# Patient Record
Sex: Female | Born: 1960 | Race: Black or African American | Hispanic: No | Smoking: Former smoker
Health system: Southern US, Community
[De-identification: ages and names within clinical notes are randomized; demographics above are authoritative.]

## PROBLEM LIST (undated history)

## (undated) DIAGNOSIS — B009 Herpesviral infection, unspecified: Secondary | ICD-10-CM

## (undated) DIAGNOSIS — Z8711 Personal history of peptic ulcer disease: Secondary | ICD-10-CM

## (undated) DIAGNOSIS — I10 Essential (primary) hypertension: Secondary | ICD-10-CM

## (undated) DIAGNOSIS — K219 Gastro-esophageal reflux disease without esophagitis: Secondary | ICD-10-CM

## (undated) DIAGNOSIS — R202 Paresthesia of skin: Secondary | ICD-10-CM

## (undated) DIAGNOSIS — Z5181 Encounter for therapeutic drug level monitoring: Secondary | ICD-10-CM

## (undated) DIAGNOSIS — L309 Dermatitis, unspecified: Secondary | ICD-10-CM

## (undated) DIAGNOSIS — Z8719 Personal history of other diseases of the digestive system: Secondary | ICD-10-CM

## (undated) DIAGNOSIS — N951 Menopausal and female climacteric states: Secondary | ICD-10-CM

## (undated) DIAGNOSIS — E119 Type 2 diabetes mellitus without complications: Secondary | ICD-10-CM

## (undated) DIAGNOSIS — G43009 Migraine without aura, not intractable, without status migrainosus: Secondary | ICD-10-CM

## (undated) DIAGNOSIS — T7840XA Allergy, unspecified, initial encounter: Secondary | ICD-10-CM

## (undated) DIAGNOSIS — Z91030 Bee allergy status: Secondary | ICD-10-CM

## (undated) DIAGNOSIS — G4761 Periodic limb movement disorder: Secondary | ICD-10-CM

## (undated) DIAGNOSIS — G4733 Obstructive sleep apnea (adult) (pediatric): Secondary | ICD-10-CM

## (undated) DIAGNOSIS — K5904 Chronic idiopathic constipation: Secondary | ICD-10-CM

## (undated) DIAGNOSIS — E785 Hyperlipidemia, unspecified: Secondary | ICD-10-CM

## (undated) DIAGNOSIS — F5104 Psychophysiologic insomnia: Secondary | ICD-10-CM

## (undated) DIAGNOSIS — M25569 Pain in unspecified knee: Secondary | ICD-10-CM

## (undated) DIAGNOSIS — E669 Obesity, unspecified: Secondary | ICD-10-CM

## (undated) DIAGNOSIS — M25473 Effusion, unspecified ankle: Secondary | ICD-10-CM

## (undated) HISTORY — DX: Herpesviral infection, unspecified: B00.9

## (undated) HISTORY — DX: Personal history of peptic ulcer disease: Z87.11

## (undated) HISTORY — PX: RETINAL TEAR REPAIR CRYOTHERAPY: SHX5304

## (undated) HISTORY — DX: Essential (primary) hypertension: I10

## (undated) HISTORY — PX: ABDOMINAL HYSTERECTOMY: SHX81

## (undated) HISTORY — DX: Dermatitis, unspecified: L30.9

## (undated) HISTORY — DX: Psychophysiologic insomnia: F51.04

## (undated) HISTORY — DX: Effusion, unspecified ankle: M25.473

## (undated) HISTORY — DX: Bee allergy status: Z91.030

## (undated) HISTORY — DX: Hyperlipidemia, unspecified: E78.5

## (undated) HISTORY — DX: Obesity, unspecified: E66.9

## (undated) HISTORY — DX: Obstructive sleep apnea (adult) (pediatric): G47.33

## (undated) HISTORY — DX: Type 2 diabetes mellitus without complications: E11.9

## (undated) HISTORY — DX: Allergy, unspecified, initial encounter: T78.40XA

## (undated) HISTORY — DX: Chronic idiopathic constipation: K59.04

## (undated) HISTORY — DX: Pain in unspecified knee: M25.569

## (undated) HISTORY — DX: Personal history of other diseases of the digestive system: Z87.19

## (undated) HISTORY — DX: Migraine without aura, not intractable, without status migrainosus: G43.009

## (undated) HISTORY — DX: Paresthesia of skin: R20.2

## (undated) HISTORY — DX: Menopausal and female climacteric states: N95.1

## (undated) HISTORY — DX: Encounter for therapeutic drug level monitoring: Z51.81

## (undated) HISTORY — DX: Gastro-esophageal reflux disease without esophagitis: K21.9

## (undated) HISTORY — DX: Periodic limb movement disorder: G47.61

---

## 2005-10-28 ENCOUNTER — Emergency Department: Payer: Self-pay | Admitting: Emergency Medicine

## 2005-12-25 ENCOUNTER — Ambulatory Visit: Payer: Self-pay | Admitting: Family Medicine

## 2007-03-05 ENCOUNTER — Ambulatory Visit: Payer: Self-pay | Admitting: Family Medicine

## 2007-03-11 ENCOUNTER — Ambulatory Visit: Payer: Self-pay | Admitting: Family Medicine

## 2008-10-11 ENCOUNTER — Ambulatory Visit: Payer: Self-pay | Admitting: Family Medicine

## 2009-12-27 LAB — HM COLONOSCOPY

## 2010-01-15 ENCOUNTER — Ambulatory Visit: Payer: Self-pay | Admitting: Gastroenterology

## 2011-02-08 ENCOUNTER — Ambulatory Visit: Payer: Self-pay | Admitting: Family Medicine

## 2012-07-10 ENCOUNTER — Ambulatory Visit: Payer: Self-pay | Admitting: Family Medicine

## 2012-10-23 ENCOUNTER — Ambulatory Visit: Payer: Self-pay | Admitting: Family Medicine

## 2012-11-17 ENCOUNTER — Ambulatory Visit: Payer: Self-pay | Admitting: Family Medicine

## 2012-11-18 LAB — HM MAMMOGRAPHY: HM Mammogram: NORMAL

## 2013-01-25 ENCOUNTER — Ambulatory Visit: Payer: Self-pay | Admitting: Family Medicine

## 2013-01-26 ENCOUNTER — Ambulatory Visit: Payer: Self-pay | Admitting: Family Medicine

## 2013-02-26 ENCOUNTER — Ambulatory Visit: Payer: Self-pay | Admitting: Family Medicine

## 2013-11-26 LAB — HM DIABETES EYE EXAM

## 2014-11-18 ENCOUNTER — Ambulatory Visit
Admit: 2014-11-18 | Disposition: A | Discharge status: Home / Self Care | Payer: Self-pay | Attending: Family Medicine | Admitting: Family Medicine

## 2014-11-18 LAB — HEMOGLOBIN A1C: Hgb A1c MFr Bld: 8.8 % — AB (ref 4.0–6.0)

## 2015-02-20 ENCOUNTER — Encounter: Payer: Self-pay | Admitting: Family Medicine

## 2015-02-20 DIAGNOSIS — G4700 Insomnia, unspecified: Secondary | ICD-10-CM | POA: Insufficient documentation

## 2015-02-20 DIAGNOSIS — L309 Dermatitis, unspecified: Secondary | ICD-10-CM | POA: Insufficient documentation

## 2015-02-20 DIAGNOSIS — E785 Hyperlipidemia, unspecified: Secondary | ICD-10-CM | POA: Insufficient documentation

## 2015-02-20 DIAGNOSIS — I10 Essential (primary) hypertension: Secondary | ICD-10-CM | POA: Insufficient documentation

## 2015-02-20 DIAGNOSIS — G43009 Migraine without aura, not intractable, without status migrainosus: Secondary | ICD-10-CM | POA: Insufficient documentation

## 2015-02-20 DIAGNOSIS — R202 Paresthesia of skin: Secondary | ICD-10-CM | POA: Insufficient documentation

## 2015-02-20 DIAGNOSIS — N951 Menopausal and female climacteric states: Secondary | ICD-10-CM | POA: Insufficient documentation

## 2015-02-20 DIAGNOSIS — J302 Other seasonal allergic rhinitis: Secondary | ICD-10-CM | POA: Insufficient documentation

## 2015-02-20 DIAGNOSIS — Z8719 Personal history of other diseases of the digestive system: Secondary | ICD-10-CM | POA: Insufficient documentation

## 2015-02-20 DIAGNOSIS — G4761 Periodic limb movement disorder: Secondary | ICD-10-CM | POA: Insufficient documentation

## 2015-02-20 DIAGNOSIS — Z8711 Personal history of peptic ulcer disease: Secondary | ICD-10-CM | POA: Insufficient documentation

## 2015-02-20 DIAGNOSIS — G4733 Obstructive sleep apnea (adult) (pediatric): Secondary | ICD-10-CM | POA: Insufficient documentation

## 2015-02-20 DIAGNOSIS — Z91030 Bee allergy status: Secondary | ICD-10-CM | POA: Insufficient documentation

## 2015-02-20 DIAGNOSIS — K5904 Chronic idiopathic constipation: Secondary | ICD-10-CM | POA: Insufficient documentation

## 2015-02-20 DIAGNOSIS — B009 Herpesviral infection, unspecified: Secondary | ICD-10-CM | POA: Insufficient documentation

## 2015-03-03 ENCOUNTER — Encounter: Payer: Self-pay | Admitting: Family Medicine

## 2015-03-03 ENCOUNTER — Encounter (INDEPENDENT_AMBULATORY_CARE_PROVIDER_SITE_OTHER): Payer: Self-pay

## 2015-03-03 ENCOUNTER — Ambulatory Visit (INDEPENDENT_AMBULATORY_CARE_PROVIDER_SITE_OTHER): Payer: 59 | Admitting: Family Medicine

## 2015-03-03 VITALS — BP 118/76 | HR 82 | Temp 98.1°F | Resp 14 | Ht 66.0 in | Wt 212.9 lb

## 2015-03-03 DIAGNOSIS — E668 Other obesity: Secondary | ICD-10-CM

## 2015-03-03 DIAGNOSIS — N951 Menopausal and female climacteric states: Secondary | ICD-10-CM | POA: Diagnosis not present

## 2015-03-03 DIAGNOSIS — G4733 Obstructive sleep apnea (adult) (pediatric): Secondary | ICD-10-CM

## 2015-03-03 DIAGNOSIS — G629 Polyneuropathy, unspecified: Secondary | ICD-10-CM

## 2015-03-03 DIAGNOSIS — E1142 Type 2 diabetes mellitus with diabetic polyneuropathy: Secondary | ICD-10-CM

## 2015-03-03 DIAGNOSIS — I10 Essential (primary) hypertension: Secondary | ICD-10-CM

## 2015-03-03 DIAGNOSIS — IMO0002 Reserved for concepts with insufficient information to code with codable children: Secondary | ICD-10-CM

## 2015-03-03 DIAGNOSIS — K5900 Constipation, unspecified: Secondary | ICD-10-CM

## 2015-03-03 DIAGNOSIS — K5904 Chronic idiopathic constipation: Secondary | ICD-10-CM

## 2015-03-03 DIAGNOSIS — G4700 Insomnia, unspecified: Secondary | ICD-10-CM | POA: Diagnosis not present

## 2015-03-03 DIAGNOSIS — E785 Hyperlipidemia, unspecified: Secondary | ICD-10-CM

## 2015-03-03 LAB — POCT GLYCOSYLATED HEMOGLOBIN (HGB A1C): Hemoglobin A1C: 7.7

## 2015-03-03 LAB — POCT UA - MICROALBUMIN: Microalbumin Ur, POC: 20 mg/L

## 2015-03-03 MED ORDER — METFORMIN HCL 1000 MG PO TABS: 1000.0000 mg | ORAL_TABLET | Freq: Two times a day (BID) | ORAL | Status: DC

## 2015-03-03 MED ORDER — IRBESARTAN-HYDROCHLOROTHIAZIDE 300-12.5 MG PO TABS: 1.0000 | ORAL_TABLET | Freq: Every day | ORAL | Status: DC

## 2015-03-03 MED ORDER — LIRAGLUTIDE 18 MG/3ML ~~LOC~~ SOPN: 1.2000 mg | PEN_INJECTOR | Freq: Every day | SUBCUTANEOUS | Status: DC

## 2015-03-03 MED ORDER — SERTRALINE HCL 50 MG PO TABS: 50.0000 mg | ORAL_TABLET | Freq: Every day | ORAL | Status: DC

## 2015-03-03 MED ORDER — POLYETHYLENE GLYCOL 3350 17 GM/SCOOP PO POWD: 17.0000 g | Freq: Two times a day (BID) | ORAL | Status: DC | PRN

## 2015-03-03 MED ORDER — AMLODIPINE BESYLATE 2.5 MG PO TABS: 2.5000 mg | ORAL_TABLET | ORAL | Status: DC | PRN

## 2015-03-03 MED ORDER — TEMAZEPAM 30 MG PO CAPS: 30.0000 mg | ORAL_CAPSULE | Freq: Every day | ORAL | Status: DC

## 2015-03-03 MED ORDER — ATORVASTATIN CALCIUM 40 MG PO TABS: 40.0000 mg | ORAL_TABLET | Freq: Every day | ORAL | Status: DC

## 2015-03-03 NOTE — Progress Notes (Signed)
Name: Joyce Blackburn   MRN: [id number]    DOB: 01-10-1961   Date:03/03/2015       Progress Note  Subjective  Chief Complaint  Chief Complaint  Patient presents with  . Medication Refill  . Diabetes    Checks BS once daily Low-135 High-185 (pt not currently taking jardiance b/c needs mail order rx also states she broke out in a rash)  . Hypertension  . Hyperlipidemia    HPI  DMII with peripheral neuropathy: hgbA1C is down from 8.8 to 7.7, she has been more compliant with her diet, taking medications except for Jardiance because it was causing a side effect ( a bump and sometimes a bruise ), she has intermittent pain on her feet and paresthesias but not daily and does not want to take medications for it.  She denies polyphagia , polydipsia or polyuria.   HTN: taking medications as prescribed, gets dizzy only when having her hair washed at a salon, otherwise no side effects, no chest pain or palpation.  OSA: she states she uses CPAP every night, all night and denies waking up with headache or interrupted sleep.   Hyperlipidemia: taking Atorvastatin and denies side effects  Constipation: she states that over the past month has noticed diarrhea after meals, that is a change in bowel movements, no blood in stools or mucus, occasionally has cramping , she is up to date with her colonoscopy and negative for polyps.   Insomnia: taking Temazepam and symptoms have been controlled  Menopausal symptoms: started on Zoloft 50mg  and initially was feeling very hungry, but is doing well now, mood is better controlled also improved her hot flashes  Obesity: she has decreased soda intake, cutting down on carbohydrates, but still not exercising, she tried going up on dose of Victoza but could not tolerate.   Patient Active Problem List   Diagnosis Date Noted  . Diabetic polyneuropathy associated with type 2 diabetes mellitus 03/03/2015  . Peripheral neuropathy 03/03/2015  . Allergic to bees 02/20/2015   . Benign hypertension 02/20/2015  . Insomnia, persistent 02/20/2015  . Chronic idiopathic constipation 02/20/2015  . Dyslipidemia 02/20/2015  . Dermatitis, eczematoid 02/20/2015  . Gastro-esophageal reflux disease without esophagitis 02/20/2015  . H/O gastric ulcer 02/20/2015  . Herpes 02/20/2015  . Migraine without aura and responsive to treatment 02/20/2015  . Adult BMI 30+ 02/20/2015  . Obstructive apnea 02/20/2015  . Paresthesia of arm 02/20/2015  . Periodic limb movement 02/20/2015  . Allergic rhinitis, seasonal 02/20/2015  . Menopausal symptom 02/20/2015    Past Surgical History  Procedure Laterality Date  . Abdominal hysterectomy      Family History  Problem Relation Age of Onset  . Mental illness Mother   . Diabetes Mother   . CVA Mother   . Schizophrenia Mother   . Diabetes Sister   . Hypertension Sister   . Diabetes Brother   . Mental illness Brother   . Bipolar disorder Brother   . Schizophrenia Brother     History   Social History  . Marital Status: Married    Spouse Name: N/A  . Number of Children: N/A  . Years of Education: N/A   Occupational History  . Not on file.   Social History Main Topics  . Smoking status: Former Smoker -- 15 years    Quit date: 07/29/2004  . Smokeless tobacco: Not on file  . Alcohol Use: No  . Drug Use: No  . Sexual Activity: Yes   Other Topics  Concern  . Not on file   Social History Narrative     Current outpatient prescriptions:  .  EPINEPHRINE, ANAPHYLAXIS THERAPY AGENTS,, EPIPEN 2-PAK, 0.3MG /0.3ML (Injection Device)  1 Device use as directed for 30 days  Quantity: 2;  Refills: 1   Ordered :06-Mar-2012  Alba Cory MD;  Started 06-Mar-2012 Active Comments: DX: 989.5, Disp: , Rfl:  .  fluticasone (FLONASE) 50 MCG/ACT nasal spray, Place 2 sprays into the nose as needed., Disp: , Rfl:  .  frovatriptan (FROVA) 2.5 MG tablet, Take 1 tablet by mouth as needed., Disp: , Rfl:  .  Liraglutide (VICTOZA) 18 MG/3ML  SOPN, Inject 0.2 mLs (1.2 mg total) into the skin daily., Disp: 18 mL, Rfl: 1 .  metFORMIN (GLUCOPHAGE) 1000 MG tablet, Take 1 tablet (1,000 mg total) by mouth 2 (two) times daily with a meal., Disp: 180 tablet, Rfl: 1 .  triamcinolone lotion (KENALOG) 0.1 %, TRIAMCINOLONE ACETONIDE, 0.1% (External Lotion)  1 (one) Lotion Lotion apply to rash twice daily for 30 days  Quantity: 30;  Refills: 0   Ordered :24-January-2014  Alba Cory MD;  Started 24-January-2014 Active, Disp: , Rfl:  .  amLODipine (NORVASC) 2.5 MG tablet, Take 1 tablet (2.5 mg total) by mouth every 4 (four) hours as needed., Disp: 90 tablet, Rfl: 1 .  atorvastatin (LIPITOR) 40 MG tablet, Take 1 tablet (40 mg total) by mouth daily., Disp: 90 tablet, Rfl: 1 .  irbesartan-hydrochlorothiazide (AVALIDE) 300-12.5 MG per tablet, Take 1 tablet by mouth daily., Disp: 90 tablet, Rfl: 1 .  sertraline (ZOLOFT) 50 MG tablet, Take 1 tablet (50 mg total) by mouth daily., Disp: 90 tablet, Rfl: 1 .  temazepam (RESTORIL) 30 MG capsule, Take 1 capsule (30 mg total) by mouth daily., Disp: 90 capsule, Rfl: 1  Allergies  Allergen Reactions  . Ace Inhibitors     cough  . Bee Venom     Bee  . Dapagliflozin     hematuria     ROS  Constitutional: Negative for fever or significant weight change.  Respiratory: Negative for cough and shortness of breath.   Cardiovascular: Negative for chest pain or palpitations.  Gastrointestinal: Negative for abdominal pain, no bowel changes.  Musculoskeletal: Negative for gait problem or joint swelling.  Skin: Negative for rash.  Neurological: Negative for dizziness, intermittent  headache.  No other specific complaints in a complete review of systems (except as listed in HPI above).  Objective  Filed Vitals:   03/03/15 0811  BP: 118/76  Pulse: 82  Temp: 98.1 F (36.7 C)  TempSrc: Oral  Resp: 14  Height: 5\' 6"  (1.676 m)  Weight: 212 lb 14.4 oz (96.571 kg)  SpO2: 96%    Body mass index is 34.38  kg/(m^2).  Physical Exam  Constitutional: Patient appears well-developed and well-nourished and obese. No distress.  HENT: Head: Normocephalic and atraumatic. Ears: B TMs ok, no erythema or effusion; Nose: Nose normal. Mouth/Throat: Oropharynx is clear and moist. No oropharyngeal exudate.  Eyes: Conjunctivae and EOM are normal. Pupils are equal, round, and reactive to light. No scleral icterus.  Neck: Normal range of motion. Neck supple. No JVD present. No thyromegaly present.  Cardiovascular: Normal rate, regular rhythm and normal heart sounds.  No murmur heard. No BLE edema. Pulmonary/Chest: Effort normal and breath sounds normal. No respiratory distress. Abdominal: Soft. , no distension. There is no tenderness. no masses Musculoskeletal: Normal range of motion, no joint effusions. No gross deformities Neurological: he is alert and oriented to  person, place, and time. No cranial nerve deficit. Coordination, balance, strength, speech and gait are normal.  Skin: Skin is warm and dry. No rash noted. No erythema. Corn formation on feet Psychiatric: Patient has a normal mood and affect. behavior is normal. Judgment and thought content normal.  Recent Results (from the past 2160 hour(s))  POCT HgB A1C     Status: Abnormal   Collection Time: 03/03/15  8:12 AM  Result Value Ref Range   Hemoglobin A1C 7.7   POCT UA - Microalbumin     Status: Abnormal   Collection Time: 03/03/15  8:12 AM  Result Value Ref Range   Microalbumin Ur, POC 20 mg/L   Creatinine, POC  mg/dL   Albumin/Creatinine Ratio, Urine, POC       Diabetic Foot Exam - Simple   Simple Foot Form  Visual Inspection  See comments:  Yes  Sensation Testing  Intact to touch and monofilament testing bilaterally:  Yes  Pulse Check  Posterior Tibialis and Dorsalis pulse intact bilaterally:  Yes  Comments  Corn formation      PHQ2/9: Depression screen PHQ 2/9 03/03/2015  Decreased Interest 0  Down, Depressed, Hopeless 0  PHQ -  2 Score 0     Fall Risk: Fall Risk  03/03/2015  Falls in the past year? No      Assessment & Plan  1. Diabetic polyneuropathy associated with type 2 diabetes mellitus She does not want medication for polyneuropathy, increase dose of Metformin to twice daily, she has been having diarrhea but likely from soiling/constipation - POCT HgB A1C - POCT UA - Microalbumin - metFORMIN (GLUCOPHAGE) 1000 MG tablet; Take 1 tablet (1,000 mg total) by mouth 2 (two) times daily with a meal.  Dispense: 180 tablet; Refill: 1 - Liraglutide (VICTOZA) 18 MG/3ML SOPN; Inject 0.2 mLs (1.2 mg total) into the skin daily.  Dispense: 18 mL; Refill: 1  2. Dyslipidemia  - atorvastatin (LIPITOR) 40 MG tablet; Take 1 tablet (40 mg total) by mouth daily.  Dispense: 90 tablet; Refill: 1  3. Adult BMI 30+ Discussed diet and exercise  4. Obstructive apnea Continue CPAP machine daily   5. Peripheral neuropathy stable  6. Benign hypertension At goal  - irbesartan-hydrochlorothiazide (AVALIDE) 300-12.5 MG per tablet; Take 1 tablet by mouth daily.  Dispense: 90 tablet; Refill: 1 - amLODipine (NORVASC) 2.5 MG tablet; Take 1 tablet (2.5 mg total) by mouth every 4 (four) hours as needed.  Dispense: 90 tablet; Refill: 1  7. Menopausal symptom Doing well on medication  - sertraline (ZOLOFT) 50 MG tablet; Take 1 tablet (50 mg total) by mouth daily.  Dispense: 90 tablet; Refill: 1  8. Insomnia, persistent  - temazepam (RESTORIL) 30 MG capsule; Take 1 capsule (30 mg total) by mouth daily.  Dispense: 90 capsule; Refill: 1   9. Chronic idiopathic constipation Try Miralax to regulate constipation and return sooner if no resolution - polyethylene glycol powder (GLYCOLAX/MIRALAX) powder; Take 17 g by mouth 2 (two) times daily as needed.  Dispense: 3350 g; Refill: 1

## 2015-03-07 ENCOUNTER — Other Ambulatory Visit: Payer: Self-pay

## 2015-03-07 DIAGNOSIS — I10 Essential (primary) hypertension: Secondary | ICD-10-CM

## 2015-03-07 DIAGNOSIS — E1142 Type 2 diabetes mellitus with diabetic polyneuropathy: Secondary | ICD-10-CM

## 2015-03-07 MED ORDER — METFORMIN HCL ER (OSM) 1000 MG PO TB24: 1000.0000 mg | ORAL_TABLET | Freq: Two times a day (BID) | ORAL | Status: DC

## 2015-03-07 MED ORDER — AMLODIPINE BESYLATE 2.5 MG PO TABS: 2.5000 mg | ORAL_TABLET | Freq: Every day | ORAL | Status: DC

## 2015-03-23 ENCOUNTER — Ambulatory Visit (INDEPENDENT_AMBULATORY_CARE_PROVIDER_SITE_OTHER): Payer: 59 | Admitting: Family Medicine

## 2015-03-23 ENCOUNTER — Encounter: Payer: Self-pay | Admitting: Family Medicine

## 2015-03-23 ENCOUNTER — Ambulatory Visit
Admission: RE | Admit: 2015-03-23 | Discharge: 2015-03-23 | Disposition: A | Discharge status: Home / Self Care | Payer: 59 | Source: Ambulatory Visit | Attending: Family Medicine | Admitting: Family Medicine

## 2015-03-23 VITALS — BP 120/72 | HR 101 | Temp 99.5°F | Resp 16 | Ht 67.0 in | Wt 212.1 lb

## 2015-03-23 DIAGNOSIS — R0902 Hypoxemia: Secondary | ICD-10-CM | POA: Diagnosis present

## 2015-03-23 DIAGNOSIS — J189 Pneumonia, unspecified organism: Secondary | ICD-10-CM | POA: Diagnosis not present

## 2015-03-23 DIAGNOSIS — R05 Cough: Secondary | ICD-10-CM

## 2015-03-23 DIAGNOSIS — R0602 Shortness of breath: Secondary | ICD-10-CM

## 2015-03-23 DIAGNOSIS — R059 Cough, unspecified: Secondary | ICD-10-CM

## 2015-03-23 MED ORDER — AMOXICILLIN-POT CLAVULANATE 875-125 MG PO TABS: 1.0000 | ORAL_TABLET | Freq: Two times a day (BID) | ORAL | Status: DC

## 2015-03-23 MED ORDER — ALBUTEROL SULFATE HFA 108 (90 BASE) MCG/ACT IN AERS: 2.0000 | INHALATION_SPRAY | Freq: Four times a day (QID) | RESPIRATORY_TRACT | Status: DC | PRN

## 2015-03-23 MED ORDER — MOMETASONE FURO-FORMOTEROL FUM 100-5 MCG/ACT IN AERO: 2.0000 | INHALATION_SPRAY | Freq: Two times a day (BID) | RESPIRATORY_TRACT | Status: DC

## 2015-03-23 MED ORDER — HYDROCOD POLST-CPM POLST ER 10-8 MG/5ML PO SUER: 5.0000 mL | Freq: Two times a day (BID) | ORAL | Status: DC | PRN

## 2015-03-23 NOTE — Progress Notes (Signed)
Name: Joyce Blackburn   MRN: [id number]    DOB: 24-Sep-1960   Date:03/23/2015       Progress Note  Subjective  Chief Complaint  Chief Complaint  Patient presents with  . URI    onset 1 week, cough, sore throat, headache, SOB, wheezing    HPI  Cough/SOB/hypoxemia: she started with cold symptoms one week ago, a couple days later symptoms of cough, wheezing and SOB got worse. She missed work yesterday. She states cough is wet, and today she started to expectorate green sputum.  She has chills, lack of appetite and feels tired   Patient Active Problem List   Diagnosis Date Noted  . Diabetic polyneuropathy associated with type 2 diabetes mellitus 03/03/2015  . Peripheral neuropathy 03/03/2015  . Allergic to bees 02/20/2015  . Benign hypertension 02/20/2015  . Insomnia, persistent 02/20/2015  . Chronic idiopathic constipation 02/20/2015  . Dyslipidemia 02/20/2015  . Dermatitis, eczematoid 02/20/2015  . Gastro-esophageal reflux disease without esophagitis 02/20/2015  . H/O gastric ulcer 02/20/2015  . Herpes 02/20/2015  . Migraine without aura and responsive to treatment 02/20/2015  . Adult BMI 30+ 02/20/2015  . Obstructive apnea 02/20/2015  . Paresthesia of arm 02/20/2015  . Periodic limb movement 02/20/2015  . Allergic rhinitis, seasonal 02/20/2015  . Menopausal symptom 02/20/2015    Past Surgical History  Procedure Laterality Date  . Abdominal hysterectomy      Family History  Problem Relation Age of Onset  . Mental illness Mother   . Diabetes Mother   . CVA Mother   . Schizophrenia Mother   . Diabetes Sister   . Hypertension Sister   . Diabetes Brother   . Mental illness Brother   . Bipolar disorder Brother   . Schizophrenia Brother     Social History   Social History  . Marital Status: Married    Spouse Name: N/A  . Number of Children: N/A  . Years of Education: N/A   Occupational History  . Not on file.   Social History Main Topics  . Smoking status:  Former Smoker -- 15 years    Quit date: 07/29/2004  . Smokeless tobacco: Not on file  . Alcohol Use: No  . Drug Use: No  . Sexual Activity: Yes   Other Topics Concern  . Not on file   Social History Narrative     Current outpatient prescriptions:  .  amLODipine (NORVASC) 2.5 MG tablet, Take 1 tablet (2.5 mg total) by mouth daily., Disp: 90 tablet, Rfl: 1 .  amoxicillin-clavulanate (AUGMENTIN) 875-125 MG per tablet, Take 1 tablet by mouth 2 (two) times daily., Disp: 20 tablet, Rfl: 0 .  atorvastatin (LIPITOR) 40 MG tablet, Take 1 tablet (40 mg total) by mouth daily., Disp: 90 tablet, Rfl: 1 .  chlorpheniramine-HYDROcodone (TUSSIONEX PENNKINETIC ER) 10-8 MG/5ML SUER, Take 5 mLs by mouth every 12 (twelve) hours as needed for cough., Disp: 140 mL, Rfl: 0 .  EPINEPHRINE, ANAPHYLAXIS THERAPY AGENTS,, EPIPEN 2-PAK, 0.3MG /0.3ML (Injection Device)  1 Device use as directed for 30 days  Quantity: 2;  Refills: 1   Ordered :06-Mar-2012  Alba Cory MD;  Started 06-Mar-2012 Active Comments: DX: 989.5, Disp: , Rfl:  .  frovatriptan (FROVA) 2.5 MG tablet, Take 1 tablet by mouth as needed., Disp: , Rfl:  .  irbesartan-hydrochlorothiazide (AVALIDE) 300-12.5 MG per tablet, Take 1 tablet by mouth daily., Disp: 90 tablet, Rfl: 1 .  Liraglutide (VICTOZA) 18 MG/3ML SOPN, Inject 0.2 mLs (1.2 mg total) into the  skin daily., Disp: 18 mL, Rfl: 1 .  metformin (FORTAMET) 1000 MG (OSM) 24 hr tablet, Take 1 tablet (1,000 mg total) by mouth 2 (two) times daily with a meal., Disp: 180 tablet, Rfl: 1 .  mometasone-formoterol (DULERA) 100-5 MCG/ACT AERO, Inhale 2 puffs into the lungs 2 (two) times daily., Disp: 1 Inhaler, Rfl: 0 .  polyethylene glycol powder (GLYCOLAX/MIRALAX) powder, Take 17 g by mouth 2 (two) times daily as needed., Disp: 3350 g, Rfl: 1 .  sertraline (ZOLOFT) 50 MG tablet, Take 1 tablet (50 mg total) by mouth daily., Disp: 90 tablet, Rfl: 1 .  temazepam (RESTORIL) 30 MG capsule, Take 1 capsule (30 mg  total) by mouth daily., Disp: 90 capsule, Rfl: 1 .  triamcinolone lotion (KENALOG) 0.1 %, TRIAMCINOLONE ACETONIDE, 0.1% (External Lotion)  1 (one) Lotion Lotion apply to rash twice daily for 30 days  Quantity: 30;  Refills: 0   Ordered :24-January-2014  Alba Cory MD;  Mora Appl 24-January-2014 Active, Disp: , Rfl:   Allergies  Allergen Reactions  . Ace Inhibitors     cough  . Bee Venom     Bee  . Dapagliflozin     hematuria     ROS  Constitutional: Negative for fever or weight change.  Respiratory: Positive  for cough and shortness of breath.   Cardiovascular: Positive  for chest pain ( from coughing ) or palpitations.  Gastrointestinal: Negative for abdominal pain, no bowel changes.  Musculoskeletal: Negative for gait problem or joint swelling.  Skin: Negative for rash.  Neurological: Negative for dizziness or headache.  No other specific complaints in a complete review of systems (except as listed in HPI above).  Objective  Filed Vitals:   03/23/15 1531  BP: 120/72  Pulse: 101  Temp: 99.5 F (37.5 C)  TempSrc: Oral  Resp: 16  Height: 5\' 7"  (1.702 m)  Weight: 212 lb 1.6 oz (96.208 kg)  SpO2: 89%    Body mass index is 33.21 kg/(m^2).  Physical Exam  Constitutional: Patient appears well-developed and well-nourished. Obese No distress.  HEENT: head atraumatic, normocephalic, pupils equal and reactive to light, ears TM normal , neck supple, throat within normal limits Cardiovascular: Normal rate, regular rhythm and normal heart sounds.  No murmur heard. No BLE edema. Pulmonary/Chest: Effort normal bilateral posterior end expiratory wheezing, no crackles Abdominal: Soft.  There is no tenderness. Psychiatric: Patient has a normal mood and affect. behavior is normal. Judgment and thought content normal.  Recent Results (from the past 2160 hour(s))  POCT HgB A1C     Status: Abnormal   Collection Time: 03/03/15  8:12 AM  Result Value Ref Range   Hemoglobin A1C 7.7   POCT  UA - Microalbumin     Status: Abnormal   Collection Time: 03/03/15  8:12 AM  Result Value Ref Range   Microalbumin Ur, POC 20 mg/L   Creatinine, POC  mg/dL   Albumin/Creatinine Ratio, Urine, POC       PHQ2/9: Depression screen PHQ 2/9 03/03/2015  Decreased Interest 0  Down, Depressed, Hopeless 0  PHQ - 2 Score 0     Fall Risk: Fall Risk  03/03/2015  Falls in the past year? No      Assessment & Plan  1. Cough She will need a spirometry on her next visit, used to smoke, but quit in 2006, second episode of possible bronchitis/pneumonia since 07/2014 We will avoid oral steroids because of her DM - DG Chest 2 View; Future - CBC with  Differential/Platelet - amoxicillin-clavulanate (AUGMENTIN) 875-125 MG per tablet; Take 1 tablet by mouth 2 (two) times daily.  Dispense: 20 tablet; Refill: 0 - chlorpheniramine-HYDROcodone (TUSSIONEX PENNKINETIC ER) 10-8 MG/5ML SUER; Take 5 mLs by mouth every 12 (twelve) hours as needed for cough.  Dispense: 140 mL; Refill: 0 - mometasone-formoterol (DULERA) 100-5 MCG/ACT AERO; Inhale 2 puffs into the lungs 2 (two) times daily.  Dispense: 1 Inhaler; Refill: 0  2. SOB (shortness of breath)  - DG Chest 2 View; Future - CBC with Differential/Platelet - amoxicillin-clavulanate (AUGMENTIN) 875-125 MG per tablet; Take 1 tablet by mouth 2 (two) times daily.  Dispense: 20 tablet; Refill: 0 - chlorpheniramine-HYDROcodone (TUSSIONEX PENNKINETIC ER) 10-8 MG/5ML SUER; Take 5 mLs by mouth every 12 (twelve) hours as needed for cough.  Dispense: 140 mL; Refill: 0  3. Hypoxemia Neb treatment given to her during the visit  - DG Chest 2 View; Future - CBC with Differential/Platelet - amoxicillin-clavulanate (AUGMENTIN) 875-125 MG per tablet; Take 1 tablet by mouth 2 (two) times daily.  Dispense: 20 tablet; Refill: 0 - chlorpheniramine-HYDROcodone (TUSSIONEX PENNKINETIC ER) 10-8 MG/5ML SUER; Take 5 mLs by mouth every 12 (twelve) hours as needed for cough.  Dispense:  140 mL; Refill: 0

## 2015-03-24 ENCOUNTER — Other Ambulatory Visit: Payer: Self-pay | Admitting: Family Medicine

## 2015-03-24 DIAGNOSIS — R05 Cough: Secondary | ICD-10-CM

## 2015-03-24 DIAGNOSIS — R0602 Shortness of breath: Secondary | ICD-10-CM

## 2015-03-24 DIAGNOSIS — R059 Cough, unspecified: Secondary | ICD-10-CM

## 2015-03-24 DIAGNOSIS — R0902 Hypoxemia: Secondary | ICD-10-CM

## 2015-03-24 LAB — CBC WITH DIFFERENTIAL/PLATELET
Basophils Absolute: 0 10*3/uL (ref 0.0–0.2)
Basos: 0 %
EOS (ABSOLUTE): 0.2 10*3/uL (ref 0.0–0.4)
Eos: 1 %
Hematocrit: 35.2 % (ref 34.0–46.6)
Hemoglobin: 12 g/dL (ref 11.1–15.9)
Immature Grans (Abs): 0 10*3/uL (ref 0.0–0.1)
Immature Granulocytes: 0 %
LYMPHS ABS: 3.3 10*3/uL — AB (ref 0.7–3.1)
Lymphs: 32 %
MCH: 27.1 pg (ref 26.6–33.0)
MCHC: 34.1 g/dL (ref 31.5–35.7)
MCV: 80 fL (ref 79–97)
MONOS ABS: 1.1 10*3/uL — AB (ref 0.1–0.9)
Monocytes: 10 %
Neutrophils Absolute: 5.8 10*3/uL (ref 1.4–7.0)
Neutrophils: 57 %
PLATELETS: 456 10*3/uL — AB (ref 150–379)
RBC: 4.43 x10E6/uL (ref 3.77–5.28)
RDW: 14.2 % (ref 12.3–15.4)
WBC: 10.4 10*3/uL (ref 3.4–10.8)

## 2015-03-24 MED ORDER — ALBUTEROL SULFATE HFA 108 (90 BASE) MCG/ACT IN AERS: 2.0000 | INHALATION_SPRAY | Freq: Four times a day (QID) | RESPIRATORY_TRACT | Status: DC | PRN

## 2015-03-24 MED ORDER — AMOXICILLIN-POT CLAVULANATE 875-125 MG PO TABS: 1.0000 | ORAL_TABLET | Freq: Two times a day (BID) | ORAL | Status: DC

## 2015-03-27 NOTE — Progress Notes (Signed)
Patient notified

## 2015-06-02 ENCOUNTER — Encounter: Payer: Self-pay | Admitting: Family Medicine

## 2015-06-02 ENCOUNTER — Ambulatory Visit
Admission: RE | Admit: 2015-06-02 | Discharge: 2015-06-02 | Disposition: A | Discharge status: Home / Self Care | Payer: 59 | Source: Ambulatory Visit | Attending: Family Medicine | Admitting: Family Medicine

## 2015-06-02 ENCOUNTER — Ambulatory Visit (INDEPENDENT_AMBULATORY_CARE_PROVIDER_SITE_OTHER): Payer: 59 | Admitting: Family Medicine

## 2015-06-02 VITALS — BP 116/72 | HR 104 | Temp 98.5°F | Resp 18 | Ht 67.0 in | Wt 209.8 lb

## 2015-06-02 DIAGNOSIS — J189 Pneumonia, unspecified organism: Secondary | ICD-10-CM

## 2015-06-02 DIAGNOSIS — E785 Hyperlipidemia, unspecified: Secondary | ICD-10-CM

## 2015-06-02 DIAGNOSIS — I10 Essential (primary) hypertension: Secondary | ICD-10-CM

## 2015-06-02 DIAGNOSIS — E1142 Type 2 diabetes mellitus with diabetic polyneuropathy: Secondary | ICD-10-CM

## 2015-06-02 DIAGNOSIS — R938 Abnormal findings on diagnostic imaging of other specified body structures: Secondary | ICD-10-CM | POA: Diagnosis present

## 2015-06-02 DIAGNOSIS — Z79899 Other long term (current) drug therapy: Secondary | ICD-10-CM | POA: Diagnosis not present

## 2015-06-02 DIAGNOSIS — R5383 Other fatigue: Secondary | ICD-10-CM | POA: Diagnosis not present

## 2015-06-02 DIAGNOSIS — G4700 Insomnia, unspecified: Secondary | ICD-10-CM | POA: Diagnosis not present

## 2015-06-02 DIAGNOSIS — G4733 Obstructive sleep apnea (adult) (pediatric): Secondary | ICD-10-CM | POA: Diagnosis not present

## 2015-06-02 DIAGNOSIS — R0602 Shortness of breath: Secondary | ICD-10-CM

## 2015-06-02 DIAGNOSIS — R05 Cough: Secondary | ICD-10-CM | POA: Diagnosis not present

## 2015-06-02 DIAGNOSIS — Z23 Encounter for immunization: Secondary | ICD-10-CM | POA: Diagnosis not present

## 2015-06-02 DIAGNOSIS — R9389 Abnormal findings on diagnostic imaging of other specified body structures: Secondary | ICD-10-CM

## 2015-06-02 DIAGNOSIS — R0902 Hypoxemia: Secondary | ICD-10-CM | POA: Diagnosis not present

## 2015-06-02 DIAGNOSIS — R059 Cough, unspecified: Secondary | ICD-10-CM

## 2015-06-02 LAB — POCT GLYCOSYLATED HEMOGLOBIN (HGB A1C): HEMOGLOBIN A1C: 9.8

## 2015-06-02 MED ORDER — ARMODAFINIL 150 MG PO TABS: 150.0000 mg | ORAL_TABLET | Freq: Every day | ORAL | Status: DC

## 2015-06-02 MED ORDER — LEVOFLOXACIN 750 MG PO TABS: 750.0000 mg | ORAL_TABLET | Freq: Every day | ORAL | Status: DC

## 2015-06-02 MED ORDER — HYDROCOD POLST-CPM POLST ER 10-8 MG/5ML PO SUER: 5.0000 mL | Freq: Two times a day (BID) | ORAL | Status: DC | PRN

## 2015-06-02 MED ORDER — INSULIN DEGLUDEC 200 UNIT/ML ~~LOC~~ SOPN: 10.0000 [IU] | PEN_INJECTOR | Freq: Every day | SUBCUTANEOUS | Status: DC

## 2015-06-02 MED ORDER — ALBUTEROL SULFATE (2.5 MG/3ML) 0.083% IN NEBU
2.5000 mg | INHALATION_SOLUTION | Freq: Once | RESPIRATORY_TRACT | Status: AC
  Administered 2015-06-02: 2.5 mg via RESPIRATORY_TRACT

## 2015-06-02 NOTE — Progress Notes (Signed)
Name: Joyce Blackburn   MRN: [id number]    DOB: Aug 21, 1960   Date:06/02/2015       Progress Note  Subjective  Chief Complaint  Chief Complaint  Patient presents with  . Medication Refill    3 month F/U  . Hot Flashes    Has not had nearly the amount as she was before taking medication  . Hypertension    Well controlled  . Diabetes    Does not check sugar at home  . Hyperlipidemia    Having muscle cramps in bilateral legs start the past couple of weeks.  . Insomnia    Total 5 hours nightly-working well with medication  . Cough    2 month still having with the SOB occasionally, and green mucus.    HPI  Hot Flashes: she states Zoloft has improved has symptoms, still has some symptoms but not as intense or as frequent as it used to be. Denies side effects of medications  HTN: taking medications, no side effects, no dizziness, no chest pain or palpitations. BP is at goal  DMII: not checking fsbs at home, machine does not have a battery.  She has been trying to be compliant with her diet, drinking more Pepsi lately a 16 ounce bottle, used to drink three daily . She denies polydipsia or polyuria. Occasionally feels hungry at night.   Hyperlipidemia: patient is taking Atorvastatin and denies side effects of medications  Insomnia: taking Temazepam and it helps her fall and stay asleep, no side effects.   Cough: she was treated for CAP in 02/2015, she felt better, and stopped coughing but over the past 3 weeks she has been sick again, got a cold from her grandaughter. Cough is day and night, during the day it is productive but dry at night. She denies wheezing or fever. Feeling more tired than usual  OSA: using CPAP machine every night, and it controls her snoring, she still feels tired during the day.   Patient Active Problem List   Diagnosis Date Noted  . Diabetic polyneuropathy associated with type 2 diabetes mellitus (HCC) 03/03/2015  . Peripheral neuropathy (HCC) 03/03/2015  .  Allergic to bees 02/20/2015  . Benign hypertension 02/20/2015  . Insomnia, persistent 02/20/2015  . Chronic idiopathic constipation 02/20/2015  . Dyslipidemia 02/20/2015  . Dermatitis, eczematoid 02/20/2015  . Gastro-esophageal reflux disease without esophagitis 02/20/2015  . H/O gastric ulcer 02/20/2015  . Herpes 02/20/2015  . Migraine without aura and responsive to treatment 02/20/2015  . Adult BMI 30+ 02/20/2015  . Obstructive apnea 02/20/2015  . Paresthesia of arm 02/20/2015  . Periodic limb movement 02/20/2015  . Allergic rhinitis, seasonal 02/20/2015  . Menopausal symptom 02/20/2015    Past Surgical History  Procedure Laterality Date  . Abdominal hysterectomy      Family History  Problem Relation Age of Onset  . Mental illness Mother   . Diabetes Mother   . CVA Mother   . Schizophrenia Mother   . Diabetes Sister   . Hypertension Sister   . Diabetes Brother   . Mental illness Brother   . Bipolar disorder Brother   . Schizophrenia Brother     Social History   Social History  . Marital Status: Married    Spouse Name: N/A  . Number of Children: N/A  . Years of Education: N/A   Occupational History  . Not on file.   Social History Main Topics  . Smoking status: Former Smoker -- 0.10 packs/day for 15  years    Quit date: 07/29/2004  . Smokeless tobacco: Not on file  . Alcohol Use: No  . Drug Use: No  . Sexual Activity: Yes   Other Topics Concern  . Not on file   Social History Narrative     Current outpatient prescriptions:  .  albuterol (PROVENTIL HFA;VENTOLIN HFA) 108 (90 BASE) MCG/ACT inhaler, Inhale 2 puffs into the lungs every 6 (six) hours as needed for wheezing or shortness of breath., Disp: 1 Inhaler, Rfl: 0 .  amLODipine (NORVASC) 2.5 MG tablet, Take 1 tablet (2.5 mg total) by mouth daily., Disp: 90 tablet, Rfl: 1 .  atorvastatin (LIPITOR) 40 MG tablet, Take 1 tablet (40 mg total) by mouth daily., Disp: 90 tablet, Rfl: 1 .  EPINEPHRINE,  ANAPHYLAXIS THERAPY AGENTS,, EPIPEN 2-PAK, 0.3MG /0.3ML (Injection Device)  1 Device use as directed for 30 days  Quantity: 2;  Refills: 1   Ordered :06-Mar-2012  Alba Cory MD;  Started 06-Mar-2012 Active Comments: DX: 989.5, Disp: , Rfl:  .  irbesartan-hydrochlorothiazide (AVALIDE) 300-12.5 MG per tablet, Take 1 tablet by mouth daily., Disp: 90 tablet, Rfl: 1 .  Liraglutide (VICTOZA) 18 MG/3ML SOPN, Inject 0.2 mLs (1.2 mg total) into the skin daily., Disp: 18 mL, Rfl: 1 .  metformin (FORTAMET) 1000 MG (OSM) 24 hr tablet, Take 1 tablet (1,000 mg total) by mouth 2 (two) times daily with a meal., Disp: 180 tablet, Rfl: 1 .  sertraline (ZOLOFT) 50 MG tablet, Take 1 tablet (50 mg total) by mouth daily., Disp: 90 tablet, Rfl: 1 .  temazepam (RESTORIL) 30 MG capsule, Take 1 capsule (30 mg total) by mouth daily., Disp: 90 capsule, Rfl: 1 .  triamcinolone lotion (KENALOG) 0.1 %, TRIAMCINOLONE ACETONIDE, 0.1% (External Lotion)  1 (one) Lotion Lotion apply to rash twice daily for 30 days  Quantity: 30;  Refills: 0   Ordered :24-January-2014  Alba Cory MD;  Mora Appl 24-January-2014 Active, Disp: , Rfl:  .  Armodafinil 150 MG tablet, Take 1 tablet (150 mg total) by mouth daily., Disp: 30 tablet, Rfl: 2 .  chlorpheniramine-HYDROcodone (TUSSIONEX PENNKINETIC ER) 10-8 MG/5ML SUER, Take 5 mLs by mouth every 12 (twelve) hours as needed for cough., Disp: 140 mL, Rfl: 0 .  frovatriptan (FROVA) 2.5 MG tablet, Take 1 tablet by mouth as needed., Disp: , Rfl:  .  polyethylene glycol powder (GLYCOLAX/MIRALAX) powder, Take 17 g by mouth 2 (two) times daily as needed. (Patient not taking: Reported on 06/02/2015), Disp: 3350 g, Rfl: 1  Allergies  Allergen Reactions  . Ace Inhibitors     cough  . Bee Venom     Bee  . Dapagliflozin     hematuria     ROS  Constitutional: Negative for fever and mild  weight change.  Respiratory: Positive  for cough and occasional shortness of breath.   Cardiovascular: Negative for  chest pain or palpitations.  Gastrointestinal: Negative for abdominal pain, no bowel changes.  Musculoskeletal: Negative for gait problem or joint swelling.  Skin: Negative for rash.  Neurological: Negative for dizziness or headache.  No other specific complaints in a complete review of systems (except as listed in HPI above).  Objective  Filed Vitals:   06/02/15 0807  BP: 116/72  Pulse: 104  Temp: 98.5 F (36.9 C)  TempSrc: Oral  Resp: 18  Height:  (1.702 m)  Weight: 209 lb 12.8 oz (95.165 kg)  SpO2: 96%    Body mass index is 32.85 kg/(m^2).  Physical Exam  Constitutional:  Patient appears well-developed and well-nourished. Obese No distress.  HEENT: head atraumatic, normocephalic, pupils equal and reactive to light, ears TM neck supple, throat within normal limits Cardiovascular: Normal rate, regular rhythm and normal heart sounds.  No murmur heard. No BLE edema. Pulmonary/Chest: Effort normal and breath sounds normal. No respiratory distress. Abdominal: Soft.  There is no tenderness. Psychiatric: Patient has a normal mood and affect. behavior is normal. Judgment and thought content normal.  Recent Results (from the past 2160 hour(s))  CBC with Differential/Platelet     Status: Abnormal   Collection Time: 03/23/15  4:52 PM  Result Value Ref Range   WBC 10.4 3.4 - 10.8 x10E3/uL   RBC 4.43 3.77 - 5.28 x10E6/uL   Hemoglobin 12.0 11.1 - 15.9 g/dL   Hematocrit 16.1 09.6 - 46.6 %   MCV 80 79 - 97 fL   MCH 27.1 26.6 - 33.0 pg   MCHC 34.1 31.5 - 35.7 g/dL   RDW 04.5 40.9 - 81.1 %   Platelets 456 (H) 150 - 379 x10E3/uL   Neutrophils 57 %   Lymphs 32 %   Monocytes 10 %   Eos 1 %   Basos 0 %   Neutrophils Absolute 5.8 1.4 - 7.0 x10E3/uL   Lymphocytes Absolute 3.3 (H) 0.7 - 3.1 x10E3/uL   Monocytes Absolute 1.1 (H) 0.1 - 0.9 x10E3/uL   EOS (ABSOLUTE) 0.2 0.0 - 0.4 x10E3/uL   Basophils Absolute 0.0 0.0 - 0.2 x10E3/uL   Immature Granulocytes 0 %   Immature Grans (Abs)  0.0 0.0 - 0.1 x10E3/uL    PHQ2/9: Depression screen [facility] 2/9 06/02/2015 03/03/2015  Decreased Interest 0 0  Down, Depressed, Hopeless 0 0  PHQ - 2 Score 0 0     Fall Risk: Fall Risk  06/02/2015 03/03/2015  Falls in the past year? No No    Functional Status Survey: Is the patient deaf or have difficulty hearing?: No Does the patient have difficulty seeing, even when wearing glasses/contacts?: Yes (glasses) Does the patient have difficulty concentrating, remembering, or making decisions?: No Does the patient have difficulty walking or climbing stairs?: No Does the patient have difficulty dressing or bathing?: No Does the patient have difficulty doing errands alone such as visiting a doctor's office or shopping?: No    Assessment & Plan  1. Type 2 diabetes mellitus with polyneuropathy (HCC)  Glucose has gone up, eating a snack at night, back on Pepsi and stopped checking fsbs about 2 months ago. Discussed options, resume diet and check glucose daily again, we will add Tresiba start at 10 units daily and send me a message on MyChart with sugar reading after 5 days and I will adjust dose - POCT HgB A1C  2. Insomnia, persistent  Doing well on Temazepam   3. Cough  Normal lung exam, no longer smokes, spirometry no significant response to albuterol, pending X-ray possible COPD - Spirometry: Pre & Post Eval - albuterol (PROVENTIL) (2.5 MG/3ML) 0.083% nebulizer solution 2.5 mg; Take 3 mLs (2.5 mg total) by nebulization once.  4. Benign hypertension  At goal - Comprehensive metabolic panel  5. Obstructive apnea  Feeling tired, we will add Nuvigil before work, compliant with CPAP - CBC with Differential/Platelet  6. Dyslipidemia  - Lipid panel  7. Needs flu shot  - Flu Vaccine QUAD 36+ mos IM - had it at work, will send paperwork so we can enter in our chart  8. Long-term use of high-risk medication  - Vitamin B12  9.  Other fatigue  - Comprehensive metabolic panel -  CBC with Differential/Platelet - Vitamin B12 - Vit D  25 hydroxy (rtn osteoporosis monitoring) - TSH  10. Abnormal chest x-ray  Now with cough recurrence, recheck X-ray  - DG Chest 2 View; Future  13. CAP (community acquired pneumonia)  - levofloxacin (LEVAQUIN) 750 MG tablet; Take 1 tablet (750 mg total) by mouth daily.  Dispense: 5 tablet; Refill: 0 Got report from CXR has early recurrence of pneumonia , we will try Levaquin return in 3 weeks for repeat CXR

## 2015-06-03 LAB — COMPREHENSIVE METABOLIC PANEL
ALT: 36 IU/L — AB (ref 0–32)
AST: 26 IU/L (ref 0–40)
Albumin/Globulin Ratio: 1.8 (ref 1.1–2.5)
Albumin: 4.6 g/dL (ref 3.5–5.5)
Alkaline Phosphatase: 132 IU/L — ABNORMAL HIGH (ref 39–117)
BUN/Creatinine Ratio: 14 (ref 9–23)
BUN: 10 mg/dL (ref 6–24)
Bilirubin Total: 0.3 mg/dL (ref 0.0–1.2)
CALCIUM: 10.6 mg/dL — AB (ref 8.7–10.2)
CO2: 25 mmol/L (ref 18–29)
CREATININE: 0.74 mg/dL (ref 0.57–1.00)
Chloride: 92 mmol/L — ABNORMAL LOW (ref 97–106)
GFR calc Af Amer: 107 mL/min/{1.73_m2} (ref >59)
GFR, EST NON AFRICAN AMERICAN: 93 mL/min/{1.73_m2} (ref >59)
GLOBULIN, TOTAL: 2.5 g/dL (ref 1.5–4.5)
Glucose: 226 mg/dL — ABNORMAL HIGH (ref 65–99)
Potassium: 4.3 mmol/L (ref 3.5–5.2)
Sodium: 136 mmol/L (ref 136–144)
TOTAL PROTEIN: 7.1 g/dL (ref 6.0–8.5)

## 2015-06-03 LAB — CBC WITH DIFFERENTIAL/PLATELET
BASOS: 0 %
Basophils Absolute: 0 10*3/uL (ref 0.0–0.2)
EOS (ABSOLUTE): 0.3 10*3/uL (ref 0.0–0.4)
EOS: 3 %
HEMATOCRIT: 38.1 % (ref 34.0–46.6)
HEMOGLOBIN: 12.7 g/dL (ref 11.1–15.9)
IMMATURE GRANS (ABS): 0 10*3/uL (ref 0.0–0.1)
IMMATURE GRANULOCYTES: 0 %
LYMPHS: 42 %
Lymphocytes Absolute: 3.6 10*3/uL — ABNORMAL HIGH (ref 0.7–3.1)
MCH: 27.4 pg (ref 26.6–33.0)
MCHC: 33.3 g/dL (ref 31.5–35.7)
MCV: 82 fL (ref 79–97)
Monocytes Absolute: 0.5 10*3/uL (ref 0.1–0.9)
Monocytes: 6 %
Neutrophils Absolute: 4.3 10*3/uL (ref 1.4–7.0)
Neutrophils: 49 %
Platelets: 421 10*3/uL — ABNORMAL HIGH (ref 150–379)
RBC: 4.64 x10E6/uL (ref 3.77–5.28)
RDW: 14.1 % (ref 12.3–15.4)
WBC: 8.7 10*3/uL (ref 3.4–10.8)

## 2015-06-03 LAB — LIPID PANEL
CHOL/HDL RATIO: 3.8 ratio (ref 0.0–4.4)
CHOLESTEROL TOTAL: 140 mg/dL (ref 100–199)
HDL: 37 mg/dL — ABNORMAL LOW (ref >39)
LDL CALC: 81 mg/dL (ref 0–99)
TRIGLYCERIDES: 111 mg/dL (ref 0–149)
VLDL Cholesterol Cal: 22 mg/dL (ref 5–40)

## 2015-06-03 LAB — TSH: TSH: 2.66 u[IU]/mL (ref 0.450–4.500)

## 2015-06-03 LAB — VITAMIN B12: Vitamin B-12: 523 pg/mL (ref 211–946)

## 2015-06-03 LAB — VITAMIN D 25 HYDROXY (VIT D DEFICIENCY, FRACTURES): Vit D, 25-Hydroxy: 18.4 ng/mL — ABNORMAL LOW (ref 30.0–100.0)

## 2015-06-04 ENCOUNTER — Other Ambulatory Visit: Payer: Self-pay | Admitting: Family Medicine

## 2015-06-04 MED ORDER — VITAMIN D (ERGOCALCIFEROL) 1.25 MG (50000 UNIT) PO CAPS: 50000.0000 [IU] | ORAL_CAPSULE | ORAL | Status: DC

## 2015-07-04 ENCOUNTER — Ambulatory Visit
Admission: RE | Admit: 2015-07-04 | Discharge: 2015-07-04 | Disposition: A | Discharge status: Home / Self Care | Payer: 59 | Source: Ambulatory Visit | Attending: Family Medicine | Admitting: Family Medicine

## 2015-07-04 ENCOUNTER — Encounter: Payer: Self-pay | Admitting: Family Medicine

## 2015-07-04 ENCOUNTER — Ambulatory Visit (INDEPENDENT_AMBULATORY_CARE_PROVIDER_SITE_OTHER): Payer: 59 | Admitting: Family Medicine

## 2015-07-04 VITALS — BP 120/78 | HR 99 | Temp 98.2°F | Resp 16 | Ht 67.0 in | Wt 213.1 lb

## 2015-07-04 DIAGNOSIS — R05 Cough: Secondary | ICD-10-CM | POA: Diagnosis not present

## 2015-07-04 DIAGNOSIS — Z8701 Personal history of pneumonia (recurrent): Secondary | ICD-10-CM | POA: Diagnosis present

## 2015-07-04 DIAGNOSIS — R9389 Abnormal findings on diagnostic imaging of other specified body structures: Secondary | ICD-10-CM

## 2015-07-04 DIAGNOSIS — K219 Gastro-esophageal reflux disease without esophagitis: Secondary | ICD-10-CM | POA: Diagnosis not present

## 2015-07-04 DIAGNOSIS — R059 Cough, unspecified: Secondary | ICD-10-CM

## 2015-07-04 DIAGNOSIS — R938 Abnormal findings on diagnostic imaging of other specified body structures: Secondary | ICD-10-CM | POA: Insufficient documentation

## 2015-07-04 DIAGNOSIS — R110 Nausea: Secondary | ICD-10-CM | POA: Diagnosis not present

## 2015-07-04 DIAGNOSIS — E1142 Type 2 diabetes mellitus with diabetic polyneuropathy: Secondary | ICD-10-CM

## 2015-07-04 MED ORDER — GLUCOSE BLOOD VI STRP: ORAL_STRIP | Status: DC

## 2015-07-04 MED ORDER — INSULIN PEN NEEDLE 32G X 6 MM MISC
2.0000 | Freq: Every day | Status: DC
Start: 2015-07-04 — End: 2016-03-04

## 2015-07-04 MED ORDER — OMEPRAZOLE 40 MG PO CPDR: 40.0000 mg | DELAYED_RELEASE_CAPSULE | Freq: Every day | ORAL | Status: DC

## 2015-07-04 NOTE — Progress Notes (Signed)
Name: Joyce Blackburn   MRN: [id number]    DOB: 1960-09-15   Date:07/04/2015       Progress Note  Subjective  Chief Complaint  Chief Complaint  Patient presents with  . Medication Management    1 month F/U  . Diabetes    Checking blood glucose 1x day fasting low-120, high144 still on 10 units of tresiba  . Cough    finnished antibiotic and still having cough    HPI   Cough: she was treated for CAP in 02/2015,  she felt better, and stopped coughing but had another episode of CAP diagnosed 06/02/2015 with CXR. She is feeling better, but still has a cough, mostly dry now, no SOB. She feels like she is feeling better overall. We need to repeat CXR. She denies wheezing or fever. Previous smoker, quit years ago  DMII: She is checking fsbs at home and is on average 120-151's. She has been trying to be compliant with her diet, still drinking 16 ounces of  Pepsi daily  . She denies polyphagia, polydipsia or polyuria. Started on Tresiba one month and it seems to be keeping her glucose at lower levels without hypoglycemic episodes.    GERD: she also has a history of gastric ulcer about 20 years ago. She has noticed recurrence of nausea in the evening and has improved with PPI use, she needs a refill. No heartburn, no blood in stools, no vomiting, no epigastric pain or change in bowel movements. She states she has some nausea when she smells fried food  Patient Active Problem List   Diagnosis Date Noted  . Diabetic polyneuropathy associated with type 2 diabetes mellitus (HCC) 03/03/2015  . Peripheral neuropathy (HCC) 03/03/2015  . Allergic to bees 02/20/2015  . Benign hypertension 02/20/2015  . Insomnia, persistent 02/20/2015  . Chronic idiopathic constipation 02/20/2015  . Dyslipidemia 02/20/2015  . Dermatitis, eczematoid 02/20/2015  . Gastro-esophageal reflux disease without esophagitis 02/20/2015  . H/O gastric ulcer 02/20/2015  . Herpes 02/20/2015  . Migraine without aura and responsive  to treatment 02/20/2015  . Adult BMI 30+ 02/20/2015  . Obstructive apnea 02/20/2015  . Paresthesia of arm 02/20/2015  . Periodic limb movement 02/20/2015  . Allergic rhinitis, seasonal 02/20/2015  . Menopausal symptom 02/20/2015    Past Surgical History  Procedure Laterality Date  . Abdominal hysterectomy      Family History  Problem Relation Age of Onset  . Mental illness Mother   . Diabetes Mother   . CVA Mother   . Schizophrenia Mother   . Diabetes Sister   . Hypertension Sister   . Diabetes Brother   . Mental illness Brother   . Bipolar disorder Brother   . Schizophrenia Brother     Social History   Social History  . Marital Status: Married    Spouse Name: N/A  . Number of Children: N/A  . Years of Education: N/A   Occupational History  . Not on file.   Social History Main Topics  . Smoking status: Former Smoker -- 0.10 packs/day for 15 years    Quit date: 07/29/2004  . Smokeless tobacco: Not on file  . Alcohol Use: No  . Drug Use: No  . Sexual Activity: Yes   Other Topics Concern  . Not on file   Social History Narrative     Current outpatient prescriptions:  .  albuterol (PROVENTIL HFA;VENTOLIN HFA) 108 (90 BASE) MCG/ACT inhaler, Inhale 2 puffs into the lungs every 6 (six) hours as  needed for wheezing or shortness of breath., Disp: 1 Inhaler, Rfl: 0 .  amLODipine (NORVASC) 2.5 MG tablet, Take 1 tablet (2.5 mg total) by mouth daily., Disp: 90 tablet, Rfl: 1 .  Armodafinil 150 MG tablet, Take 1 tablet (150 mg total) by mouth daily., Disp: 30 tablet, Rfl: 2 .  atorvastatin (LIPITOR) 40 MG tablet, Take 1 tablet (40 mg total) by mouth daily., Disp: 90 tablet, Rfl: 1 .  chlorpheniramine-HYDROcodone (TUSSIONEX PENNKINETIC ER) 10-8 MG/5ML SUER, Take 5 mLs by mouth every 12 (twelve) hours as needed for cough., Disp: 140 mL, Rfl: 0 .  EPINEPHRINE, ANAPHYLAXIS THERAPY AGENTS,, EPIPEN 2-PAK, 0.3MG /0.3ML (Injection Device)  1 Device use as directed for 30 days   Quantity: 2;  Refills: 1   Ordered :06-Mar-2012  Alba Cory MD;  Started 06-Mar-2012 Active Comments: DX: 989.5, Disp: , Rfl:  .  frovatriptan (FROVA) 2.5 MG tablet, Take 1 tablet by mouth as needed., Disp: , Rfl:  .  Insulin Degludec (TRESIBA FLEXTOUCH) 200 UNIT/ML SOPN, Inject 10 Units into the skin daily., Disp: 15 mL, Rfl: 0 .  irbesartan-hydrochlorothiazide (AVALIDE) 300-12.5 MG per tablet, Take 1 tablet by mouth daily., Disp: 90 tablet, Rfl: 1 .  Liraglutide (VICTOZA) 18 MG/3ML SOPN, Inject 0.2 mLs (1.2 mg total) into the skin daily., Disp: 18 mL, Rfl: 1 .  metformin (FORTAMET) 1000 MG (OSM) 24 hr tablet, Take 1 tablet (1,000 mg total) by mouth 2 (two) times daily with a meal., Disp: 180 tablet, Rfl: 1 .  polyethylene glycol powder (GLYCOLAX/MIRALAX) powder, Take 17 g by mouth 2 (two) times daily as needed. (Patient not taking: Reported on 06/02/2015), Disp: 3350 g, Rfl: 1 .  sertraline (ZOLOFT) 50 MG tablet, Take 1 tablet (50 mg total) by mouth daily., Disp: 90 tablet, Rfl: 1 .  temazepam (RESTORIL) 30 MG capsule, Take 1 capsule (30 mg total) by mouth daily., Disp: 90 capsule, Rfl: 1 .  triamcinolone lotion (KENALOG) 0.1 %, TRIAMCINOLONE ACETONIDE, 0.1% (External Lotion)  1 (one) Lotion Lotion apply to rash twice daily for 30 days  Quantity: 30;  Refills: 0   Ordered :24-January-2014  Alba Cory MD;  Mora Appl 24-January-2014 Active, Disp: , Rfl:  .  Vitamin D, Ergocalciferol, (DRISDOL) 50000 UNITS CAPS capsule, Take 1 capsule (50,000 Units total) by mouth every 7 (seven) days., Disp: 12 capsule, Rfl: 0  Allergies  Allergen Reactions  . Ace Inhibitors     cough  . Bee Venom     Bee  . Dapagliflozin     hematuria     ROS  Ten systems reviewed and is negative except as mentioned in HPI  Objective  Filed Vitals:   07/04/15 1525  BP: 120/78  Pulse: 99  Temp: 98.2 F (36.8 C)  TempSrc: Oral  Resp: 16  Height:  (1.702 m)  Weight: 213 lb 1.6 oz (96.662 kg)  SpO2: 96%     Body mass index is 33.37 kg/(m^2).  Physical Exam  Constitutional: Patient appears well-developed and well-nourished. Obese No distress.  HEENT: head atraumatic, normocephalic, pupils equal and reactive to light, neck supple, throat within normal limits Cardiovascular: Normal rate, regular rhythm and normal heart sounds.  No murmur heard. No BLE edema. Pulmonary/Chest: Effort normal and breath sounds normal. No respiratory distress. Abdominal: Soft.  There is no tenderness. Psychiatric: Patient has a normal mood and affect. behavior is normal. Judgment and thought content normal.  Recent Results (from the past 2160 hour(s))  POCT HgB A1C     Status:  Abnormal   Collection Time: 06/02/15  8:48 AM  Result Value Ref Range   Hemoglobin A1C 9.8   Lipid panel     Status: Abnormal   Collection Time: 06/02/15  9:07 AM  Result Value Ref Range   Cholesterol, Total 140 100 - 199 mg/dL   Triglycerides 161 0 - 149 mg/dL   HDL 37 (L) >09 mg/dL    Comment: According to ATP-III Guidelines, HDL-C >59 mg/dL is considered a negative risk factor for CHD.    VLDL Cholesterol Cal 22 5 - 40 mg/dL   LDL Calculated 81 0 - 99 mg/dL   Chol/HDL Ratio 3.8 0.0 - 4.4 ratio units    Comment:                                   T. Chol/HDL Ratio                                             Men  Women                               1/2 Avg.Risk  3.4    3.3                                   Avg.Risk  5.0    4.4                                2X Avg.Risk  9.6    7.1                                3X Avg.Risk 23.4   11.0   Comprehensive metabolic panel     Status: Abnormal   Collection Time: 06/02/15  9:07 AM  Result Value Ref Range   Glucose 226 (H) 65 - 99 mg/dL   BUN 10 6 - 24 mg/dL   Creatinine, Ser 6.04 0.57 - 1.00 mg/dL   GFR calc non Af Amer 93 >59 mL/min/1.73   GFR calc Af Amer 107 >59 mL/min/1.73   BUN/Creatinine Ratio 14 9 - 23   Sodium 136 136 - 144 mmol/L   Potassium 4.3 3.5 - 5.2 mmol/L    Chloride 92 (L) 97 - 106 mmol/L   CO2 25 18 - 29 mmol/L   Calcium 10.6 (H) 8.7 - 10.2 mg/dL   Total Protein 7.1 6.0 - 8.5 g/dL   Albumin 4.6 3.5 - 5.5 g/dL   Globulin, Total 2.5 1.5 - 4.5 g/dL   Albumin/Globulin Ratio 1.8 1.1 - 2.5   Bilirubin Total 0.3 0.0 - 1.2 mg/dL   Alkaline Phosphatase 132 (H) 39 - 117 IU/L   AST 26 0 - 40 IU/L   ALT 36 (H) 0 - 32 IU/L  CBC with Differential/Platelet     Status: Abnormal   Collection Time: 06/02/15  9:07 AM  Result Value Ref Range   WBC 8.7 3.4 - 10.8 x10E3/uL   RBC 4.64 3.77 - 5.28 x10E6/uL   Hemoglobin 12.7 11.1 - 15.9 g/dL   Hematocrit 54.0 98.1 -  46.6 %   MCV 82 79 - 97 fL   MCH 27.4 26.6 - 33.0 pg   MCHC 33.3 31.5 - 35.7 g/dL   RDW 69.614.1 29.512.3 - 28.415.4 %   Platelets 421 (H) 150 - 379 x10E3/uL   Neutrophils 49 %   Lymphs 42 %   Monocytes 6 %   Eos 3 %   Basos 0 %   Neutrophils Absolute 4.3 1.4 - 7.0 x10E3/uL   Lymphocytes Absolute 3.6 (H) 0.7 - 3.1 x10E3/uL   Monocytes Absolute 0.5 0.1 - 0.9 x10E3/uL   EOS (ABSOLUTE) 0.3 0.0 - 0.4 x10E3/uL   Basophils Absolute 0.0 0.0 - 0.2 x10E3/uL   Immature Granulocytes 0 %   Immature Grans (Abs) 0.0 0.0 - 0.1 x10E3/uL  Vitamin B12     Status: None   Collection Time: 06/02/15  9:07 AM  Result Value Ref Range   Vitamin B-12 523 211 - 946 pg/mL  Vit D  25 hydroxy (rtn osteoporosis monitoring)     Status: Abnormal   Collection Time: 06/02/15  9:07 AM  Result Value Ref Range   Vit D, 25-Hydroxy 18.4 (L) 30.0 - 100.0 ng/mL    Comment: Vitamin D deficiency has been defined by the Institute of Medicine and an Endocrine Society practice guideline as a level of serum 25-OH vitamin D less than 20 ng/mL (1,2). The Endocrine Society went on to further define vitamin D insufficiency as a level between 21 and 29 ng/mL (2). 1. IOM (Institute of Medicine). 2010. Dietary reference    intakes for calcium and D. [place]: The    [employer]. 2. Holick MF, Binkley Wisner, Bischoff-Ferrari HA, et  al.    Evaluation, treatment, and prevention of vitamin D    deficiency: an Endocrine Society clinical practice    guideline. JCEM. 2011 Jul; 96(7):1911-30.   TSH     Status: None   Collection Time: 06/02/15  9:07 AM  Result Value Ref Range   TSH 2.660 0.450 - 4.500 uIU/mL      PHQ2/9: Depression screen [facility] 2/9 06/02/2015 03/03/2015  Decreased Interest 0 0  Down, Depressed, Hopeless 0 0  PHQ - 2 Score 0 0     Fall Risk: Fall Risk  06/02/2015 03/03/2015  Falls in the past year? No No      Assessment & Plan   1. Type 2 diabetes mellitus with polyneuropathy (HCC)  She is doing well, needs to stop drinking sodas and having desserts, but glucose is better controlled with medication  - Insulin Pen Needle (NOVOFINE) 32G X 6 MM MISC; 2 each by Does not apply route daily.  Dispense: 200 each; Refill: 2 - glucose blood (ONE TOUCH ULTRA TEST) test strip; Use as instructed  Dispense: 100 each; Refill: 2  2. Cough  Gradually improving since last episode of pneumonia this past November. No longer has SOB , wheezing or fatigue  3. Abnormal CXR  Recheck CXR - DG Chest 2 View; Future  4. Gastroesophageal reflux disease without esophagitis  She has noticed worsening of symptoms lately and has been taking medication every night before bed, advised to take it before dinner, explained that it may be biliary cholic.  - omeprazole (PRILOSEC) 40 MG capsule; Take 1 capsule (40 mg total) by mouth daily.  Dispense: 90 capsule; Refill: 1  5. Nausea  After meals or with the smell of fried food, check gallbladder US - US Abdomen Limited RUQ; Future

## 2015-07-07 ENCOUNTER — Ambulatory Visit
Admission: RE | Admit: 2015-07-07 | Discharge: 2015-07-07 | Disposition: A | Discharge status: Home / Self Care | Payer: 59 | Source: Ambulatory Visit | Attending: Family Medicine | Admitting: Family Medicine

## 2015-07-07 DIAGNOSIS — R110 Nausea: Secondary | ICD-10-CM | POA: Insufficient documentation

## 2015-07-08 ENCOUNTER — Other Ambulatory Visit: Payer: Self-pay | Admitting: Family Medicine

## 2015-07-08 DIAGNOSIS — R932 Abnormal findings on diagnostic imaging of liver and biliary tract: Secondary | ICD-10-CM

## 2015-07-10 ENCOUNTER — Encounter: Payer: Self-pay | Admitting: Gastroenterology

## 2015-07-13 ENCOUNTER — Encounter: Payer: Self-pay | Admitting: Family Medicine

## 2015-08-22 ENCOUNTER — Other Ambulatory Visit: Payer: Self-pay | Admitting: Family Medicine

## 2015-08-22 NOTE — Telephone Encounter (Signed)
Patient requesting refill. 

## 2015-08-28 ENCOUNTER — Other Ambulatory Visit: Payer: Self-pay | Admitting: Family Medicine

## 2015-08-28 NOTE — Telephone Encounter (Signed)
Patient requesting refill. 

## 2015-08-29 ENCOUNTER — Encounter: Payer: Self-pay | Admitting: Gastroenterology

## 2015-08-29 ENCOUNTER — Ambulatory Visit (INDEPENDENT_AMBULATORY_CARE_PROVIDER_SITE_OTHER): Payer: 59 | Admitting: Gastroenterology

## 2015-08-29 VITALS — BP 117/76 | HR 87 | Temp 98.8°F | Ht 67.0 in | Wt 207.2 lb

## 2015-08-29 DIAGNOSIS — R1084 Generalized abdominal pain: Secondary | ICD-10-CM | POA: Diagnosis not present

## 2015-08-29 DIAGNOSIS — R748 Abnormal levels of other serum enzymes: Secondary | ICD-10-CM | POA: Diagnosis not present

## 2015-08-29 NOTE — Progress Notes (Signed)
Gastroenterology Consultation  Referring Provider:     Alba Cory, MD Primary Care Physician:  Ruel Favors, MD Primary Gastroenterologist:  Dr. Servando Snare     Reason for Consultation:     Abnormal liver ultrasound        HPI:   Joyce Blackburn is a 55 y.o. y/o female referred for consultation & management of abnormal liver ultrasound by Dr. Ruel Favors, MD.  This patient comes today with a history of abnormal liver enzymes and a ultrasound of the liver that showed her to have a dense liver consistent with hepatocellular disease versus fatty liver. The patient reports that this was all done because she states she has nausea when she smells food. She also reports that she has epigastric discomfort at times. The patient also reports that when she eats she has a lot of burping and if she does not burp she will then pass a lot of flatulence. There is no report of any unexplained weight loss, fevers, chills, nausea or vomiting. The patient also denies any family history of any liver problems. The patient does report she had a colonoscopy 3 years ago that was normal.although I do not have those results I see a interaction she had with Dr. Niel Hummer back in 2011. The patient was concerned that some of the medication she takes can be causing her problems with her liver.  Past Medical History  Diagnosis Date  . Bee sting allergy   . Encounter for therapeutic drug monitoring   . 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 Migraine without aura and without status migrainosus, not intractable   . Chronic idiopathic constipation   . Allergy   . Hyperlipidemia   . Ankle edema   . Diabetes mellitus without complication (HCC)   . Hypertension   . Obesity   . Chronic insomnia   . Symptomatic menopausal or female climacteric states   . Eczema   . Paresthesia of left arm   . GERD (gastroesophageal reflux disease)   . Periodic limb movement disorder   . History of gastric ulcer   . Pain in joint,  lower leg   . OSA (obstructive sleep apnea)     Past Surgical History  Procedure Laterality Date  . Abdominal hysterectomy      Prior to Admission medications   Medication Sig Start Date End Date Taking? Authorizing Provider  albuterol (PROVENTIL HFA;VENTOLIN HFA) 108 (90 BASE) MCG/ACT inhaler Inhale 2 puffs into the lungs every 6 (six) hours as needed for wheezing or shortness of breath. 03/24/15  Yes Alba Cory, MD  amLODipine (NORVASC) 2.5 MG tablet Take 1 tablet (2.5 mg total) by mouth daily. 03/07/15  Yes Alba Cory, MD  Armodafinil 150 MG tablet Take 1 tablet (150 mg total) by mouth daily. 06/02/15  Yes Alba Cory, MD  aspirin 81 MG tablet Take 81 mg by mouth daily.   Yes Historical Provider, MD  atorvastatin (LIPITOR) 40 MG tablet Take 1 tablet (40 mg total) by mouth daily. 03/03/15  Yes Alba Cory, MD  EPINEPHRINE, ANAPHYLAXIS THERAPY AGENTS, EPIPEN 2-PAK, 0.3MG /0.3ML (Injection Device)  1 Device use as directed for 30 days  Quantity: 2;  Refills: 1   Ordered :06-Mar-2012  Alba Cory MD;  Mora Appl 06-Mar-2012 Active Comments: DX: 989.5 03/06/12  Yes Historical Provider, MD  frovatriptan (FROVA) 2.5 MG tablet Take 1 tablet by mouth as needed. 08/05/10  Yes Historical Provider, MD  Insulin Degludec (TRESIBA FLEXTOUCH) 200 UNIT/ML SOPN Inject 10 Units  into the skin daily. 06/02/15  Yes Alba Cory, MD  Insulin Pen Needle (NOVOFINE) 32G X 6 MM MISC 2 each by Does not apply route daily. 07/04/15  Yes Alba Cory, MD  irbesartan-hydrochlorothiazide (AVALIDE) 300-12.5 MG per tablet Take 1 tablet by mouth daily. 03/03/15  Yes Alba Cory, MD  Liraglutide (VICTOZA) 18 MG/3ML SOPN Inject 0.2 mLs (1.2 mg total) into the skin daily. 03/03/15  Yes Alba Cory, MD  metformin (FORTAMET) 1000 MG (OSM) 24 hr tablet Take 1 tablet (1,000 mg total) by mouth 2 (two) times daily with a meal. 03/07/15  Yes Alba Cory, MD  omeprazole (PRILOSEC) 40 MG capsule Take 1 capsule (40 mg total)  by mouth daily. 07/04/15  Yes Alba Cory, MD  ONE TOUCH ULTRA TEST test strip Use as directed two times  daily 08/22/15  Yes Alba Cory, MD  sertraline (ZOLOFT) 50 MG tablet Take 1 tablet (50 mg total) by mouth daily. 03/03/15  Yes Alba Cory, MD  temazepam (RESTORIL) 30 MG capsule Take 1 capsule (30 mg total) by mouth daily. 03/03/15  Yes Alba Cory, MD  triamcinolone lotion (KENALOG) 0.1 % TRIAMCINOLONE ACETONIDE, 0.1% (External Lotion)  1 (one) Lotion Lotion apply to rash twice daily for 30 days  Quantity: 30;  Refills: 0   Ordered :24-January-2014  Alba Cory MD;  Started 24-January-2014 Active 01/24/14  Yes Historical Provider, MD  Vitamin D, Ergocalciferol, (DRISDOL) 50000 UNITS CAPS capsule Take 1 capsule (50,000 Units total) by mouth every 7 (seven) days. 06/04/15  Yes Alba Cory, MD  polyethylene glycol powder (GLYCOLAX/MIRALAX) powder Take 17 g by mouth 2 (two) times daily as needed. Patient not taking: Reported on 06/02/2015 03/03/15   Alba Cory, MD    Family History  Problem Relation Age of Onset  . Mental illness Mother   . Diabetes Mother   . CVA Mother   . Schizophrenia Mother   . Diabetes Sister   . Hypertension Sister   . Diabetes Brother   . Mental illness Brother   . Bipolar disorder Brother   . Schizophrenia Brother      Social History  Substance Use Topics  . Smoking status: Former Smoker -- 0.10 packs/day for 15 years    Quit date: 07/29/2004  . Smokeless tobacco: Never Used  . Alcohol Use: No    Allergies as of 08/29/2015 - Review Complete 08/29/2015  Allergen Reaction Noted  . Ace inhibitors  02/14/2015  . Bee venom  02/14/2015  . Dapagliflozin  02/14/2015    Review of Systems:    All systems reviewed and negative except where noted in HPI.   Physical Exam:  BP 117/76 mmHg  Pulse 87  Temp(Src) 98.8 F (37.1 C) (Oral)  Ht  (1.702 m)  Wt 207 lb 3.2 oz (93.985 kg)  BMI 32.44 kg/m2 No LMP recorded. Patient has had a  hysterectomy. Psych:  Alert and cooperative. Normal mood and affect. General:   Alert,  Well-developed, well-nourished, pleasant and cooperative in NAD Head:  Normocephalic and atraumatic. Eyes:  Sclera clear, no icterus.   Conjunctiva pink. Ears:  Normal auditory acuity. Nose:  No deformity, discharge, or lesions. Mouth:  No deformity or lesions,oropharynx pink & moist. Neck:  Supple; no masses or thyromegaly. Lungs:  Respirations even and unlabored.  Clear throughout to auscultation.   No wheezes, crackles, or rhonchi. No acute distress. Heart:  Regular rate and rhythm; no murmurs, clicks, rubs, or gallops. Abdomen:  Normal bowel sounds.  No bruits.  Soft, mild  epigastric tenderness and non-distended without masses, hepatosplenomegaly or hernias noted.  No guarding or rebound tenderness.  Negative Carnett sign.   Rectal:  Deferred.  Msk:  Symmetrical without gross deformities.  Good, equal movement & strength bilaterally. Pulses:  Normal pulses noted. Extremities:  No clubbing or edema.  No cyanosis. Neurologic:  Alert and oriented x3;  grossly normal neurologically. Skin:  Intact without significant lesions or rashes.  No jaundice. Lymph Nodes:  No significant cervical adenopathy. Psych:  Alert and cooperative. Normal mood and affect.  Imaging Studies: No results found.  Assessment and Plan:   EUDORA GUEVARRA is a 55 y.o. y/o female comes in with an abnormal ultrasound showing fatty liver. The patient is on Lipitor but this is unlikely causing her fatty liver. The patient also was noted to have slightly elevated liver enzymes. The patient will have her labs checked for possible causes of abnormal liver enzymes but she has been told that fatty liver is likely caused by increased weight although it can also be caused by diabetes and hypercholesterolemia. The patient does have nausea with fatty foods and she had an ultrasound that did not show anything wrong with her gallbladder but she  will be set up for a HIDA scan with [facility]. She will also be given samples of Dexilant for her nausea as her omeprazole may not be keeping her reflux under control thereby giving her nausea. The patient will also try to lose weight to decrease the fat in her body thereby decreasing the fat in her liver. The patient has been explained the plan and agrees with it.   Note: This dictation was prepared with Dragon dictation along with smaller phrase technology. Any transcriptional errors that result from this process are unintentional.

## 2015-08-30 LAB — ANTI-SMOOTH MUSCLE ANTIBODY, IGG: SMOOTH MUSCLE AB: 33 U — AB (ref 0–19)

## 2015-08-30 LAB — PROTIME-INR
INR: 1 (ref 0.8–1.2)
Prothrombin Time: 10.1 s (ref 9.1–12.0)

## 2015-08-30 LAB — HEPATITIS A ANTIBODY, TOTAL: HEP A TOTAL AB: NEGATIVE

## 2015-08-30 LAB — IRON AND TIBC
IRON SATURATION: 15 % (ref 15–55)
IRON: 54 ug/dL (ref 27–159)
Total Iron Binding Capacity: 356 ug/dL (ref 250–450)
UIBC: 302 ug/dL (ref 131–425)

## 2015-08-30 LAB — ANA: Anti Nuclear Antibody(ANA): NEGATIVE

## 2015-08-30 LAB — CERULOPLASMIN: CERULOPLASMIN: 36 mg/dL (ref 19.0–39.0)

## 2015-08-30 LAB — FERRITIN: FERRITIN: 46 ng/mL (ref 15–150)

## 2015-08-30 LAB — IGG, IGA, IGM
IGG (IMMUNOGLOBIN G), SERUM: 914 mg/dL (ref 700–1600)
IGM (IMMUNOGLOBULIN M), SRM: 23 mg/dL — AB (ref 26–217)
IgA/Immunoglobulin A, Serum: 152 mg/dL (ref 87–352)

## 2015-08-30 LAB — MITOCHONDRIAL ANTIBODIES: MITOCHONDRIAL AB: 6.6 U (ref 0.0–20.0)

## 2015-08-30 LAB — ALPHA-1-ANTITRYPSIN: A1 ANTITRYPSIN: 161 mg/dL (ref 90–200)

## 2015-08-30 LAB — HEPATITIS B SURFACE ANTIBODY,QUALITATIVE: Hep B Surface Ab, Qual: REACTIVE

## 2015-08-30 LAB — HEPATITIS B SURFACE ANTIGEN: HEP B S AG: NEGATIVE

## 2015-09-05 ENCOUNTER — Encounter: Payer: Self-pay | Admitting: Family Medicine

## 2015-09-05 ENCOUNTER — Ambulatory Visit (INDEPENDENT_AMBULATORY_CARE_PROVIDER_SITE_OTHER): Payer: 59 | Admitting: Family Medicine

## 2015-09-05 VITALS — BP 120/76 | HR 91 | Temp 98.2°F | Resp 16 | Ht 67.0 in | Wt 208.7 lb

## 2015-09-05 DIAGNOSIS — N951 Menopausal and female climacteric states: Secondary | ICD-10-CM

## 2015-09-05 DIAGNOSIS — Z23 Encounter for immunization: Secondary | ICD-10-CM | POA: Diagnosis not present

## 2015-09-05 DIAGNOSIS — K219 Gastro-esophageal reflux disease without esophagitis: Secondary | ICD-10-CM

## 2015-09-05 DIAGNOSIS — E785 Hyperlipidemia, unspecified: Secondary | ICD-10-CM | POA: Diagnosis not present

## 2015-09-05 DIAGNOSIS — I10 Essential (primary) hypertension: Secondary | ICD-10-CM

## 2015-09-05 DIAGNOSIS — E1142 Type 2 diabetes mellitus with diabetic polyneuropathy: Secondary | ICD-10-CM

## 2015-09-05 DIAGNOSIS — G4700 Insomnia, unspecified: Secondary | ICD-10-CM | POA: Diagnosis not present

## 2015-09-05 LAB — POCT GLYCOSYLATED HEMOGLOBIN (HGB A1C): Hemoglobin A1C: 8.5

## 2015-09-05 MED ORDER — METFORMIN HCL ER (OSM) 1000 MG PO TB24: 1000.0000 mg | ORAL_TABLET | Freq: Two times a day (BID) | ORAL | Status: DC

## 2015-09-05 MED ORDER — DEXLANSOPRAZOLE 60 MG PO CPDR: 60.0000 mg | DELAYED_RELEASE_CAPSULE | Freq: Every day | ORAL | Status: DC

## 2015-09-05 MED ORDER — LIRAGLUTIDE 18 MG/3ML ~~LOC~~ SOPN: 1.2000 mg | PEN_INJECTOR | Freq: Every day | SUBCUTANEOUS | Status: DC

## 2015-09-05 MED ORDER — ATORVASTATIN CALCIUM 40 MG PO TABS: 40.0000 mg | ORAL_TABLET | Freq: Every day | ORAL | Status: DC

## 2015-09-05 MED ORDER — INSULIN DEGLUDEC 200 UNIT/ML ~~LOC~~ SOPN: 15.0000 [IU] | PEN_INJECTOR | Freq: Every day | SUBCUTANEOUS | Status: DC

## 2015-09-05 MED ORDER — TEMAZEPAM 30 MG PO CAPS: 30.0000 mg | ORAL_CAPSULE | Freq: Every day | ORAL | Status: DC

## 2015-09-05 MED ORDER — SERTRALINE HCL 50 MG PO TABS: 50.0000 mg | ORAL_TABLET | Freq: Every day | ORAL | Status: DC

## 2015-09-05 MED ORDER — AMLODIPINE BESYLATE 2.5 MG PO TABS: 2.5000 mg | ORAL_TABLET | Freq: Every day | ORAL | Status: DC

## 2015-09-05 MED ORDER — IRBESARTAN-HYDROCHLOROTHIAZIDE 300-12.5 MG PO TABS: 1.0000 | ORAL_TABLET | Freq: Every day | ORAL | Status: DC

## 2015-09-05 NOTE — Progress Notes (Signed)
Name: Joyce Blackburn   MRN: [id number]    DOB: 1961-02-11   Date:09/05/2015       Progress Note  Subjective  Chief Complaint  Chief Complaint  Patient presents with  . Diabetes    patient is here for her 2- month f/u    HPI  Cough: she was treated for CAP in 02/2015, she felt better, and stopped coughing but had another episode of CAP diagnosed 06/02/2015 with CXR. Cough has finally resolved, feeling well.   DMII: She is checking fsbs at home and is on average fasting is 130's, lowest 75 when she did not eat at night, post-prandially in the 160's. She has been trying to be compliant with her diet, she is off sweet beverages, only drinking diet Ginger Ale . She denies polyphagia, polydipsia or polyuria. Started on Tresiba Fall 2016  and it seems to be keeping her glucose at lower levels without hypoglycemic episodes. Compliant with oral medication and also Victoza. Taking ARB. She states that neuropathy pain has improved, only has occasional symptoms.    Indigestion: she also has a history of gastric ulcer about 20 years ago. She has noticed recurrence of nausea in the evening and bloating. She was seen by Dr. Lars Pinks and had multiple labs done for evaluation of fatty liver and will have a HIDA scan. She is on Dexilant at this time with some improvement of symptoms, but needs a prescription. Failed Omeprazole 40 mg   Dyslipidemia: taking Lipitor and is tolerating it well.   HTN: well controlled, taking medication, denies chest pain or palpitation.   Patient Active Problem List   Diagnosis Date Noted  . Gastroesophageal reflux disease without esophagitis 07/04/2015  . Cough 07/04/2015  . Diabetic polyneuropathy associated with type 2 diabetes mellitus (HCC) 03/03/2015  . Peripheral neuropathy (HCC) 03/03/2015  . Allergic to bees 02/20/2015  . Benign hypertension 02/20/2015  . Insomnia, persistent 02/20/2015  . Chronic idiopathic constipation 02/20/2015  . Dyslipidemia 02/20/2015  .  Dermatitis, eczematoid 02/20/2015  . H/O gastric ulcer 02/20/2015  . Herpes 02/20/2015  . Migraine without aura and responsive to treatment 02/20/2015  . Adult BMI 30+ 02/20/2015  . Obstructive apnea 02/20/2015  . Paresthesia of arm 02/20/2015  . Periodic limb movement 02/20/2015  . Allergic rhinitis, seasonal 02/20/2015  . Menopausal symptom 02/20/2015    Past Surgical History  Procedure Laterality Date  . Abdominal hysterectomy      Family History  Problem Relation Age of Onset  . Mental illness Mother   . Diabetes Mother   . CVA Mother   . Schizophrenia Mother   . Diabetes Sister   . Hypertension Sister   . Diabetes Brother   . Mental illness Brother   . Bipolar disorder Brother   . Schizophrenia Brother     Social History   Social History  . Marital Status: Married    Spouse Name: N/A  . Number of Children: N/A  . Years of Education: N/A   Occupational History  . Not on file.   Social History Main Topics  . Smoking status: Former Smoker -- 0.10 packs/day for 15 years    Quit date: 07/29/2004  . Smokeless tobacco: Never Used  . Alcohol Use: No  . Drug Use: No  . Sexual Activity: Yes   Other Topics Concern  . Not on file   Social History Narrative     Current outpatient prescriptions:  .  albuterol (PROVENTIL HFA;VENTOLIN HFA) 108 (90 BASE) MCG/ACT inhaler, Inhale  2 puffs into the lungs every 6 (six) hours as needed for wheezing or shortness of breath., Disp: 1 Inhaler, Rfl: 0 .  amLODipine (NORVASC) 2.5 MG tablet, Take 1 tablet (2.5 mg total) by mouth daily., Disp: 90 tablet, Rfl: 1 .  Armodafinil 150 MG tablet, Take 1 tablet (150 mg total) by mouth daily., Disp: 30 tablet, Rfl: 2 .  aspirin 81 MG tablet, Take 81 mg by mouth daily., Disp: , Rfl:  .  atorvastatin (LIPITOR) 40 MG tablet, Take 1 tablet (40 mg total) by mouth daily., Disp: 90 tablet, Rfl: 1 .  dexlansoprazole (DEXILANT) 60 MG capsule, Take 1 capsule (60 mg total) by mouth daily., Disp: 90  capsule, Rfl: 1 .  EPINEPHRINE, ANAPHYLAXIS THERAPY AGENTS,, EPIPEN 2-PAK, 0.3MG /0.3ML (Injection Device)  1 Device use as directed for 30 days  Quantity: 2;  Refills: 1   Ordered :06-Mar-2012  Alba Cory MD;  Started 06-Mar-2012 Active Comments: DX: 989.5, Disp: , Rfl:  .  frovatriptan (FROVA) 2.5 MG tablet, Take 1 tablet by mouth as needed., Disp: , Rfl:  .  Insulin Degludec (TRESIBA FLEXTOUCH) 200 UNIT/ML SOPN, Inject 15 Units into the skin daily., Disp: 18 mL, Rfl: 1 .  Insulin Pen Needle (NOVOFINE) 32G X 6 MM MISC, 2 each by Does not apply route daily., Disp: 200 each, Rfl: 2 .  irbesartan-hydrochlorothiazide (AVALIDE) 300-12.5 MG tablet, Take 1 tablet by mouth daily., Disp: 90 tablet, Rfl: 1 .  Liraglutide (VICTOZA) 18 MG/3ML SOPN, Inject 0.2 mLs (1.2 mg total) into the skin daily., Disp: 18 mL, Rfl: 1 .  metformin (FORTAMET) 1000 MG (OSM) 24 hr tablet, Take 1 tablet (1,000 mg total) by mouth 2 (two) times daily with a meal., Disp: 180 tablet, Rfl: 1 .  ONE TOUCH ULTRA TEST test strip, Use as directed two times  daily, Disp: 100 each, Rfl: 1 .  polyethylene glycol powder (GLYCOLAX/MIRALAX) powder, Take 17 g by mouth 2 (two) times daily as needed. (Patient not taking: Reported on 06/02/2015), Disp: 3350 g, Rfl: 1 .  sertraline (ZOLOFT) 50 MG tablet, Take 1 tablet (50 mg total) by mouth daily., Disp: 90 tablet, Rfl: 1 .  temazepam (RESTORIL) 30 MG capsule, Take 1 capsule (30 mg total) by mouth daily., Disp: 90 capsule, Rfl: 1 .  triamcinolone lotion (KENALOG) 0.1 %, TRIAMCINOLONE ACETONIDE, 0.1% (External Lotion)  1 (one) Lotion Lotion apply to rash twice daily for 30 days  Quantity: 30;  Refills: 0   Ordered :24-January-2014  Alba Cory MD;  Mora Appl 24-January-2014 Active, Disp: , Rfl:  .  Vitamin D, Ergocalciferol, (DRISDOL) 50000 UNITS CAPS capsule, Take 1 capsule (50,000 Units total) by mouth every 7 (seven) days., Disp: 12 capsule, Rfl: 0  Allergies  Allergen Reactions  . Ace Inhibitors      cough  . Bee Venom     Bee  . Dapagliflozin     hematuria     ROS  Constitutional: Negative for fever or weight change.  Respiratory: Negative for cough and shortness of breath.   Cardiovascular: Negative for chest pain or palpitations.  Gastrointestinal: Positive  for abdominal discomfort, no bowel changes.  Musculoskeletal: Negative for gait problem or joint swelling.  Skin: Negative for rash.  Neurological: Negative for dizziness or headache.  No other specific complaints in a complete review of systems (except as listed in HPI above).  Objective  Filed Vitals:   09/05/15 0809  BP: 120/76  Pulse: 91  Temp: 98.2 F (36.8 C)  TempSrc: Oral  Resp: 16  Height: 5\' 7"  (1.702 m)  Weight: 208 lb 11.2 oz (94.666 kg)  SpO2: 98%    Body mass index is 32.68 kg/(m^2).  Physical Exam  Constitutional: Patient appears well-developed and well-nourished. Obese  No distress.  HEENT: head atraumatic, normocephalic, pupils equal and reactive to light, neck supple, throat within normal limits Cardiovascular: Normal rate, regular rhythm and normal heart sounds.  No murmur heard. No BLE edema. Pulmonary/Chest: Effort normal and breath sounds normal. No respiratory distress. Abdominal: Soft.  There is no tenderness. Psychiatric: Patient has a normal mood and affect. behavior is normal. Judgment and thought content normal.   Recent Results (from the past 2160 hour(s))  Hepatitis A antibody, total     Status: None   Collection Time: 08/29/15  3:04 PM  Result Value Ref Range   Hep A Total Ab Negative Negative  Iron and TIBC     Status: None   Collection Time: 08/29/15  3:04 PM  Result Value Ref Range   Total Iron Binding Capacity 356 250 - 450 ug/dL   UIBC 161 096 - 045 ug/dL   Iron 54 27 - 409 ug/dL   Iron Saturation 15 15 - 55 %  IgG, IgA, IgM     Status: Abnormal   Collection Time: 08/29/15  3:04 PM  Result Value Ref Range   IgG (Immunoglobin G), Serum 914 700 - 1600 mg/dL    IgA/Immunoglobulin A, Serum 152 87 - 352 mg/dL   IgM (Immunoglobin M), Srm 23 (L) 26 - 217 mg/dL    Comment: Result confirmed on concentration.  Protime-INR     Status: None   Collection Time: 08/29/15  3:04 PM  Result Value Ref Range   INR 1.0 0.8 - 1.2    Comment: Reference interval is for non-anticoagulated patients. Suggested INR therapeutic range for Vitamin K antagonist therapy:    Standard Dose (moderate intensity                   therapeutic range):       2.0 - 3.0    Higher intensity therapeutic range       2.5 - 3.5    Prothrombin Time 10.1 9.1 - 12.0 sec  Hepatitis B surface antibody     Status: None   Collection Time: 08/29/15  3:04 PM  Result Value Ref Range   Hep B Surface Ab, Qual Reactive     Comment:               Non Reactive: Inconsistent with immunity,                             less than 10 mIU/mL               Reactive:     Consistent with immunity,                             greater than 9.9 mIU/mL   Anti-smooth muscle antibody, IgG     Status: Abnormal   Collection Time: 08/29/15  3:04 PM  Result Value Ref Range   Smooth Muscle Ab 33 (H) 0 - 19 Units    Comment:                  Negative  0 - 19                  Weak positive               20 - 30                  Moderate to strong positive     >30  Actin Antibodies are found in 52-85% of patients with  autoimmune hepatitis or chronic active hepatitis and  in 22% of patients with primary biliary cirrhosis.   Mitochondrial antibodies     Status: None   Collection Time: 08/29/15  3:04 PM  Result Value Ref Range   Mitochondrial Ab 6.6 0.0 - 20.0 Units    Comment:                                 Negative    0.0 - 20.0                                 Equivocal  20.1 - 24.9                                 Positive         >24.9 Mitochondrial (M2) Antibodies are found in 90-96% of patients with primary biliary cirrhosis.   ANA     Status: None   Collection Time: 08/29/15  3:04  PM  Result Value Ref Range   Anit Nuclear Antibody(ANA) Negative Negative  Ceruloplasmin     Status: None   Collection Time: 08/29/15  3:04 PM  Result Value Ref Range   Ceruloplasmin 36.0 19.0 - 39.0 mg/dL  [id number]     Status: None   Collection Time: 08/29/15  3:04 PM  Result Value Ref Range   A-1 Antitrypsin 161 90 - 200 mg/dL  Ferritin     Status: None   Collection Time: 08/29/15  3:04 PM  Result Value Ref Range   Ferritin 46 15 - 150 ng/mL  Hepatitis B surface antigen     Status: None   Collection Time: 08/29/15  3:04 PM  Result Value Ref Range   Hepatitis B Surface Ag Negative Negative  POCT glycosylated hemoglobin (Hb A1C)     Status: Abnormal   Collection Time: 09/05/15  8:17 AM  Result Value Ref Range   Hemoglobin A1C 8.5      PHQ2/9: Depression screen [facility] 2/9 09/05/2015 06/02/2015 03/03/2015  Decreased Interest 0 0 0  Down, Depressed, Hopeless 0 0 0  PHQ - 2 Score 0 0 0     Fall Risk: Fall Risk  09/05/2015 06/02/2015 03/03/2015  Falls in the past year? No No No      Functional Status Survey: Is the patient deaf or have difficulty hearing?: No Does the patient have difficulty seeing, even when wearing glasses/contacts?: No Does the patient have difficulty concentrating, remembering, or making decisions?: No Does the patient have difficulty walking or climbing stairs?: No Does the patient have difficulty dressing or bathing?: No Does the patient have difficulty doing errands alone such as visiting a doctor's office or shopping?: No    Assessment & Plan  1. Diabetic polyneuropathy associated with type 2 diabetes mellitus (HCC)  We will adjust Evaristo Bury slowly  - POCT glycosylated hemoglobin (Hb A1C) -  Liraglutide (VICTOZA) 18 MG/3ML SOPN; Inject 0.2 mLs (1.2 mg total) into the skin daily.  Dispense: 18 mL; Refill: 1 - metformin (FORTAMET) 1000 MG (OSM) 24 hr tablet; Take 1 tablet (1,000 mg total) by mouth 2 (two) times daily with a meal.  Dispense: 180  tablet; Refill: 1 - Insulin Degludec (TRESIBA FLEXTOUCH) 200 UNIT/ML SOPN; Inject 15 Units into the skin daily.  Dispense: 18 mL; Refill: 1  2. Benign hypertension  - amLODipine (NORVASC) 2.5 MG tablet; Take 1 tablet (2.5 mg total) by mouth daily.  Dispense: 90 tablet; Refill: 1 - irbesartan-hydrochlorothiazide (AVALIDE) 300-12.5 MG tablet; Take 1 tablet by mouth daily.  Dispense: 90 tablet; Refill: 1  3. Insomnia, persistent  - temazepam (RESTORIL) 30 MG capsule; Take 1 capsule (30 mg total) by mouth daily.  Dispense: 90 capsule; Refill: 1  4. Dyslipidemia  - atorvastatin (LIPITOR) 40 MG tablet; Take 1 tablet (40 mg total) by mouth daily.  Dispense: 90 tablet; Refill: 1  5. Gastroesophageal reflux disease without esophagitis  - dexlansoprazole (DEXILANT) 60 MG capsule; Take 1 capsule (60 mg total) by mouth daily.  Dispense: 90 capsule; Refill: 1  6. Need for Streptococcus pneumoniae vaccination  - Pneumococcal conjugate vaccine 13-valent IM  7. Menopausal symptom  - sertraline (ZOLOFT) 50 MG tablet; Take 1 tablet (50 mg total) by mouth daily.  Dispense: 90 tablet; Refill: 1

## 2015-09-07 ENCOUNTER — Other Ambulatory Visit: Payer: Self-pay | Admitting: Family Medicine

## 2015-09-07 ENCOUNTER — Encounter: Payer: Self-pay | Admitting: Family Medicine

## 2015-09-07 MED ORDER — INSULIN GLARGINE 300 UNIT/ML ~~LOC~~ SOPN: 15.0000 [IU] | PEN_INJECTOR | Freq: Every day | SUBCUTANEOUS | Status: DC

## 2015-09-26 ENCOUNTER — Telehealth: Payer: Self-pay

## 2015-09-26 NOTE — Telephone Encounter (Signed)
LVM for pt to return my call to schedule a follow up appt after her HIDA scan has been done. Advised pt we have an opening on March 8th.

## 2015-09-26 NOTE — Telephone Encounter (Signed)
Pt returned call and scheduled a follow up on March 8th at 8:30am.

## 2015-09-26 NOTE — Telephone Encounter (Signed)
-----   Message from Midge Minium, MD sent at 09/01/2015 12:58 PM EST ----- This patient needs an office visit to discuss her blood work results.

## 2015-10-02 ENCOUNTER — Encounter
Admission: RE | Admit: 2015-10-02 | Discharge: 2015-10-02 | Disposition: A | Discharge status: Home / Self Care | Payer: 59 | Source: Ambulatory Visit | Attending: Gastroenterology | Admitting: Gastroenterology

## 2015-10-02 DIAGNOSIS — R1084 Generalized abdominal pain: Secondary | ICD-10-CM | POA: Diagnosis present

## 2015-10-02 MED ORDER — TECHNETIUM TC 99M MEBROFENIN IV KIT
5.1500 | PACK | Freq: Once | INTRAVENOUS | Status: AC | PRN
  Administered 2015-10-02: 5.15 via INTRAVENOUS

## 2015-10-02 MED ORDER — SINCALIDE 5 MCG IJ SOLR
0.0200 ug/kg | Freq: Once | INTRAMUSCULAR | Status: AC
  Administered 2015-10-02: 1.89 ug via INTRAVENOUS

## 2015-10-04 ENCOUNTER — Encounter: Payer: Self-pay | Admitting: Gastroenterology

## 2015-10-04 ENCOUNTER — Telehealth: Payer: Self-pay

## 2015-10-04 ENCOUNTER — Ambulatory Visit (INDEPENDENT_AMBULATORY_CARE_PROVIDER_SITE_OTHER): Payer: 59 | Admitting: Gastroenterology

## 2015-10-04 VITALS — BP 120/78 | HR 93 | Temp 98.6°F | Ht 67.0 in | Wt 207.0 lb

## 2015-10-04 DIAGNOSIS — R748 Abnormal levels of other serum enzymes: Secondary | ICD-10-CM | POA: Diagnosis not present

## 2015-10-04 NOTE — Telephone Encounter (Signed)
Pt was notified of HIDA scan today 10/04/15 during her follow up appt.

## 2015-10-04 NOTE — Telephone Encounter (Signed)
-----   Message from Midge Miniumarren Wohl, MD sent at 10/03/2015  2:23 PM EST ----- The patient know that her gallbladder emptying study was normal.

## 2015-10-04 NOTE — Progress Notes (Signed)
Primary Care Physician: Ruel Favors, MD  Primary Gastroenterologist:  Dr. Midge Minium  Chief Complaint  Patient presents with  . Follow up lab results    HPI: Joyce Blackburn is a 55 y.o. female here for follow-up of abnormal liver enzymes and nausea. The patient states she has been doing better on Dexilant with less nausea. The patient also reports that she had the HIDA scan. HIDA scan showed ejection fraction of the gallbladder to be normal at 79%. The patient also reports that she had some back pain and right lower abdominal pain during the exam but she thinks this may have been due to laying on the procedure table for 2 hours. The patient has been doing well otherwise. The patient's liver enzymes had been high in November the workup showed only a positive smooth muscle antibody at 33.  Current Outpatient Prescriptions  Medication Sig Dispense Refill  . albuterol (PROVENTIL HFA;VENTOLIN HFA) 108 (90 BASE) MCG/ACT inhaler Inhale 2 puffs into the lungs every 6 (six) hours as needed for wheezing or shortness of breath. 1 Inhaler 0  . amLODipine (NORVASC) 2.5 MG tablet Take 1 tablet (2.5 mg total) by mouth daily. 90 tablet 1  . Armodafinil 150 MG tablet Take 1 tablet (150 mg total) by mouth daily. 30 tablet 2  . aspirin 81 MG tablet Take 81 mg by mouth daily.    Marland Kitchen atorvastatin (LIPITOR) 40 MG tablet Take 1 tablet (40 mg total) by mouth daily. 90 tablet 1  . dexlansoprazole (DEXILANT) 60 MG capsule Take 1 capsule (60 mg total) by mouth daily. 90 capsule 1  . EPINEPHRINE, ANAPHYLAXIS THERAPY AGENTS, EPIPEN 2-PAK, 0.3MG /0.3ML (Injection Device)  1 Device use as directed for 30 days  Quantity: 2;  Refills: 1   Ordered :06-Mar-2012  Alba Cory MD;  Started 06-Mar-2012 Active Comments: DX: 989.5    . frovatriptan (FROVA) 2.5 MG tablet Take 1 tablet by mouth as needed.    . Insulin Glargine (TOUJEO SOLOSTAR) 300 UNIT/ML SOPN Inject 15 Units into the skin daily. 9 pen 1  . Insulin Pen  Needle (NOVOFINE) 32G X 6 MM MISC 2 each by Does not apply route daily. 200 each 2  . irbesartan-hydrochlorothiazide (AVALIDE) 300-12.5 MG tablet Take 1 tablet by mouth daily. 90 tablet 1  . Liraglutide (VICTOZA) 18 MG/3ML SOPN Inject 0.2 mLs (1.2 mg total) into the skin daily. 18 mL 1  . metformin (FORTAMET) 1000 MG (OSM) 24 hr tablet Take 1 tablet (1,000 mg total) by mouth 2 (two) times daily with a meal. 180 tablet 1  . ONE TOUCH ULTRA TEST test strip Use as directed two times  daily 100 each 1  . polyethylene glycol powder (GLYCOLAX/MIRALAX) powder Take 17 g by mouth 2 (two) times daily as needed. 3350 g 1  . sertraline (ZOLOFT) 50 MG tablet Take 1 tablet (50 mg total) by mouth daily. 90 tablet 1  . temazepam (RESTORIL) 30 MG capsule Take 1 capsule (30 mg total) by mouth daily. 90 capsule 1  . triamcinolone lotion (KENALOG) 0.1 % TRIAMCINOLONE ACETONIDE, 0.1% (External Lotion)  1 (one) Lotion Lotion apply to rash twice daily for 30 days  Quantity: 30;  Refills: 0   Ordered :24-January-2014  Alba Cory MD;  Mora Appl 24-January-2014 Active    . Vitamin D, Ergocalciferol, (DRISDOL) 50000 UNITS CAPS capsule Take 1 capsule (50,000 Units total) by mouth every 7 (seven) days. 12 capsule 0   No current facility-administered medications for this visit.  Allergies as of 10/04/2015 - Review Complete 10/04/2015  Allergen Reaction Noted  . Ace inhibitors  02/14/2015  . Bee venom  02/14/2015  . Dapagliflozin  02/14/2015    ROS:  General: Negative for anorexia, weight loss, fever, chills, fatigue, weakness. ENT: Negative for hoarseness, difficulty swallowing , nasal congestion. CV: Negative for chest pain, angina, palpitations, dyspnea on exertion, peripheral edema.  Respiratory: Negative for dyspnea at rest, dyspnea on exertion, cough, sputum, wheezing.  GI: See history of present illness. GU:  Negative for dysuria, hematuria, urinary incontinence, urinary frequency, nocturnal urination.    Endo: Negative for unusual weight change.    Physical Examination:   BP 120/78 mmHg  Pulse 93  Temp(Src) 98.6 F (37 C) (Oral)  Ht 5\' 7"  (1.702 m)  Wt 207 lb (93.895 kg)  BMI 32.41 kg/m2  General: Well-nourished, well-developed in no acute distress.  Eyes: No icterus. Conjunctivae pink. Mouth: Oropharyngeal mucosa moist and pink , no lesions erythema or exudate. Lungs: Clear to auscultation bilaterally. Non-labored. Heart: Regular rate and rhythm, no murmurs rubs or gallops.  Abdomen: Bowel sounds are normal, nontender, nondistended, no hepatosplenomegaly or masses, no abdominal bruits or hernia , no rebound or guarding.   Extremities: No lower extremity edema. No clubbing or deformities. Neuro: Alert and oriented x 3.  Grossly intact. Skin: Warm and dry, no jaundice.   Psych: Alert and cooperative, normal mood and affect.  Labs:    Imaging Studies: Nm Hepato W/eject Fract  10/02/2015  CLINICAL DATA:  Back pain, right side pain, abdominal discomfort. EXAM: NUCLEAR MEDICINE HEPATOBILIARY IMAGING WITH GALLBLADDER EF TECHNIQUE: Sequential images of the abdomen were obtained out to 60 minutes following intravenous administration of radiopharmaceutical. After oral ingestion of Ensure, gallbladder ejection fraction was determined. At 60 min, normal ejection fraction is greater than 33%. RADIOPHARMACEUTICALS:  5.15 mCi Tc-7193m  Choletec IV COMPARISON:  Ultrasound 07/07/2015 FINDINGS: Gallbladder ejection fraction: 79%. Normal gallbladder ejection fraction with Ensure is greater than 33%. Normal uptake and excretion of radiotracer by the liver. No evidence of biliary obstruction. IMPRESSION: Normal study. Electronically Signed   By: Charlett NoseKevin  Dover M.D.   On: 10/02/2015 13:12    Assessment and Plan:   Joyce Blackburn is a 55 y.o. y/o female who comes here with abnormal liver enzymes and fatty liver with a normal HIDA scan and gallbladder ejection fraction. The patient's nausea is better with the  Dexilant that she will continue that for. The patient has been told about the SMA and its relation to autoimmune hepatitis. The patient will have her liver enzymes checked again today and if elevated she will be considered for a liver biopsy to rule out autoimmune hepatitis. Otherwise if it is back to normal she has been told that weight loss and keeping her diabetes and cholesterol under control are the best ways to decrease the fat in her liver. The patient states she understands the plan and agrees with it.   Note: This dictation was prepared with Dragon dictation along with smaller phrase technology. Any transcriptional errors that result from this process are unintentional.

## 2015-10-05 LAB — HEPATIC FUNCTION PANEL
ALBUMIN: 4.3 g/dL (ref 3.5–5.5)
ALK PHOS: 120 IU/L — AB (ref 39–117)
ALT: 24 IU/L (ref 0–32)
AST: 17 IU/L (ref 0–40)
Bilirubin, Direct: 0.07 mg/dL (ref 0.00–0.40)
Total Protein: 6.7 g/dL (ref 6.0–8.5)

## 2015-10-06 ENCOUNTER — Telehealth: Payer: Self-pay

## 2015-10-06 NOTE — Telephone Encounter (Signed)
-----   Message from Midge Miniumarren Wohl, MD sent at 10/06/2015 12:05 PM EST ----- Let the patient know the labs are almost back to normal. Repeat in 3 months.

## 2015-10-06 NOTE — Telephone Encounter (Signed)
Pt notified of lab results. Will call pt in 3 months for a repeat LFT's.

## 2015-10-23 ENCOUNTER — Encounter: Payer: Self-pay | Admitting: Family Medicine

## 2015-12-01 ENCOUNTER — Other Ambulatory Visit: Payer: Self-pay | Admitting: Family Medicine

## 2015-12-01 ENCOUNTER — Encounter: Payer: Self-pay | Admitting: Family Medicine

## 2015-12-01 ENCOUNTER — Ambulatory Visit (INDEPENDENT_AMBULATORY_CARE_PROVIDER_SITE_OTHER): Payer: 59 | Admitting: Family Medicine

## 2015-12-01 VITALS — BP 110/68 | HR 85 | Temp 97.8°F | Resp 16 | Ht 67.0 in | Wt 204.7 lb

## 2015-12-01 DIAGNOSIS — E1142 Type 2 diabetes mellitus with diabetic polyneuropathy: Secondary | ICD-10-CM

## 2015-12-01 DIAGNOSIS — J302 Other seasonal allergic rhinitis: Secondary | ICD-10-CM

## 2015-12-01 DIAGNOSIS — I10 Essential (primary) hypertension: Secondary | ICD-10-CM | POA: Diagnosis not present

## 2015-12-01 DIAGNOSIS — B001 Herpesviral vesicular dermatitis: Secondary | ICD-10-CM

## 2015-12-01 DIAGNOSIS — Z1239 Encounter for other screening for malignant neoplasm of breast: Secondary | ICD-10-CM

## 2015-12-01 DIAGNOSIS — E785 Hyperlipidemia, unspecified: Secondary | ICD-10-CM | POA: Diagnosis not present

## 2015-12-01 DIAGNOSIS — G4700 Insomnia, unspecified: Secondary | ICD-10-CM

## 2015-12-01 LAB — POCT GLYCOSYLATED HEMOGLOBIN (HGB A1C): HEMOGLOBIN A1C: 7.4

## 2015-12-01 MED ORDER — MONTELUKAST SODIUM 10 MG PO TABS: 10.0000 mg | ORAL_TABLET | Freq: Every day | ORAL | Status: DC

## 2015-12-01 MED ORDER — LORATADINE 10 MG PO TABS: 10.0000 mg | ORAL_TABLET | Freq: Every day | ORAL | Status: AC

## 2015-12-01 MED ORDER — VALACYCLOVIR HCL 1 G PO TABS: 1000.0000 mg | ORAL_TABLET | Freq: Two times a day (BID) | ORAL | Status: DC

## 2015-12-01 NOTE — Progress Notes (Signed)
Name: Joyce Blackburn   MRN: [id number]    DOB: 07/28/1961   Date:12/01/2015       Progress Note  Subjective  Chief Complaint  Chief Complaint  Patient presents with  . Diabetes    patient is here for her 1240-month f/u. she checks her blood sugar daily. highest: 135 & lowest: 100.  . Medication Refill    HPI  AR: she has noticed some dry cough at most once a day, some sneezing and nasal congestion, not taking any medication at this time. She has some watery eyes  DMII: She is checking fsbs at home and is on average fasting is 130's. She has been trying to be compliant with her diet, she is off sweet beverages, only drinking diet Ginger Ale . She denies polyphagia, polydipsia or polyuria. Started on Tresiba  Fall 2016 but will have to switch to Toujoe per insurance request. Compliant with oral medication and also Victoza. Taking ARB. She states that neuropathy pain has improved, only has occasional symptoms. hgbA1C has gone down from 8.5% to 7.4% and is going to have an eye exam next week.    Indigestion: she also has a history of gastric ulcer about 20 years ago. She has noticed recurrence of nausea in the evening and bloating. She was seen by Dr. Lars PinksWhol and had multiple labs done for evaluation of fatty liver, HIDA was normal. She is on Dexilant at this time with some improvement of symptoms, she has the side effect of diarrhea if she over eatsFailed Omeprazole 40 mg   Dyslipidemia: taking Lipitor and is tolerating it well. No myalgia or chest pain  HTN: well controlled, taking medication, denies chest pain or palpitation.   Patient Active Problem List   Diagnosis Date Noted  . Gastroesophageal reflux disease without esophagitis 07/04/2015  . Diabetic polyneuropathy associated with type 2 diabetes mellitus (HCC) 03/03/2015  . Peripheral neuropathy (HCC) 03/03/2015  . Allergic to bees 02/20/2015  . Benign hypertension 02/20/2015  . Insomnia, persistent 02/20/2015  . Chronic idiopathic  constipation 02/20/2015  . Dyslipidemia 02/20/2015  . Dermatitis, eczematoid 02/20/2015  . H/O gastric ulcer 02/20/2015  . Herpes 02/20/2015  . Migraine without aura and responsive to treatment 02/20/2015  . Adult BMI 30+ 02/20/2015  . Obstructive apnea 02/20/2015  . Paresthesia of arm 02/20/2015  . Periodic limb movement 02/20/2015  . Allergic rhinitis, seasonal 02/20/2015  . Menopausal symptom 02/20/2015    Past Surgical History  Procedure Laterality Date  . Abdominal hysterectomy      Family History  Problem Relation Age of Onset  . Mental illness Mother   . Diabetes Mother   . CVA Mother   . Schizophrenia Mother   . Diabetes Sister   . Hypertension Sister   . Diabetes Brother   . Mental illness Brother   . Bipolar disorder Brother   . Schizophrenia Brother     Social History   Social History  . Marital Status: Married    Spouse Name: N/A  . Number of Children: N/A  . Years of Education: N/A   Occupational History  . Not on file.   Social History Main Topics  . Smoking status: Former Smoker -- 0.10 packs/day for 15 years    Quit date: 07/29/2004  . Smokeless tobacco: Never Used  . Alcohol Use: No  . Drug Use: No  . Sexual Activity: Yes   Other Topics Concern  . Not on file   Social History Narrative  Current outpatient prescriptions:  .  albuterol (PROVENTIL HFA;VENTOLIN HFA) 108 (90 BASE) MCG/ACT inhaler, Inhale 2 puffs into the lungs every 6 (six) hours as needed for wheezing or shortness of breath., Disp: 1 Inhaler, Rfl: 0 .  amLODipine (NORVASC) 2.5 MG tablet, Take 1 tablet (2.5 mg total) by mouth daily., Disp: 90 tablet, Rfl: 1 .  Armodafinil 150 MG tablet, Take 1 tablet (150 mg total) by mouth daily., Disp: 30 tablet, Rfl: 2 .  aspirin 81 MG tablet, Take 81 mg by mouth daily., Disp: , Rfl:  .  atorvastatin (LIPITOR) 40 MG tablet, Take 1 tablet (40 mg total) by mouth daily., Disp: 90 tablet, Rfl: 1 .  dexlansoprazole (DEXILANT) 60 MG  capsule, Take 1 capsule (60 mg total) by mouth daily., Disp: 90 capsule, Rfl: 1 .  EPINEPHRINE, ANAPHYLAXIS THERAPY AGENTS,, EPIPEN 2-PAK, 0.3MG /0.3ML (Injection Device)  1 Device use as directed for 30 days  Quantity: 2;  Refills: 1   Ordered :06-Mar-2012  Alba Cory MD;  Started 06-Mar-2012 Active Comments: DX: 989.5, Disp: , Rfl:  .  frovatriptan (FROVA) 2.5 MG tablet, Take 1 tablet by mouth as needed., Disp: , Rfl:  .  Insulin Glargine (TOUJEO SOLOSTAR) 300 UNIT/ML SOPN, Inject 15 Units into the skin daily., Disp: 9 pen, Rfl: 1 .  Insulin Pen Needle (NOVOFINE) 32G X 6 MM MISC, 2 each by Does not apply route daily., Disp: 200 each, Rfl: 2 .  irbesartan-hydrochlorothiazide (AVALIDE) 300-12.5 MG tablet, Take 1 tablet by mouth daily., Disp: 90 tablet, Rfl: 1 .  Liraglutide (VICTOZA) 18 MG/3ML SOPN, Inject 0.2 mLs (1.2 mg total) into the skin daily., Disp: 18 mL, Rfl: 1 .  loratadine (CLARITIN) 10 MG tablet, Take 1 tablet (10 mg total) by mouth daily., Disp: 30 tablet, Rfl: 11 .  metformin (FORTAMET) 1000 MG (OSM) 24 hr tablet, Take 1 tablet (1,000 mg total) by mouth 2 (two) times daily with a meal., Disp: 180 tablet, Rfl: 1 .  montelukast (SINGULAIR) 10 MG tablet, Take 1 tablet (10 mg total) by mouth at bedtime., Disp: 90 tablet, Rfl: 1 .  ONE TOUCH ULTRA TEST test strip, Use as directed two times  daily, Disp: 100 each, Rfl: 1 .  polyethylene glycol powder (GLYCOLAX/MIRALAX) powder, Take 17 g by mouth 2 (two) times daily as needed., Disp: 3350 g, Rfl: 1 .  sertraline (ZOLOFT) 50 MG tablet, Take 1 tablet (50 mg total) by mouth daily., Disp: 90 tablet, Rfl: 1 .  temazepam (RESTORIL) 30 MG capsule, Take 1 capsule (30 mg total) by mouth daily., Disp: 90 capsule, Rfl: 1 .  triamcinolone lotion (KENALOG) 0.1 %, TRIAMCINOLONE ACETONIDE, 0.1% (External Lotion)  1 (one) Lotion Lotion apply to rash twice daily for 30 days  Quantity: 30;  Refills: 0   Ordered :24-January-2014  Alba Cory MD;  Mora Appl  24-January-2014 Active, Disp: , Rfl:  .  valACYclovir (VALTREX) 1000 MG tablet, Take 1 tablet (1,000 mg total) by mouth 2 (two) times daily. Prn, Disp: 20 tablet, Rfl: 0  Allergies  Allergen Reactions  . Ace Inhibitors     cough  . Bee Venom     Bee  . Dapagliflozin     hematuria     ROS  Constitutional: Negative for fever, positive for weight change.  Respiratory: Positive  for cough no  shortness of breath.   Cardiovascular: Negative for chest pain or palpitations.  Gastrointestinal: Negative for abdominal pain, no bowel changes.  Musculoskeletal: Negative for gait problem or joint swelling.  Skin: Negative for rash.  Neurological: Negative for dizziness or headache.  No other specific complaints in a complete review of systems (except as listed in HPI above).  Objective  Filed Vitals:   12/01/15 1034  BP: 110/68  Pulse: 85  Temp: 97.8 F (36.6 C)  TempSrc: Oral  Resp: 16  Height:  (1.702 m)  Weight: 204 lb 11.2 oz (92.851 kg)  SpO2: 98%    Body mass index is 32.05 kg/(m^2).  Physical Exam  Constitutional: Patient appears well-developed and well-nourished. Obese  No distress.  HEENT: head atraumatic, normocephalic, pupils equal and reactive to light,  neck supple, throat within normal limits Cardiovascular: Normal rate, regular rhythm and normal heart sounds.  No murmur heard. No BLE edema. Pulmonary/Chest: Effort normal and breath sounds normal. No respiratory distress. Abdominal: Soft.  There is no tenderness. Psychiatric: Patient has a normal mood and affect. behavior is normal. Judgment and thought content normal.  Recent Results (from the past 2160 hour(s))  POCT glycosylated hemoglobin (Hb A1C)     Status: Abnormal   Collection Time: 09/05/15  8:17 AM  Result Value Ref Range   Hemoglobin A1C 8.5   Hepatic function panel     Status: Abnormal   Collection Time: 10/04/15  8:44 AM  Result Value Ref Range   Total Protein 6.7 6.0 - 8.5 g/dL   Albumin  4.3 3.5 - 5.5 g/dL   Bilirubin Total <4.0 0.0 - 1.2 mg/dL   Bilirubin, Direct 9.81 0.00 - 0.40 mg/dL   Alkaline Phosphatase 120 (H) 39 - 117 IU/L   AST 17 0 - 40 IU/L   ALT 24 0 - 32 IU/L    PHQ2/9: Depression screen [facility] 2/9 12/01/2015 09/05/2015 06/02/2015 03/03/2015  Decreased Interest 0 0 0 0  Down, Depressed, Hopeless 0 0 0 0  PHQ - 2 Score 0 0 0 0     Fall Risk: Fall Risk  12/01/2015 09/05/2015 06/02/2015 03/03/2015  Falls in the past year? No No No No     Functional Status Survey: Is the patient deaf or have difficulty hearing?: No Does the patient have difficulty seeing, even when wearing glasses/contacts?: No Does the patient have difficulty concentrating, remembering, or making decisions?: No Does the patient have difficulty walking or climbing stairs?: No Does the patient have difficulty dressing or bathing?: No Does the patient have difficulty doing errands alone such as visiting a doctor's office or shopping?: No    Assessment & Plan  1. Diabetic polyneuropathy associated with type 2 diabetes mellitus (HCC)  Doing great, advised to increase Tresiba from 12 units gradually to 15 units to keep fasting between 100-120. Continue diet - POCT glycosylated hemoglobin (Hb A1C) - POCT UA - Microalbumin  2. Insomnia, persistent  Doing well on Temazepam   3. Benign hypertension  Well controlled  4. Dyslipidemia  On Atorvastatin  5. Allergic rhinitis, seasonal  Causing dry cough, if continues we will do a Spirometry  - loratadine (CLARITIN) 10 MG tablet; Take 1 tablet (10 mg total) by mouth daily.  Dispense: 30 tablet; Refill: 11 - montelukast (SINGULAIR) 10 MG tablet; Take 1 tablet (10 mg total) by mouth at bedtime.  Dispense: 90 tablet; Refill: 1  6. Fever blister  - valACYclovir (VALTREX) 1000 MG tablet; Take 1 tablet (1,000 mg total) by mouth 2 (two) times daily. Prn  Dispense: 20 tablet; Refill: 0

## 2016-01-22 ENCOUNTER — Other Ambulatory Visit: Payer: Self-pay | Admitting: Family Medicine

## 2016-01-22 NOTE — Telephone Encounter (Signed)
Patient requesting refill. 

## 2016-01-25 ENCOUNTER — Other Ambulatory Visit: Payer: Self-pay

## 2016-01-25 DIAGNOSIS — R748 Abnormal levels of other serum enzymes: Secondary | ICD-10-CM

## 2016-01-30 LAB — HEPATIC FUNCTION PANEL
ALBUMIN: 4 g/dL (ref 3.5–5.5)
ALK PHOS: 135 IU/L — AB (ref 39–117)
ALT: 23 IU/L (ref 0–32)
AST: 19 IU/L (ref 0–40)
BILIRUBIN, DIRECT: 0.07 mg/dL (ref 0.00–0.40)
TOTAL PROTEIN: 6.6 g/dL (ref 6.0–8.5)

## 2016-01-31 ENCOUNTER — Telehealth: Payer: Self-pay

## 2016-01-31 NOTE — Telephone Encounter (Signed)
-----   Message from Midge Miniumarren Wohl, MD sent at 01/30/2016  8:23 AM EDT ----- All of the liver enzymes are normal except the alkaline phosphatase.  The patient Should have her blood checked for the fractionation of the alkaline phosphatase to see if it's from bone or liver.

## 2016-01-31 NOTE — Telephone Encounter (Signed)
LVM for pt to return my call.

## 2016-01-31 NOTE — Telephone Encounter (Signed)
Pt returned call and notified of results and the add on test.

## 2016-02-02 LAB — ALKALINE PHOSPHATASE, ISOENZYMES
Alkaline Phosphatase: 136 IU/L — ABNORMAL HIGH (ref 39–117)
BONE FRACTION: 23 % (ref 14–68)
INTESTINAL FRAC.: 6 % (ref 0–18)
LIVER FRACTION: 71 % (ref 18–85)

## 2016-02-02 LAB — SPECIMEN STATUS REPORT

## 2016-02-08 ENCOUNTER — Telehealth: Payer: Self-pay

## 2016-02-08 NOTE — Telephone Encounter (Signed)
-----   Message from Midge Miniumarren Wohl, MD sent at 02/08/2016 12:49 PM EDT ----- Let the patient know that the blood test showed that the elevated enzyme was from the liver and we should have it checked again in 3 months.

## 2016-02-08 NOTE — Telephone Encounter (Signed)
Pt notified of lab result. Added to recall list to repeat LFT's.

## 2016-03-04 ENCOUNTER — Encounter: Payer: Self-pay | Admitting: Family Medicine

## 2016-03-04 ENCOUNTER — Other Ambulatory Visit: Payer: Self-pay | Admitting: Family Medicine

## 2016-03-04 ENCOUNTER — Ambulatory Visit (INDEPENDENT_AMBULATORY_CARE_PROVIDER_SITE_OTHER): Payer: 59 | Admitting: Family Medicine

## 2016-03-04 ENCOUNTER — Ambulatory Visit
Admission: RE | Admit: 2016-03-04 | Discharge: 2016-03-04 | Disposition: A | Discharge status: Home / Self Care | Payer: 59 | Source: Ambulatory Visit | Attending: Family Medicine | Admitting: Family Medicine

## 2016-03-04 VITALS — BP 116/74 | HR 96 | Temp 98.6°F | Resp 18 | Ht 67.0 in | Wt 205.6 lb

## 2016-03-04 DIAGNOSIS — N6489 Other specified disorders of breast: Secondary | ICD-10-CM

## 2016-03-04 DIAGNOSIS — B001 Herpesviral vesicular dermatitis: Secondary | ICD-10-CM

## 2016-03-04 DIAGNOSIS — E1142 Type 2 diabetes mellitus with diabetic polyneuropathy: Secondary | ICD-10-CM | POA: Diagnosis not present

## 2016-03-04 DIAGNOSIS — N951 Menopausal and female climacteric states: Secondary | ICD-10-CM | POA: Diagnosis not present

## 2016-03-04 DIAGNOSIS — R928 Other abnormal and inconclusive findings on diagnostic imaging of breast: Secondary | ICD-10-CM | POA: Diagnosis not present

## 2016-03-04 DIAGNOSIS — G4733 Obstructive sleep apnea (adult) (pediatric): Secondary | ICD-10-CM | POA: Diagnosis not present

## 2016-03-04 DIAGNOSIS — K760 Fatty (change of) liver, not elsewhere classified: Secondary | ICD-10-CM | POA: Diagnosis not present

## 2016-03-04 DIAGNOSIS — Z1231 Encounter for screening mammogram for malignant neoplasm of breast: Secondary | ICD-10-CM | POA: Insufficient documentation

## 2016-03-04 DIAGNOSIS — E785 Hyperlipidemia, unspecified: Secondary | ICD-10-CM

## 2016-03-04 DIAGNOSIS — M7581 Other shoulder lesions, right shoulder: Secondary | ICD-10-CM | POA: Diagnosis not present

## 2016-03-04 DIAGNOSIS — I10 Essential (primary) hypertension: Secondary | ICD-10-CM

## 2016-03-04 DIAGNOSIS — Z1239 Encounter for other screening for malignant neoplasm of breast: Secondary | ICD-10-CM

## 2016-03-04 DIAGNOSIS — K219 Gastro-esophageal reflux disease without esophagitis: Secondary | ICD-10-CM | POA: Diagnosis not present

## 2016-03-04 DIAGNOSIS — G4700 Insomnia, unspecified: Secondary | ICD-10-CM | POA: Diagnosis not present

## 2016-03-04 DIAGNOSIS — E559 Vitamin D deficiency, unspecified: Secondary | ICD-10-CM | POA: Diagnosis not present

## 2016-03-04 LAB — POCT GLYCOSYLATED HEMOGLOBIN (HGB A1C): Hemoglobin A1C: 7.5

## 2016-03-04 IMAGING — CR DG CHEST 2V
1 series · 2 of 2 positions shown · non-contrast
Comparison: None in PACs

CLINICAL DATA: One week of productive cough, previous history of
smoking

EXAM:
CHEST  2 VIEW

[Series 1: dg chest 2 view · 0.14mm/px · 2 of 2 slices shown]
[im 1/2]
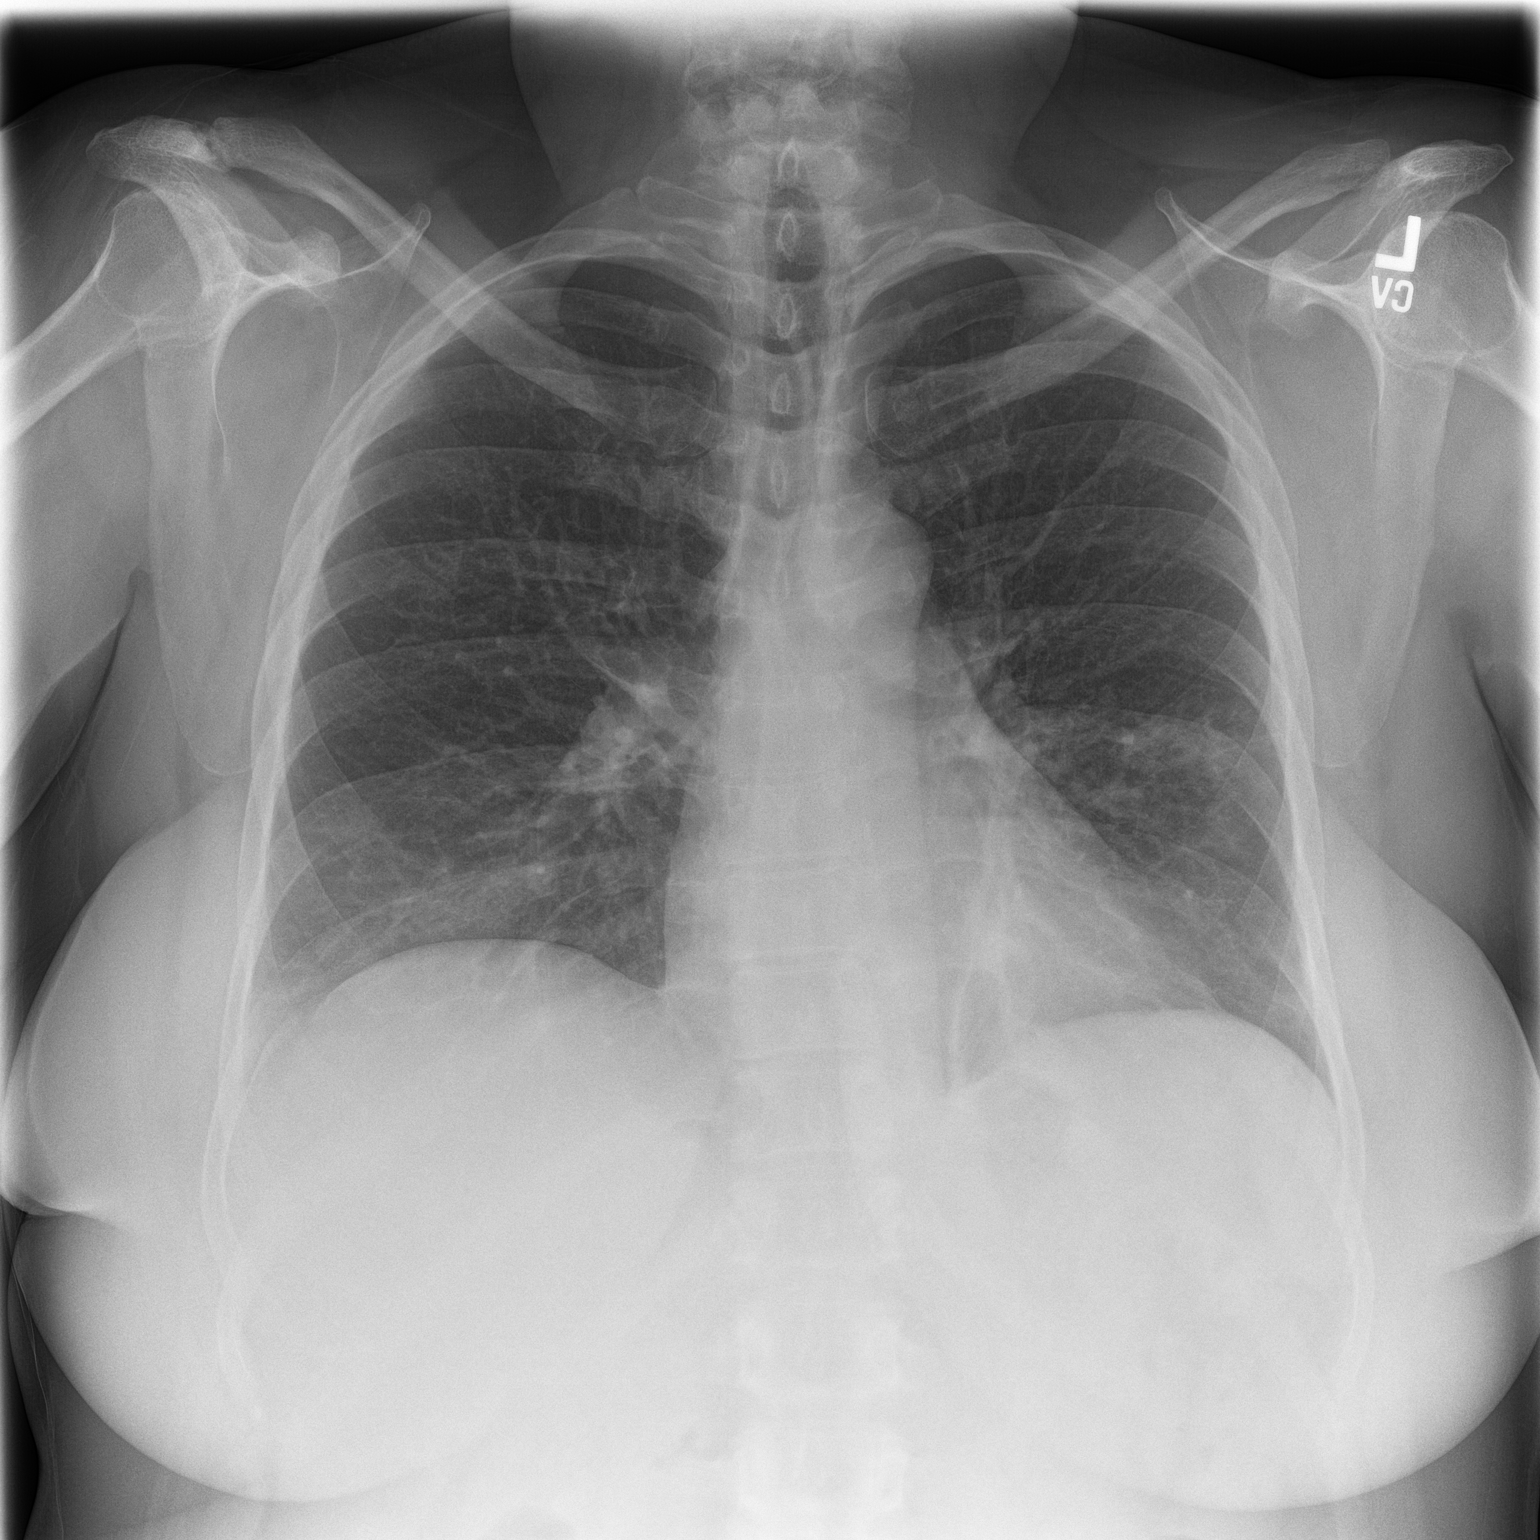
[im 2/2]
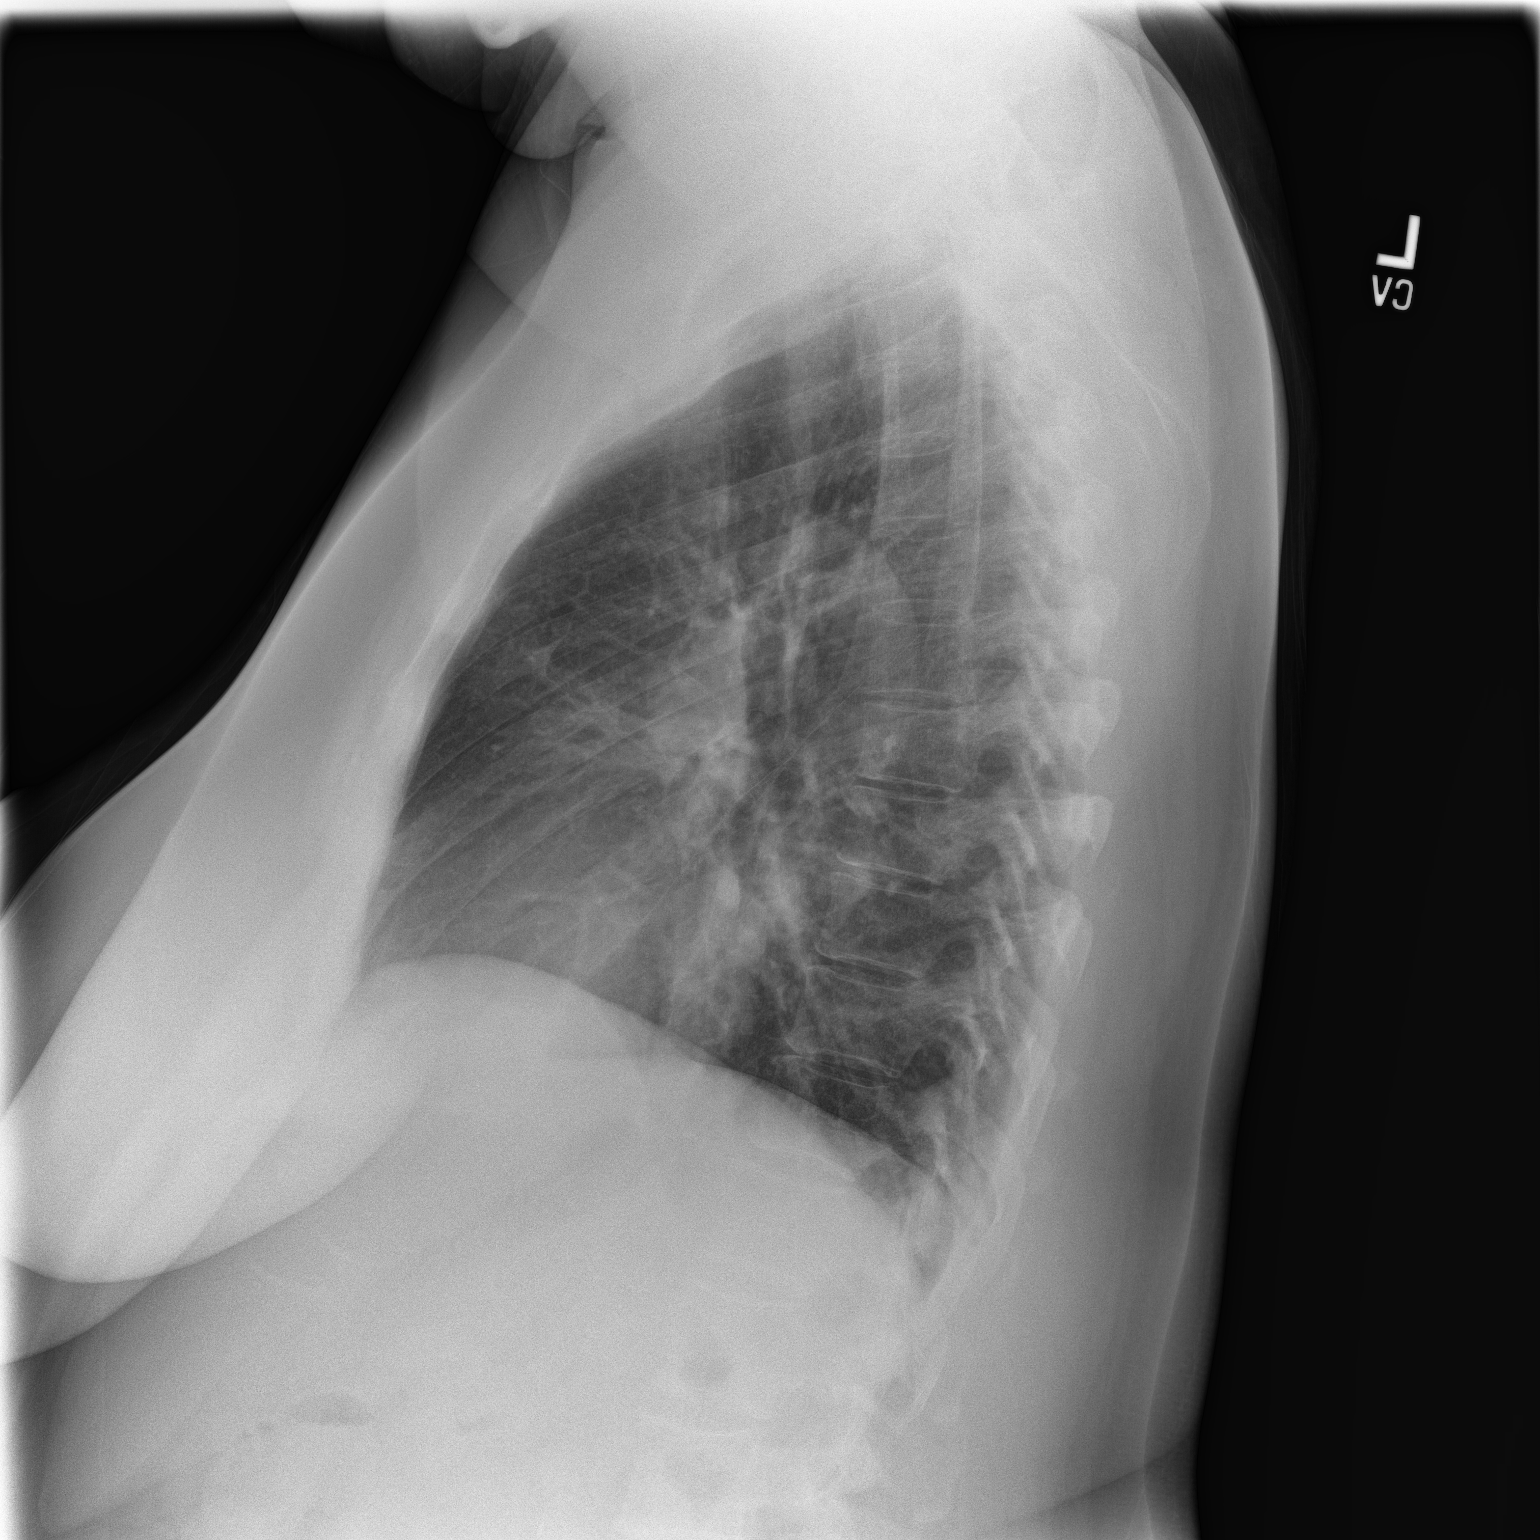

[2 of 2 positions shown; findings below may reference images not displayed]

FINDINGS: The lungs are adequately inflated. There is subtle patchy density in
the left mid lung and left lung base. The heart is normal in size.
The pulmonary vascularity is not engorged. The mediastinum is normal
in width. The bony thorax is unremarkable.
IMPRESSION: Infiltrate in the inferior left upper lobe as well as left lower
lobe pneumonia. Followup PA and lateral chest X-ray is recommended
in 3-4 weeks following trial of antibiotic therapy to ensure
resolution and exclude underlying malignancy.

## 2016-03-04 MED ORDER — LIRAGLUTIDE 18 MG/3ML ~~LOC~~ SOPN: 1.2000 mg | PEN_INJECTOR | Freq: Every day | SUBCUTANEOUS | 1 refills | Status: DC

## 2016-03-04 MED ORDER — VALACYCLOVIR HCL 1 G PO TABS: 1000.0000 mg | ORAL_TABLET | Freq: Two times a day (BID) | ORAL | 0 refills | Status: DC

## 2016-03-04 MED ORDER — ONETOUCH LANCETS MISC: 1.0000 | Freq: Two times a day (BID) | 1 refills | Status: DC

## 2016-03-04 MED ORDER — INSULIN PEN NEEDLE 32G X 6 MM MISC: 1.0000 | Freq: Two times a day (BID) | 1 refills | Status: DC

## 2016-03-04 MED ORDER — TEMAZEPAM 30 MG PO CAPS: 30.0000 mg | ORAL_CAPSULE | Freq: Every day | ORAL | 1 refills | Status: DC

## 2016-03-04 MED ORDER — SERTRALINE HCL 100 MG PO TABS: 100.0000 mg | ORAL_TABLET | Freq: Every day | ORAL | 1 refills | Status: DC

## 2016-03-04 MED ORDER — GLUCOSE BLOOD VI STRP: 1.0000 | ORAL_STRIP | Freq: Two times a day (BID) | 1 refills | Status: DC

## 2016-03-04 NOTE — Progress Notes (Signed)
Name: Joyce Blackburn   MRN: [id number]    DOB: 04/01/1961   Date:03/04/2016       Progress Note  Subjective  Chief Complaint  Chief Complaint  Patient presents with  . Diabetes    pt here for 3 month follow up  . Hyperlipidemia  . Obesity    HPI   DMII: She is checking fsbs at home and is on average fasting is around 125's and in the pm's 135/ She has been trying to be compliant with her diet, she is still drinking water, but is back drinking a Pepsi - one can daily . She denies polyphagia, polydipsia or polyuria. Started on Tresiba  Fall 2016 but will have to switch to Toujoe per insurance request. Compliant with oral medication and also Victoza. Taking ARB. She states that neuropathy pain has improved, only has occasional symptoms. hgbA1C has gone down from 8.5% to 7.4% and up to 7.5%. She is up to date with eye exam   Indigestion: she also has a history of gastric ulcer about 20 years ago. She has noticed recurrence of nausea in the evening and bloating. She was seen by Dr. Lars Pinks and had multiple labs done for evaluation of fatty liver, HIDA was normal. She is on Dexilant at this time and is doing well, but is causing diarrhea.   Dyslipidemia: taking Lipitor and is tolerating it well. No myalgia or chest pain. She denies dizziness, discussed decreasing dose of medication but she would like to hold off for now.   HTN: well controlled, taking medication, denies chest pain or palpitation.   Vitamin D deficiency: she is taking otc and is feeling better, more energy  Menopause: she states Zoloft was working well initially, but she has noticed that she has been more moody lately and having hot flashes, we will adjust dose of medication  Insomnia: she is taking Temazepam and still has difficulty falling asleep, explained it is the highest dose and we will try increase dose of Zoloft  Right arm pain: she noticed sudden onset of right upper pain and pain with abduction or right arm 2  months ago, started after she picked up her grand-daughter. No weakness or numbness.    Patient Active Problem List   Diagnosis Date Noted  . Fatty liver 03/04/2016  . Vitamin D deficiency 03/04/2016  . Gastroesophageal reflux disease without esophagitis 07/04/2015  . Diabetic polyneuropathy associated with type 2 diabetes mellitus (HCC) 03/03/2015  . Peripheral neuropathy (HCC) 03/03/2015  . Allergic to bees 02/20/2015  . Benign hypertension 02/20/2015  . Insomnia, persistent 02/20/2015  . Chronic idiopathic constipation 02/20/2015  . Dyslipidemia 02/20/2015  . Dermatitis, eczematoid 02/20/2015  . H/O gastric ulcer 02/20/2015  . Herpes 02/20/2015  . Migraine without aura and responsive to treatment 02/20/2015  . Adult BMI 30+ 02/20/2015  . Obstructive apnea 02/20/2015  . Paresthesia of arm 02/20/2015  . Periodic limb movement 02/20/2015  . Allergic rhinitis, seasonal 02/20/2015  . Menopausal symptom 02/20/2015    Past Surgical History:  Procedure Laterality Date  . ABDOMINAL HYSTERECTOMY      Family History  Problem Relation Age of Onset  . Mental illness Mother   . Diabetes Mother   . CVA Mother   . Schizophrenia Mother   . Diabetes Sister   . Hypertension Sister   . Diabetes Brother   . Mental illness Brother   . Bipolar disorder Brother   . Schizophrenia Brother     Social History   Social  History  . Marital status: Married    Spouse name: N/A  . Number of children: N/A  . Years of education: N/A   Occupational History  . Not on file.   Social History Main Topics  . Smoking status: Former Smoker    Packs/day: 0.10    Years: 15.00    Quit date: 07/29/2004  . Smokeless tobacco: Never Used  . Alcohol use No  . Drug use: No  . Sexual activity: Yes   Other Topics Concern  . Not on file   Social History Narrative  . No narrative on file     Current Outpatient Prescriptions:  .  albuterol (PROVENTIL HFA;VENTOLIN HFA) 108 (90 BASE) MCG/ACT  inhaler, Inhale 2 puffs into the lungs every 6 (six) hours as needed for wheezing or shortness of breath., Disp: 1 Inhaler, Rfl: 0 .  amLODipine (NORVASC) 2.5 MG tablet, Take 1 tablet by mouth  daily, Disp: 90 tablet, Rfl: 1 .  Armodafinil 150 MG tablet, Take 1 tablet (150 mg total) by mouth daily., Disp: 30 tablet, Rfl: 2 .  aspirin 81 MG tablet, Take 81 mg by mouth daily., Disp: , Rfl:  .  atorvastatin (LIPITOR) 40 MG tablet, Take 1 tablet by mouth  daily, Disp: 90 tablet, Rfl: 1 .  DEXILANT 60 MG capsule, Take 1 capsule by mouth  daily, Disp: 90 capsule, Rfl: 1 .  EPINEPHRINE, ANAPHYLAXIS THERAPY AGENTS,, EPIPEN 2-PAK, 0.3MG /0.3ML (Injection Device)  1 Device use as directed for 30 days  Quantity: 2;  Refills: 1   Ordered :06-Mar-2012  Alba Cory MD;  Started 06-Mar-2012 Active Comments: DX: 989.5, Disp: , Rfl:  .  frovatriptan (FROVA) 2.5 MG tablet, Take 1 tablet by mouth as needed., Disp: , Rfl:  .  glucose blood (ONE TOUCH ULTRA TEST) test strip, 1 each by Other route 2 (two) times daily. Use as instructed, Disp: 100 each, Rfl: 1 .  Insulin Glargine (TOUJEO SOLOSTAR) 300 UNIT/ML SOPN, Inject 15 Units into the skin daily., Disp: 9 pen, Rfl: 1 .  Insulin Pen Needle (NOVOFINE) 32G X 6 MM MISC, 1 each by Does not apply route 2 (two) times daily., Disp: 100 each, Rfl: 1 .  irbesartan-hydrochlorothiazide (AVALIDE) 300-12.5 MG tablet, Take 1 tablet by mouth  daily, Disp: 90 tablet, Rfl: 1 .  Liraglutide (VICTOZA) 18 MG/3ML SOPN, Inject 0.2 mLs (1.2 mg total) into the skin daily., Disp: 18 mL, Rfl: 1 .  loratadine (CLARITIN) 10 MG tablet, Take 1 tablet (10 mg total) by mouth daily., Disp: 30 tablet, Rfl: 11 .  metformin (FORTAMET) 1000 MG (OSM) 24 hr tablet, Take 1 tablet by mouth two  times daily with meals, Disp: 180 tablet, Rfl: 1 .  montelukast (SINGULAIR) 10 MG tablet, Take 1 tablet (10 mg total) by mouth at bedtime., Disp: 90 tablet, Rfl: 1 .  ONE TOUCH LANCETS MISC, 1 each by Does not apply  route 2 (two) times daily., Disp: 100 each, Rfl: 1 .  sertraline (ZOLOFT) 100 MG tablet, Take 1 tablet (100 mg total) by mouth daily., Disp: 90 tablet, Rfl: 1 .  temazepam (RESTORIL) 30 MG capsule, Take 1 capsule (30 mg total) by mouth daily., Disp: 90 capsule, Rfl: 1 .  triamcinolone lotion (KENALOG) 0.1 %, TRIAMCINOLONE ACETONIDE, 0.1% (External Lotion)  1 (one) Lotion Lotion apply to rash twice daily for 30 days  Quantity: 30;  Refills: 0   Ordered :24-January-2014  Alba Cory MD;  Mora Appl 24-January-2014 Active, Disp: , Rfl:  .  valACYclovir (VALTREX) 1000 MG tablet, Take 1 tablet (1,000 mg total) by mouth 2 (two) times daily. Prn, Disp: 30 tablet, Rfl: 0  Allergies  Allergen Reactions  . Ace Inhibitors     cough  . Bee Venom     Bee  . Dapagliflozin     hematuria     ROS  Constitutional: Negative for fever or weight change.  Respiratory: Negative for cough and shortness of breath.   Cardiovascular: Negative for chest pain or palpitations.  Gastrointestinal: Negative for abdominal pain, no bowel changes.  Musculoskeletal: Negative for gait problem or joint swelling.  Skin: Negative for rash.  Neurological: Negative for dizziness or headache.  No other specific complaints in a complete review of systems (except as listed in HPI above).  Objective  Vitals:   03/04/16 0832  BP: 116/74  Pulse: 96  Resp: 18  Temp: 98.6 F (37 C)  SpO2: 97%  Weight: 205 lb 9 oz (93.2 kg)  Height:  (1.702 m)    Body mass index is 32.2 kg/m.  Physical Exam  Constitutional: Patient appears well-developed and well-nourished. Obese  No distress.  HEENT: head atraumatic, normocephalic, pupils equal and reactive to light,  neck supple, throat within normal limits Cardiovascular: Normal rate, regular rhythm and normal heart sounds.  No murmur heard. No BLE edema. Pulmonary/Chest: Effort normal and breath sounds normal. No respiratory distress. Abdominal: Soft.  There is no  tenderness. Psychiatric: Patient has a normal mood and affect. behavior is normal. Judgment and thought content normal. Muscular Skeletal: impingement sign right shoulder  Recent Results (from the past 2160 hour(s))  Hepatic function panel     Status: Abnormal   Collection Time: 01/29/16 10:36 AM  Result Value Ref Range   Total Protein 6.6 6.0 - 8.5 g/dL   Albumin 4.0 3.5 - 5.5 g/dL   Bilirubin Total <1.6 0.0 - 1.2 mg/dL   Bilirubin, Direct 1.09 0.00 - 0.40 mg/dL   Alkaline Phosphatase 135 (H) 39 - 117 IU/L   AST 19 0 - 40 IU/L   ALT 23 0 - 32 IU/L  Alkaline phosphatase, isoenzymes     Status: Abnormal   Collection Time: 01/29/16 10:36 AM  Result Value Ref Range   Alkaline Phosphatase 136 (H) 39 - 117 IU/L   LIVER FRACTION 71 18 - 85 %   BONE FRACTION 23 14 - 68 %   INTESTINAL FRAC. 6 0 - 18 %  Specimen status report     Status: None   Collection Time: 01/29/16 10:36 AM  Result Value Ref Range   specimen status report Comment     Comment: Written Authorization Written Authorization Written Authorization Received. Authorization received from [facility] CMA 02-02-2016 Logged by Frances Furbish   POCT HgB A1C     Status: None   Collection Time: 03/04/16  8:53 AM  Result Value Ref Range   Hemoglobin A1C 7.5     Diabetic Foot Exam: Diabetic Foot Exam - Simple   Simple Foot Form Diabetic Foot exam was performed with the following findings:  Yes 03/04/2016  9:40 AM  Visual Inspection No deformities, no ulcerations, no other skin breakdown bilaterally:  Yes Sensation Testing Intact to touch and monofilament testing bilaterally:  Yes Pulse Check Posterior Tibialis and Dorsalis pulse intact bilaterally:  Yes Comments      PHQ2/9: Depression screen [facility] 2/9 03/04/2016 12/01/2015 09/05/2015 06/02/2015 03/03/2015  Decreased Interest 0 0 0 0 0  Down, Depressed, Hopeless 0 0 0 0 0  PHQ - 2 Score 0 0 0 0 0    Fall Risk: Fall Risk  03/04/2016 12/01/2015 09/05/2015 06/02/2015 03/03/2015   Falls in the past year? No No No No No    Functional Status Survey: Is the patient deaf or have difficulty hearing?: No Does the patient have difficulty seeing, even when wearing glasses/contacts?: No Does the patient have difficulty concentrating, remembering, or making decisions?: No Does the patient have difficulty walking or climbing stairs?: No Does the patient have difficulty dressing or bathing?: No Does the patient have difficulty doing errands alone such as visiting a doctor's office or shopping?: No    Assessment & Plan  1. Diabetic polyneuropathy associated with type 2 diabetes mellitus (HCC)  - POCT HgB A1C - Liraglutide (VICTOZA) 18 MG/3ML SOPN; Inject 0.2 mLs (1.2 mg total) into the skin daily.  Dispense: 18 mL; Refill: 1  2. Insomnia, persistent  - temazepam (RESTORIL) 30 MG capsule; Take 1 capsule (30 mg total) by mouth daily.  Dispense: 90 capsule; Refill: 1  3. Benign hypertension  - COMPLETE METABOLIC PANEL WITH GFR  4. Dyslipidemia  - Lipid panel  5. Gastroesophageal reflux disease without esophagitis  Doing well   6. Obstructive apnea  She is wearing CPAP every night, not taking Nuvigil because she has been feeling rested.   7. Menopausal symptom  - sertraline (ZOLOFT) 100 MG tablet; Take 1 tablet (100 mg total) by mouth daily.  Dispense: 90 tablet; Refill: 1  8. Fever blister  - valACYclovir (VALTREX) 1000 MG tablet; Take 1 tablet (1,000 mg total) by mouth 2 (two) times daily. Prn  Dispense: 30 tablet; Refill: 0  9. Fatty liver  Getting labs done every 3 months done by Dr. Lars PinksWhol    10. Vitamin D deficiency  - VITAMIN D 25 Hydroxy (Vit-D Deficiency, Fractures)  11. Right rotator cuff tendinitis  She will have PT - Roseanne RenoStewart

## 2016-03-05 LAB — COMPREHENSIVE METABOLIC PANEL
ALK PHOS: 132 IU/L — AB (ref 39–117)
ALT: 27 IU/L (ref 0–32)
AST: 20 IU/L (ref 0–40)
Albumin/Globulin Ratio: 1.5 (ref 1.2–2.2)
Albumin: 4.1 g/dL (ref 3.5–5.5)
BUN/Creatinine Ratio: 16 (ref 9–23)
BUN: 11 mg/dL (ref 6–24)
CHLORIDE: 102 mmol/L (ref 96–106)
CO2: 26 mmol/L (ref 18–29)
CREATININE: 0.68 mg/dL (ref 0.57–1.00)
Calcium: 9.9 mg/dL (ref 8.7–10.2)
GFR calc Af Amer: 115 mL/min/{1.73_m2} (ref >59)
GFR calc non Af Amer: 99 mL/min/{1.73_m2} (ref >59)
GLUCOSE: 158 mg/dL — AB (ref 65–99)
Globulin, Total: 2.7 g/dL (ref 1.5–4.5)
Potassium: 4.4 mmol/L (ref 3.5–5.2)
Sodium: 142 mmol/L (ref 134–144)
Total Protein: 6.8 g/dL (ref 6.0–8.5)

## 2016-03-05 LAB — LIPID PANEL W/O CHOL/HDL RATIO
CHOLESTEROL TOTAL: 154 mg/dL (ref 100–199)
HDL: 54 mg/dL (ref >39)
LDL CALC: 77 mg/dL (ref 0–99)
TRIGLYCERIDES: 115 mg/dL (ref 0–149)
VLDL CHOLESTEROL CAL: 23 mg/dL (ref 5–40)

## 2016-03-05 LAB — VITAMIN D 25 HYDROXY (VIT D DEFICIENCY, FRACTURES): Vit D, 25-Hydroxy: 33.6 ng/mL (ref 30.0–100.0)

## 2016-03-07 ENCOUNTER — Ambulatory Visit
Admission: RE | Admit: 2016-03-07 | Discharge: 2016-03-07 | Disposition: A | Discharge status: Home / Self Care | Payer: 59 | Source: Ambulatory Visit | Attending: Family Medicine | Admitting: Family Medicine

## 2016-03-07 DIAGNOSIS — L723 Sebaceous cyst: Secondary | ICD-10-CM | POA: Insufficient documentation

## 2016-03-07 DIAGNOSIS — N6489 Other specified disorders of breast: Secondary | ICD-10-CM

## 2016-03-09 ENCOUNTER — Encounter: Payer: Self-pay | Admitting: Family Medicine

## 2016-04-22 ENCOUNTER — Other Ambulatory Visit: Payer: Self-pay | Admitting: Family Medicine

## 2016-04-22 DIAGNOSIS — J302 Other seasonal allergic rhinitis: Secondary | ICD-10-CM

## 2016-04-22 NOTE — Telephone Encounter (Signed)
[facility]

## 2016-04-24 ENCOUNTER — Other Ambulatory Visit: Payer: Self-pay

## 2016-04-24 DIAGNOSIS — E1142 Type 2 diabetes mellitus with diabetic polyneuropathy: Secondary | ICD-10-CM

## 2016-04-24 NOTE — Telephone Encounter (Signed)
Patient requesting refill of Lancet One Touch and Ultra Blue Test Strips.

## 2016-04-25 MED ORDER — ACCU-CHEK SOFT TOUCH LANCETS MISC: 12 refills | Status: DC

## 2016-04-25 MED ORDER — ONETOUCH LANCETS MISC: 1.0000 | Freq: Two times a day (BID) | 1 refills | Status: DC

## 2016-04-25 MED ORDER — GLUCOSE BLOOD VI STRP: 1.0000 | ORAL_STRIP | Freq: Two times a day (BID) | 1 refills | Status: DC

## 2016-04-29 ENCOUNTER — Encounter: Payer: Self-pay | Admitting: Family Medicine

## 2016-04-29 ENCOUNTER — Ambulatory Visit (INDEPENDENT_AMBULATORY_CARE_PROVIDER_SITE_OTHER): Payer: 59 | Admitting: Family Medicine

## 2016-04-29 DIAGNOSIS — Z23 Encounter for immunization: Secondary | ICD-10-CM | POA: Diagnosis not present

## 2016-04-29 DIAGNOSIS — E1142 Type 2 diabetes mellitus with diabetic polyneuropathy: Secondary | ICD-10-CM

## 2016-04-29 MED ORDER — INSULIN GLARGINE 300 UNIT/ML ~~LOC~~ SOPN: 30.0000 [IU] | PEN_INJECTOR | Freq: Every day | SUBCUTANEOUS | 1 refills | Status: DC

## 2016-04-29 NOTE — Progress Notes (Signed)
Name: Joyce Blackburn   MRN: [id number]    DOB: 03/28/1961   Date:04/29/2016       Progress Note  Subjective  Chief Complaint  Chief Complaint  Patient presents with  . BMI screening    pt here to have her e-screening paperwork    HPI  DMII: She is checking fsbs at home and is on average fasting is around 165. She has changed her diet, no longer drinking Pepsi, drinking flavored water instead, also eating smaller portions and added rice cakes for snacks or fruit. She denies polyphagia, polydipsia or polyuria. Started on Tresiba Fall 2016but is now on Toujeo because of insurance change, likely the cause of increased fasting level we will adjust Toujeo dose.. Compliant with oral medication and also Victoza. Taking ARB. She states that neuropathy pain has improved, only has occasional symptoms. hgbA1C has gone down from 8.5% to 7.4% and up to 7.5%. She is up to date with eye exam  Obesity: she has changed her diet since last visit, and has lost 8 lbs in one month. Congratulated her on the achievement. She states if her mother was alive she would be upset about her weight loss. Her mother used to say that when she lost weight it looked like she was on drugs.    Patient Active Problem List   Diagnosis Date Noted  . Fatty liver 03/04/2016  . Vitamin D deficiency 03/04/2016  . Gastroesophageal reflux disease without esophagitis 07/04/2015  . Diabetic polyneuropathy associated with type 2 diabetes mellitus (HCC) 03/03/2015  . Peripheral neuropathy (HCC) 03/03/2015  . Allergic to bees 02/20/2015  . Benign hypertension 02/20/2015  . Insomnia, persistent 02/20/2015  . Chronic idiopathic constipation 02/20/2015  . Dyslipidemia 02/20/2015  . Dermatitis, eczematoid 02/20/2015  . H/O gastric ulcer 02/20/2015  . Herpes 02/20/2015  . Migraine without aura and responsive to treatment 02/20/2015  . Morbid obesity (HCC) 02/20/2015  . Obstructive apnea 02/20/2015  . Paresthesia of arm 02/20/2015  .  Periodic limb movement 02/20/2015  . Allergic rhinitis, seasonal 02/20/2015  . Menopausal symptom 02/20/2015    Past Surgical History:  Procedure Laterality Date  . ABDOMINAL HYSTERECTOMY      Family History  Problem Relation Age of Onset  . Mental illness Mother   . Diabetes Mother   . CVA Mother   . Schizophrenia Mother   . Diabetes Sister   . Hypertension Sister   . Diabetes Brother   . Mental illness Brother   . Bipolar disorder Brother   . Schizophrenia Brother   . Breast cancer Maternal Grandmother     great    Social History   Social History  . Marital status: Married    Spouse name: N/A  . Number of children: N/A  . Years of education: N/A   Occupational History  . Not on file.   Social History Main Topics  . Smoking status: Former Smoker    Packs/day: 0.10    Years: 15.00    Quit date: 07/29/2004  . Smokeless tobacco: Never Used  . Alcohol use No  . Drug use: No  . Sexual activity: Yes   Other Topics Concern  . Not on file   Social History Narrative  . No narrative on file     Current Outpatient Prescriptions:  .  albuterol (PROVENTIL HFA;VENTOLIN HFA) 108 (90 BASE) MCG/ACT inhaler, Inhale 2 puffs into the lungs every 6 (six) hours as needed for wheezing or shortness of breath., Disp: 1 Inhaler, Rfl: 0 .  amLODipine (NORVASC) 2.5 MG tablet, Take 1 tablet by mouth  daily, Disp: 90 tablet, Rfl: 1 .  Armodafinil 150 MG tablet, Take 1 tablet (150 mg total) by mouth daily., Disp: 30 tablet, Rfl: 2 .  aspirin 81 MG tablet, Take 81 mg by mouth daily., Disp: , Rfl:  .  atorvastatin (LIPITOR) 40 MG tablet, Take 1 tablet by mouth  daily, Disp: 90 tablet, Rfl: 1 .  DEXILANT 60 MG capsule, Take 1 capsule by mouth  daily, Disp: 90 capsule, Rfl: 1 .  EPINEPHRINE, ANAPHYLAXIS THERAPY AGENTS,, EPIPEN 2-PAK, 0.3MG /0.3ML (Injection Device)  1 Device use as directed for 30 days  Quantity: 2;  Refills: 1   Ordered :06-Mar-2012  Alba Cory MD;  Started 06-Mar-2012  Active Comments: DX: 989.5, Disp: , Rfl:  .  frovatriptan (FROVA) 2.5 MG tablet, Take 1 tablet by mouth as needed., Disp: , Rfl:  .  glucose blood (ONE TOUCH ULTRA TEST) test strip, 1 each by Other route 2 (two) times daily. Use as instructed, Disp: 100 each, Rfl: 1 .  Insulin Glargine (TOUJEO SOLOSTAR) 300 UNIT/ML SOPN, Inject 15 Units into the skin daily., Disp: 9 pen, Rfl: 1 .  Insulin Pen Needle (NOVOFINE) 32G X 6 MM MISC, 1 each by Does not apply route 2 (two) times daily., Disp: 100 each, Rfl: 1 .  irbesartan-hydrochlorothiazide (AVALIDE) 300-12.5 MG tablet, Take 1 tablet by mouth  daily, Disp: 90 tablet, Rfl: 1 .  Lancets (ACCU-CHEK SOFT TOUCH) lancets, Use as instructed, Disp: 100 each, Rfl: 12 .  Liraglutide (VICTOZA) 18 MG/3ML SOPN, Inject 0.2 mLs (1.2 mg total) into the skin daily., Disp: 18 mL, Rfl: 1 .  loratadine (CLARITIN) 10 MG tablet, Take 1 tablet (10 mg total) by mouth daily., Disp: 30 tablet, Rfl: 11 .  metformin (FORTAMET) 1000 MG (OSM) 24 hr tablet, Take 1 tablet by mouth two  times daily with meals, Disp: 180 tablet, Rfl: 1 .  montelukast (SINGULAIR) 10 MG tablet, TAKE 1 TABLET BY MOUTH AT  BEDTIME, Disp: 90 tablet, Rfl: 1 .  ONE TOUCH LANCETS MISC, 1 each by Does not apply route 2 (two) times daily., Disp: 100 each, Rfl: 1 .  sertraline (ZOLOFT) 100 MG tablet, Take 1 tablet (100 mg total) by mouth daily., Disp: 90 tablet, Rfl: 1 .  temazepam (RESTORIL) 30 MG capsule, Take 1 capsule (30 mg total) by mouth daily., Disp: 90 capsule, Rfl: 1 .  triamcinolone lotion (KENALOG) 0.1 %, TRIAMCINOLONE ACETONIDE, 0.1% (External Lotion)  1 (one) Lotion Lotion apply to rash twice daily for 30 days  Quantity: 30;  Refills: 0   Ordered :24-January-2014  Alba Cory MD;  Mora Appl 24-January-2014 Active, Disp: , Rfl:  .  valACYclovir (VALTREX) 1000 MG tablet, Take 1 tablet (1,000 mg total) by mouth 2 (two) times daily. Prn, Disp: 30 tablet, Rfl: 0  Allergies  Allergen Reactions  . Ace  Inhibitors     cough  . Bee Venom     Bee  . Dapagliflozin     hematuria     ROS  Constitutional: Negative for fever, positive weight change.  Respiratory: Negative for cough and shortness of breath.   Cardiovascular: Negative for chest pain or palpitations.  Gastrointestinal: Negative for abdominal pain, no bowel changes.  Musculoskeletal: Negative for gait problem or joint swelling.  Skin: Negative for rash.  Neurological: Negative for dizziness or headache.  No other specific complaints in a complete review of systems (except as listed in HPI above).  Objective  Vitals:   04/29/16 1016  BP: 118/68  Pulse: 96  Resp: 16  Temp: 98.6 F (37 C)  SpO2: 96%  Weight: 198 lb (89.8 kg)  Height: 5\' 7"  (1.702 m)    Body mass index is 31.01 kg/m.  Physical Exam  Constitutional: Patient appears well-developed and well-nourished. Obese No distress.  HEENT: head atraumatic, normocephalic, pupils equal and reactive to light,  neck supple, throat within normal limits Cardiovascular: Normal rate, regular rhythm and normal heart sounds.  No murmur heard. No BLE edema. Pulmonary/Chest: Effort normal and breath sounds normal. No respiratory distress. Abdominal: Soft.  There is no tenderness. Psychiatric: Patient has a normal mood and affect. behavior is normal. Judgment and thought content normal.  Recent Results (from the past 2160 hour(s))  POCT HgB A1C     Status: None   Collection Time: 03/04/16  8:53 AM  Result Value Ref Range   Hemoglobin A1C 7.5   Comprehensive metabolic panel     Status: Abnormal   Collection Time: 03/04/16  9:59 AM  Result Value Ref Range   Glucose 158 (H) 65 - 99 mg/dL   BUN 11 6 - 24 mg/dL   Creatinine, Ser 1.61 0.57 - 1.00 mg/dL   GFR calc non Af Amer 99 >59 mL/min/1.73   GFR calc Af Amer 115 >59 mL/min/1.73   BUN/Creatinine Ratio 16 9 - 23   Sodium 142 134 - 144 mmol/L   Potassium 4.4 3.5 - 5.2 mmol/L   Chloride 102 96 - 106 mmol/L   CO2 26 18  - 29 mmol/L   Calcium 9.9 8.7 - 10.2 mg/dL   Total Protein 6.8 6.0 - 8.5 g/dL   Albumin 4.1 3.5 - 5.5 g/dL   Globulin, Total 2.7 1.5 - 4.5 g/dL   Albumin/Globulin Ratio 1.5 1.2 - 2.2   Bilirubin Total <0.2 0.0 - 1.2 mg/dL   Alkaline Phosphatase 132 (H) 39 - 117 IU/L   AST 20 0 - 40 IU/L   ALT 27 0 - 32 IU/L  Lipid Panel w/o Chol/HDL Ratio     Status: None   Collection Time: 03/04/16  9:59 AM  Result Value Ref Range   Cholesterol, Total 154 100 - 199 mg/dL   Triglycerides 096 0 - 149 mg/dL   HDL 54 >04 mg/dL   VLDL Cholesterol Cal 23 5 - 40 mg/dL   LDL Calculated 77 0 - 99 mg/dL  VITAMIN D 25 Hydroxy (Vit-D Deficiency, Fractures)     Status: None   Collection Time: 03/04/16  9:59 AM  Result Value Ref Range   Vit D, 25-Hydroxy 33.6 30.0 - 100.0 ng/mL    Comment: Vitamin D deficiency has been defined by the Institute of Medicine and an Endocrine Society practice guideline as a level of serum 25-OH vitamin D less than 20 ng/mL (1,2). The Endocrine Society went on to further define vitamin D insufficiency as a level between 21 and 29 ng/mL (2). 1. IOM (Institute of Medicine). 2010. Dietary reference    intakes for calcium and D. [place]: The    [employer]. 2. Holick MF, Binkley [place], Bischoff-Ferrari HA, et al.    Evaluation, treatment, and prevention of vitamin D    deficiency: an Endocrine Society clinical practice    guideline. JCEM. 2011 Jul; 96(7):1911-30.       PHQ2/9: Depression screen [facility] 2/9 04/29/2016 03/04/2016 12/01/2015 09/05/2015 06/02/2015  Decreased Interest 0 0 0 0 0  Down, Depressed, Hopeless 0 0 0 0 0  PHQ - 2 Score 0 0 0 0 0     Fall Risk: Fall Risk  04/29/2016 03/04/2016 12/01/2015 09/05/2015 06/02/2015  Falls in the past year? No No No No No     Functional Status Survey: Is the patient deaf or have difficulty hearing?: No Does the patient have difficulty seeing, even when wearing glasses/contacts?: No Does the patient have difficulty  concentrating, remembering, or making decisions?: No Does the patient have difficulty walking or climbing stairs?: No Does the patient have difficulty dressing or bathing?: No Does the patient have difficulty doing errands alone such as visiting a doctor's office or shopping?: No    Assessment & Plan  1. Diabetic polyneuropathy associated with type 2 diabetes mellitus (HCC)  - Insulin Glargine (TOUJEO SOLOSTAR) 300 UNIT/ML SOPN; Inject 30-50 Units into the skin daily.  Dispense: 18 pen; Refill: 1  2. Need for influenza vaccination  - Flu Vaccine QUAD 36+ mos PF IM (Fluarix & Fluzone Quad PF)  3. Morbid obesity (HCC)  Discussed with the patient the risk posed by an increased BMI. Discussed importance of portion control, calorie counting and at least 150 minutes of physical activity weekly. Avoid sweet beverages and drink more water. Eat at least 6 servings of fruit and vegetables daily

## 2016-05-08 ENCOUNTER — Other Ambulatory Visit: Payer: Self-pay

## 2016-05-08 ENCOUNTER — Telehealth: Payer: Self-pay

## 2016-05-08 DIAGNOSIS — R748 Abnormal levels of other serum enzymes: Secondary | ICD-10-CM

## 2016-05-08 NOTE — Telephone Encounter (Signed)
-----   Message from Rayann HemanGinger Kaceton Vieau, CMA sent at 02/08/2016  4:30 PM EDT ----- Pt needs repeat LFT's.

## 2016-05-08 NOTE — Telephone Encounter (Signed)
Left vm letting pt know she is due for her 3 months LFT's. Order has been placed.

## 2016-06-10 ENCOUNTER — Encounter: Payer: Self-pay | Admitting: Family Medicine

## 2016-06-10 ENCOUNTER — Ambulatory Visit (INDEPENDENT_AMBULATORY_CARE_PROVIDER_SITE_OTHER): Payer: 59 | Admitting: Family Medicine

## 2016-06-10 VITALS — BP 122/84 | HR 88 | Temp 98.3°F | Resp 16 | Ht 67.0 in | Wt 196.0 lb

## 2016-06-10 DIAGNOSIS — E785 Hyperlipidemia, unspecified: Secondary | ICD-10-CM | POA: Diagnosis not present

## 2016-06-10 DIAGNOSIS — G4700 Insomnia, unspecified: Secondary | ICD-10-CM | POA: Diagnosis not present

## 2016-06-10 DIAGNOSIS — E1140 Type 2 diabetes mellitus with diabetic neuropathy, unspecified: Secondary | ICD-10-CM

## 2016-06-10 DIAGNOSIS — I10 Essential (primary) hypertension: Secondary | ICD-10-CM | POA: Diagnosis not present

## 2016-06-10 DIAGNOSIS — K219 Gastro-esophageal reflux disease without esophagitis: Secondary | ICD-10-CM

## 2016-06-10 DIAGNOSIS — G4733 Obstructive sleep apnea (adult) (pediatric): Secondary | ICD-10-CM | POA: Diagnosis not present

## 2016-06-10 LAB — POCT GLYCOSYLATED HEMOGLOBIN (HGB A1C): Hemoglobin A1C: 6.7

## 2016-06-10 MED ORDER — METFORMIN HCL ER (OSM) 1000 MG PO TB24: ORAL_TABLET | ORAL | 1 refills | Status: DC

## 2016-06-10 MED ORDER — IRBESARTAN-HYDROCHLOROTHIAZIDE 300-12.5 MG PO TABS: 1.0000 | ORAL_TABLET | Freq: Every day | ORAL | 1 refills | Status: DC

## 2016-06-10 MED ORDER — AMLODIPINE BESYLATE 2.5 MG PO TABS: 2.5000 mg | ORAL_TABLET | Freq: Every day | ORAL | 1 refills | Status: DC

## 2016-06-10 MED ORDER — ATORVASTATIN CALCIUM 40 MG PO TABS: 40.0000 mg | ORAL_TABLET | Freq: Every day | ORAL | 1 refills | Status: DC

## 2016-06-10 MED ORDER — DEXLANSOPRAZOLE 60 MG PO CPDR: 1.0000 | DELAYED_RELEASE_CAPSULE | Freq: Every day | ORAL | 1 refills | Status: DC

## 2016-06-10 MED ORDER — LIRAGLUTIDE 18 MG/3ML ~~LOC~~ SOPN: 1.2000 mg | PEN_INJECTOR | Freq: Every day | SUBCUTANEOUS | 1 refills | Status: DC

## 2016-06-10 NOTE — Progress Notes (Signed)
Name: Joyce Blackburn   MRN: [id number]    DOB: 05/14/1961   Date:06/10/2016       Progress Note  Subjective  Chief Complaint  Chief Complaint  Patient presents with  . Diabetes  . Obesity  . Hyperlipidemia    HPI  DMII: She is checking fsbs at home and is on average fasting is around 86.  She has changed her diet, she drinks a little  Pepsi - about 4 ounces a day from 20 ounces a day,  drinking flavored water instead, also eating smaller portions and added rice cakes for snacks or fruit. She denies polyphagia, polydipsia or polyuria. Started on Tresiba Fall 2016but is now on Toujeo because of insurance change, she is alternating 20 units and 23 units of Toujeo , advised to take 22 units daily . Compliant with oral medication and also Victoza. Taking ARB. She states that neuropathy pain has improved, only has occasional symptoms, on both big toes and improves when she removes her shoes.. hgbA1C has gone down from 8.5% to 7.4% than up to 7.5% and now is at goal at 6.7%. She is up to date with eye exam  Obesity: she has changed her diet since last visit, and has lost another 2 lbs since last month. Congratulated her on the achievement. She states if her mother was alive she would be upset about her weight loss. Her mother used to say that when she lost weight it looked like she was on drugs.   Dyslipidemia: taking Lipitor and is tolerating it well. No myalgia or chest pain. She denies dizziness, discussed decreasing dose of medication but she would like to hold off for now.   HTN: well controlled, taking medication, denies chest pain or palpitation.    Vitamin D deficiency: she is taking otc and is feeling better, more energy  Menopause: she states Zoloft was working well initially, but she has noticed that she has been more moody lately and having hot flashes, we adjusted her dose on her last visit and she is doing bette.r   Insomnia: she is taking Temazepam and Zoloft and is doing  well, able to sleep well at night now, she has occasional night sweats.   GERD: she denies heartburn or reflux, taking Dexilant  Fatty liver: seeing GI and needs to return for follow up  OSA: she is complaint with CPAP , every night all night.   Patient Active Problem List   Diagnosis Date Noted  . Fatty liver 03/04/2016  . Vitamin D deficiency 03/04/2016  . Gastroesophageal reflux disease without esophagitis 07/04/2015  . Diabetic polyneuropathy associated with type 2 diabetes mellitus (HCC) 03/03/2015  . Peripheral neuropathy (HCC) 03/03/2015  . Allergic to bees 02/20/2015  . Benign hypertension 02/20/2015  . Insomnia, persistent 02/20/2015  . Chronic idiopathic constipation 02/20/2015  . Dyslipidemia 02/20/2015  . Dermatitis, eczematoid 02/20/2015  . H/O gastric ulcer 02/20/2015  . Herpes 02/20/2015  . Migraine without aura and responsive to treatment 02/20/2015  . Morbid obesity (HCC) 02/20/2015  . Obstructive apnea 02/20/2015  . Paresthesia of arm 02/20/2015  . Periodic limb movement 02/20/2015  . Allergic rhinitis, seasonal 02/20/2015  . Menopausal symptom 02/20/2015    Past Surgical History:  Procedure Laterality Date  . ABDOMINAL HYSTERECTOMY      Family History  Problem Relation Age of Onset  . Mental illness Mother   . Diabetes Mother   . CVA Mother   . Schizophrenia Mother   . Diabetes Sister   .  Hypertension Sister   . Diabetes Brother   . Mental illness Brother   . Bipolar disorder Brother   . Schizophrenia Brother   . Breast cancer Maternal Grandmother     great    Social History   Social History  . Marital status: Married    Spouse name: N/A  . Number of children: N/A  . Years of education: N/A   Occupational History  . Not on file.   Social History Main Topics  . Smoking status: Former Smoker    Packs/day: 0.10    Years: 15.00    Quit date: 07/29/2004  . Smokeless tobacco: Never Used  . Alcohol use No  . Drug use: No  . Sexual  activity: Yes   Other Topics Concern  . Not on file   Social History Narrative  . No narrative on file     Current Outpatient Prescriptions:  .  amLODipine (NORVASC) 2.5 MG tablet, Take 1 tablet (2.5 mg total) by mouth daily., Disp: 90 tablet, Rfl: 1 .  aspirin 81 MG tablet, Take 81 mg by mouth daily., Disp: , Rfl:  .  atorvastatin (LIPITOR) 40 MG tablet, Take 1 tablet (40 mg total) by mouth daily., Disp: 90 tablet, Rfl: 1 .  dexlansoprazole (DEXILANT) 60 MG capsule, Take 1 capsule (60 mg total) by mouth daily., Disp: 90 capsule, Rfl: 1 .  EPINEPHRINE, ANAPHYLAXIS THERAPY AGENTS,, EPIPEN 2-PAK, 0.3MG /0.3ML (Injection Device)  1 Device use as directed for 30 days  Quantity: 2;  Refills: 1   Ordered :06-Mar-2012  Alba Cory MD;  Started 06-Mar-2012 Active Comments: DX: 989.5, Disp: , Rfl:  .  frovatriptan (FROVA) 2.5 MG tablet, Take 1 tablet by mouth as needed., Disp: , Rfl:  .  glucose blood (ONE TOUCH ULTRA TEST) test strip, 1 each by Other route 2 (two) times daily. Use as instructed, Disp: 100 each, Rfl: 1 .  Insulin Glargine (TOUJEO SOLOSTAR) 300 UNIT/ML SOPN, Inject 30-50 Units into the skin daily., Disp: 18 pen, Rfl: 1 .  Insulin Pen Needle (NOVOFINE) 32G X 6 MM MISC, 1 each by Does not apply route 2 (two) times daily., Disp: 100 each, Rfl: 1 .  irbesartan-hydrochlorothiazide (AVALIDE) 300-12.5 MG tablet, Take 1 tablet by mouth daily., Disp: 90 tablet, Rfl: 1 .  Lancets (ACCU-CHEK SOFT TOUCH) lancets, Use as instructed, Disp: 100 each, Rfl: 12 .  liraglutide (VICTOZA) 18 MG/3ML SOPN, Inject 0.2 mLs (1.2 mg total) into the skin daily., Disp: 18 mL, Rfl: 1 .  loratadine (CLARITIN) 10 MG tablet, Take 1 tablet (10 mg total) by mouth daily., Disp: 30 tablet, Rfl: 11 .  metformin (FORTAMET) 1000 MG (OSM) 24 hr tablet, Take 1 tablet by mouth two  times daily with meals, Disp: 180 tablet, Rfl: 1 .  montelukast (SINGULAIR) 10 MG tablet, TAKE 1 TABLET BY MOUTH AT  BEDTIME, Disp: 90 tablet,  Rfl: 1 .  ONE TOUCH LANCETS MISC, 1 each by Does not apply route 2 (two) times daily., Disp: 100 each, Rfl: 1 .  sertraline (ZOLOFT) 100 MG tablet, Take 1 tablet (100 mg total) by mouth daily., Disp: 90 tablet, Rfl: 1 .  temazepam (RESTORIL) 30 MG capsule, Take 1 capsule (30 mg total) by mouth daily., Disp: 90 capsule, Rfl: 1 .  triamcinolone lotion (KENALOG) 0.1 %, TRIAMCINOLONE ACETONIDE, 0.1% (External Lotion)  1 (one) Lotion Lotion apply to rash twice daily for 30 days  Quantity: 30;  Refills: 0   Ordered :24-January-2014  Alba Cory MD;  Started 24-January-2014 Active, Disp: , Rfl:  .  valACYclovir (VALTREX) 1000 MG tablet, Take 1 tablet (1,000 mg total) by mouth 2 (two) times daily. Prn, Disp: 30 tablet, Rfl: 0  Allergies  Allergen Reactions  . Ace Inhibitors     cough  . Bee Venom     Bee  . Dapagliflozin     hematuria     ROS  Constitutional: Negative for fever, positive for weight change.  Respiratory: Negative for cough and shortness of breath.   Cardiovascular: Negative for chest pain or palpitations.  Gastrointestinal: Negative for abdominal pain, no bowel changes.  Musculoskeletal: Negative for gait problem or joint swelling.  Skin: Negative for rash.  Neurological: Negative for dizziness or headache.  No other specific complaints in a complete review of systems (except as listed in HPI above).  Objective  Vitals:   06/10/16 0825  BP: 122/84  Pulse: 88  Resp: 16  Temp: 98.3 F (36.8 C)  SpO2: 96%  Weight: 196 lb (88.9 kg)  Height: 5\' 7"  (1.702 m)    Body mass index is 30.7 kg/m.  Physical Exam  Constitutional: Patient appears well-developed and well-nourished. Obese  No distress.  HEENT: head atraumatic, normocephalic, pupils equal and reactive to light,  neck supple, throat within normal limits Cardiovascular: Normal rate, regular rhythm and normal heart sounds.  No murmur heard. No BLE edema. Pulmonary/Chest: Effort normal and breath sounds normal. No  respiratory distress. Abdominal: Soft.  There is no tenderness. Psychiatric: Patient has a normal mood and affect. behavior is normal. Judgment and thought content normal.  Recent Results (from the past 2160 hour(s))  POCT HgB A1C     Status: None   Collection Time: 06/10/16  8:42 AM  Result Value Ref Range   Hemoglobin A1C 6.7       PHQ2/9: Depression screen [facility] 2/9 04/29/2016 03/04/2016 12/01/2015 09/05/2015 06/02/2015  Decreased Interest 0 0 0 0 0  Down, Depressed, Hopeless 0 0 0 0 0  PHQ - 2 Score 0 0 0 0 0     Fall Risk: Fall Risk  04/29/2016 03/04/2016 12/01/2015 09/05/2015 06/02/2015  Falls in the past year? No No No No No    Assessment & Plan  1. Controlled type 2 diabetes with neuropathy (HCC)  - POCT HgB A1C - metformin (FORTAMET) 1000 MG (OSM) 24 hr tablet; Take 1 tablet by mouth two  times daily with meals  Dispense: 180 tablet; Refill: 1 - liraglutide (VICTOZA) 18 MG/3ML SOPN; Inject 0.2 mLs (1.2 mg total) into the skin daily.  Dispense: 18 mL; Refill: 1  2. Morbid obesity (HCC)  Discussed with the patient the risk posed by an increased BMI. Discussed importance of portion control, calorie counting and at least 150 minutes of physical activity weekly. Avoid sweet beverages and drink more water. Eat at least 6 servings of fruit and vegetables daily  She is doing great  3. Insomnia, persistent  Well controlled at this time  4. Benign hypertension  - amLODipine (NORVASC) 2.5 MG tablet; Take 1 tablet (2.5 mg total) by mouth daily.  Dispense: 90 tablet; Refill: 1 - irbesartan-hydrochlorothiazide (AVALIDE) 300-12.5 MG tablet; Take 1 tablet by mouth daily.  Dispense: 90 tablet; Refill: 1  5. Dyslipidemia  - atorvastatin (LIPITOR) 40 MG tablet; Take 1 tablet (40 mg total) by mouth daily.  Dispense: 90 tablet; Refill: 1  6. Obstructive apnea  Continue CPAP   7. Gastroesophageal reflux disease without esophagitis  - dexlansoprazole (DEXILANT) 60 MG capsule; Take  1 capsule  (60 mg total) by mouth daily.  Dispense: 90 capsule; Refill: 1

## 2016-06-13 ENCOUNTER — Other Ambulatory Visit: Payer: Self-pay | Admitting: Family Medicine

## 2016-06-13 DIAGNOSIS — N951 Menopausal and female climacteric states: Secondary | ICD-10-CM

## 2016-06-14 ENCOUNTER — Other Ambulatory Visit: Payer: Self-pay

## 2016-06-14 NOTE — Telephone Encounter (Signed)
Patient requesting refill of Sertraline to [facility].

## 2016-06-18 ENCOUNTER — Other Ambulatory Visit: Payer: Self-pay

## 2016-06-18 DIAGNOSIS — G4700 Insomnia, unspecified: Secondary | ICD-10-CM

## 2016-06-18 NOTE — Telephone Encounter (Signed)
Got a fax from [facility] requesting a refill of this patient's Restoril Capsules.  Refill request was sent to Dr. Alba CoryKrichna Sowles for approval and submission.

## 2016-06-19 MED ORDER — TEMAZEPAM 30 MG PO CAPS: 30.0000 mg | ORAL_CAPSULE | Freq: Every day | ORAL | 1 refills | Status: DC

## 2016-08-28 ENCOUNTER — Other Ambulatory Visit: Payer: Self-pay | Admitting: Family Medicine

## 2016-08-28 DIAGNOSIS — E1142 Type 2 diabetes mellitus with diabetic polyneuropathy: Secondary | ICD-10-CM

## 2016-09-13 ENCOUNTER — Encounter: Payer: Self-pay | Admitting: Family Medicine

## 2016-09-13 ENCOUNTER — Ambulatory Visit (INDEPENDENT_AMBULATORY_CARE_PROVIDER_SITE_OTHER): Payer: 59 | Admitting: Family Medicine

## 2016-09-13 VITALS — BP 104/62 | HR 95 | Temp 98.3°F | Resp 16 | Ht 67.0 in | Wt 207.1 lb

## 2016-09-13 DIAGNOSIS — I10 Essential (primary) hypertension: Secondary | ICD-10-CM | POA: Diagnosis not present

## 2016-09-13 DIAGNOSIS — J302 Other seasonal allergic rhinitis: Secondary | ICD-10-CM | POA: Diagnosis not present

## 2016-09-13 DIAGNOSIS — E785 Hyperlipidemia, unspecified: Secondary | ICD-10-CM

## 2016-09-13 DIAGNOSIS — E1142 Type 2 diabetes mellitus with diabetic polyneuropathy: Secondary | ICD-10-CM | POA: Diagnosis not present

## 2016-09-13 DIAGNOSIS — G4733 Obstructive sleep apnea (adult) (pediatric): Secondary | ICD-10-CM | POA: Diagnosis not present

## 2016-09-13 DIAGNOSIS — K760 Fatty (change of) liver, not elsewhere classified: Secondary | ICD-10-CM | POA: Diagnosis not present

## 2016-09-13 DIAGNOSIS — G4700 Insomnia, unspecified: Secondary | ICD-10-CM | POA: Diagnosis not present

## 2016-09-13 DIAGNOSIS — N951 Menopausal and female climacteric states: Secondary | ICD-10-CM | POA: Diagnosis not present

## 2016-09-13 LAB — POCT GLYCOSYLATED HEMOGLOBIN (HGB A1C): Hemoglobin A1C: 6.9

## 2016-09-13 IMAGING — NM NM HEPATO W/GB/PHARM/[PERSON_NAME]
2 series · 12 of 12 positions shown · non-contrast
Comparison: Ultrasound 07/07/2015

CLINICAL DATA: Back pain, right side pain, abdominal discomfort.

EXAM:
NUCLEAR MEDICINE HEPATOBILIARY IMAGING WITH GALLBLADDER EF
TECHNIQUE: Sequential images of the abdomen were obtained [DATE] minutes
following intravenous administration of radiopharmaceutical. After
oral ingestion of Ensure, gallbladder ejection fraction was
determined. At 60 min, normal ejection fraction is greater than 33%.
RADIOPHARMACEUTICALS:  5.15 mCi Oc-22m  Choletec IV

[Series 1000: gallbladder ef · 4.80mm/px · 6 of 120 frames shown]
[frame 11/120]
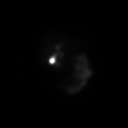
[frame 31/120]
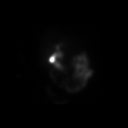
[frame 51/120]
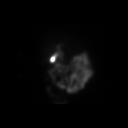
[frame 71/120]
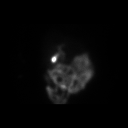
[frame 91/120]
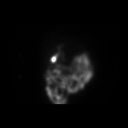
[frame 111/120]
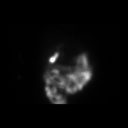

[Series 1000: hepatobiliary scan · 9.59mm/px · 6 of 60 frames shown]
[frame 6/60]
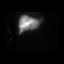
[frame 16/60]
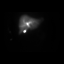
[frame 26/60]
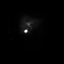
[frame 36/60]
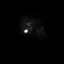
[frame 46/60]
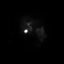
[frame 56/60]
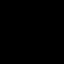

[12 of 12 positions shown; findings below may reference images not displayed]

FINDINGS: Gallbladder ejection fraction: 79%. Normal gallbladder ejection
fraction with Ensure is greater than 33%. Normal uptake and
excretion of radiotracer by the liver. No evidence of biliary
obstruction.
IMPRESSION: Normal study.

## 2016-09-13 MED ORDER — MONTELUKAST SODIUM 10 MG PO TABS: 10.0000 mg | ORAL_TABLET | Freq: Every day | ORAL | 1 refills | Status: DC

## 2016-09-13 NOTE — Progress Notes (Signed)
Name: Joyce Blackburn   MRN: [id number]    DOB: Aug 16, 1960   Date:09/13/2016       Progress Note  Subjective  Chief Complaint  Chief Complaint  Patient presents with  . Diabetes    3 month follow up  . Insomnia  . Hypertension  . Hyperlipidemia  . Obesity    HPI  DMII: She is checking fsbs at home 60-143, usually around 100's.  She has changed her diet, she drinks a little  Pepsi - about 4 ounces a day from 20 ounces a day,but she is eating biscuit every day now. She denies polyphagia, polydipsia or polyuria. Started on Tresiba Fall 2016but is now on Toujeo because of insurance change,  23 units of Toujeo Compliant with oral medication and also Victoza but only on 1.2, we will increase dose to 1.8 to help with her weight loss. Taking ARB. She states that neuropathy pain has improved, only has occasional symptoms, on both big toes and improves when she removes her shoes. hgbA1C has gone down from 8.5% to 7.4% than up to 7.5% , 6.7% and now up to 6.9%. She is up to date with eye exam  Obesity: she had changed her diet and was losing weight, but her daughter and grand-daughter moved to [place] in December 2017 and she has been feeling sad, and has not been compliant with diet.  Dyslipidemia: taking Lipitor and is tolerating it well. No myalgia or chest pain.   HTN: well controlled, taking medication, denies chest pain or palpitation.    Vitamin D deficiency: she is taking otc and is feeling better, more energy  Menopause: she states Zoloft is helping with hot flashes, also helps with her mood. She was feeling sad when her grand-daughter moved to [place] but is doing better again   Insomnia: she is taking Temazepam and Zoloft and is doing well, able to sleep well at night now, she has occasional night sweats.   GERD: she denies heartburn or reflux, taking Dexilant  Fatty liver: seeing GI and needs to return for follow up, she did not re-schedule her visit   OSA: she is  complaint with CPAP , every night all night, denies morning headaches and does not need to nap during the day. .    Patient Active Problem List   Diagnosis Date Noted  . Fatty liver 03/04/2016  . Vitamin D deficiency 03/04/2016  . Gastroesophageal reflux disease without esophagitis 07/04/2015  . Diabetic polyneuropathy associated with type 2 diabetes mellitus (HCC) 03/03/2015  . Peripheral neuropathy (HCC) 03/03/2015  . Allergic to bees 02/20/2015  . Benign hypertension 02/20/2015  . Insomnia, persistent 02/20/2015  . Chronic idiopathic constipation 02/20/2015  . Dyslipidemia 02/20/2015  . Dermatitis, eczematoid 02/20/2015  . H/O gastric ulcer 02/20/2015  . Herpes 02/20/2015  . Migraine without aura and responsive to treatment 02/20/2015  . Morbid obesity (HCC) 02/20/2015  . Obstructive apnea 02/20/2015  . Paresthesia of arm 02/20/2015  . Periodic limb movement 02/20/2015  . Allergic rhinitis, seasonal 02/20/2015  . Menopausal symptom 02/20/2015    Past Surgical History:  Procedure Laterality Date  . ABDOMINAL HYSTERECTOMY      Family History  Problem Relation Age of Onset  . Mental illness Mother   . Diabetes Mother   . CVA Mother   . Schizophrenia Mother   . Diabetes Sister   . Hypertension Sister   . Diabetes Brother   . Mental illness Brother   . Bipolar disorder Brother   .  Schizophrenia Brother   . Breast cancer Maternal Grandmother     great    Social History   Social History  . Marital status: Married    Spouse name: N/A  . Number of children: N/A  . Years of education: N/A   Occupational History  . Not on file.   Social History Main Topics  . Smoking status: Former Smoker    Packs/day: 0.10    Years: 15.00    Quit date: 07/29/2004  . Smokeless tobacco: Never Used  . Alcohol use No  . Drug use: No  . Sexual activity: Yes   Other Topics Concern  . Not on file   Social History Narrative  . No narrative on file     Current Outpatient  Prescriptions:  .  amLODipine (NORVASC) 2.5 MG tablet, Take 1 tablet (2.5 mg total) by mouth daily., Disp: 90 tablet, Rfl: 1 .  aspirin 81 MG tablet, Take 81 mg by mouth daily., Disp: , Rfl:  .  atorvastatin (LIPITOR) 40 MG tablet, Take 1 tablet (40 mg total) by mouth daily., Disp: 90 tablet, Rfl: 1 .  dexlansoprazole (DEXILANT) 60 MG capsule, Take 1 capsule (60 mg total) by mouth daily., Disp: 90 capsule, Rfl: 1 .  EPINEPHRINE, ANAPHYLAXIS THERAPY AGENTS,, EPIPEN 2-PAK, 0.3MG /0.3ML (Injection Device)  1 Device use as directed for 30 days  Quantity: 2;  Refills: 1   Ordered :06-Mar-2012  Alba CorySOWLES, [name] MD;  Started 06-Mar-2012 Active Comments: DX: 989.5, Disp: , Rfl:  .  frovatriptan (FROVA) 2.5 MG tablet, Take 1 tablet by mouth as needed., Disp: , Rfl:  .  Insulin Glargine (TOUJEO SOLOSTAR) 300 UNIT/ML SOPN, Inject 30-50 Units into the skin daily., Disp: 18 pen, Rfl: 1 .  Insulin Pen Needle (NOVOFINE) 32G X 6 MM MISC, 1 each by Does not apply route 2 (two) times daily., Disp: 100 each, Rfl: 1 .  irbesartan-hydrochlorothiazide (AVALIDE) 300-12.5 MG tablet, Take 1 tablet by mouth daily., Disp: 90 tablet, Rfl: 1 .  Lancets (ACCU-CHEK SOFT TOUCH) lancets, Use as instructed, Disp: 100 each, Rfl: 12 .  liraglutide (VICTOZA) 18 MG/3ML SOPN, Inject 0.2 mLs (1.2 mg total) into the skin daily., Disp: 18 mL, Rfl: 1 .  loratadine (CLARITIN) 10 MG tablet, Take 1 tablet (10 mg total) by mouth daily., Disp: 30 tablet, Rfl: 11 .  metformin (FORTAMET) 1000 MG (OSM) 24 hr tablet, Take 1 tablet by mouth two  times daily with meals, Disp: 180 tablet, Rfl: 1 .  montelukast (SINGULAIR) 10 MG tablet, TAKE 1 TABLET BY MOUTH AT  BEDTIME, Disp: 90 tablet, Rfl: 1 .  ONE TOUCH LANCETS MISC, 1 each by Does not apply route 2 (two) times daily., Disp: 100 each, Rfl: 1 .  ONE TOUCH ULTRA TEST test strip, USE AS INSTRUCTED TWO TIMES DAILY, Disp: 200 each, Rfl: 2 .  sertraline (ZOLOFT) 100 MG tablet, TAKE 1 TABLET BY MOUTH  DAILY,  Disp: 90 tablet, Rfl: 1 .  temazepam (RESTORIL) 30 MG capsule, Take 1 capsule (30 mg total) by mouth daily., Disp: 90 capsule, Rfl: 1 .  triamcinolone lotion (KENALOG) 0.1 %, TRIAMCINOLONE ACETONIDE, 0.1% (External Lotion)  1 (one) Lotion Lotion apply to rash twice daily for 30 days  Quantity: 30;  Refills: 0   Ordered :24-January-2014  Alba CorySOWLES, Ireta Pullman MD;  Mora ApplStarted 24-January-2014 Active, Disp: , Rfl:  .  valACYclovir (VALTREX) 1000 MG tablet, Take 1 tablet (1,000 mg total) by mouth 2 (two) times daily. Prn, Disp: 30 tablet, Rfl: 0  Allergies  Allergen Reactions  . Ace Inhibitors     cough  . Bee Venom     Bee  . Dapagliflozin     hematuria     ROS  Constitutional: Negative for fever, positive weight change.  Respiratory: Negative for cough and shortness of breath.   Cardiovascular: Negative for chest pain or palpitations.  Gastrointestinal: Negative for abdominal pain, no bowel changes.  Musculoskeletal: Negative for gait problem or joint swelling.  Skin: Negative for rash.  Neurological: Negative for dizziness or headache.  No other specific complaints in a complete review of systems (except as listed in HPI above).  Objective  Vitals:   09/13/16 0807  BP: 104/62  Pulse: 95  Resp: 16  Temp: 98.3 F (36.8 C)  SpO2: 95%  Weight: 207 lb 2 oz (94 kg)  Height: 5\' 7"  (1.702 m)    Body mass index is 32.44 kg/m.  Physical Exam  Constitutional: Patient appears well-developed and well-nourished. Obese No distress.  HEENT: head atraumatic, normocephalic, pupils equal and reactive to light,  neck supple, throat within normal limits Cardiovascular: Normal rate, regular rhythm and normal heart sounds.  No murmur heard. No BLE edema. Pulmonary/Chest: Effort normal and breath sounds normal. No respiratory distress. Abdominal: Soft.  There is no tenderness. Psychiatric: Patient has a normal mood and affect. behavior is normal. Judgment and thought content normal.  Recent Results (from  the past 2160 hour(s))  POCT HgB A1C     Status: Abnormal   Collection Time: 09/13/16  8:11 AM  Result Value Ref Range   Hemoglobin A1C 6.9       PHQ2/9: Depression screen [facility] 2/9 09/13/2016 04/29/2016 03/04/2016 12/01/2015 09/05/2015  Decreased Interest 0 0 0 0 0  Down, Depressed, Hopeless 1 0 0 0 0  PHQ - 2 Score 1 0 0 0 0     Fall Risk: Fall Risk  09/13/2016 04/29/2016 03/04/2016 12/01/2015 09/05/2015  Falls in the past year? No No No No No     Functional Status Survey: Is the patient deaf or have difficulty hearing?: No Does the patient have difficulty seeing, even when wearing glasses/contacts?: No Does the patient have difficulty concentrating, remembering, or making decisions?: No Does the patient have difficulty walking or climbing stairs?: No Does the patient have difficulty dressing or bathing?: No Does the patient have difficulty doing errands alone such as visiting a doctor's office or shopping?: No    Assessment & Plan  1. Diabetic polyneuropathy associated with type 2 diabetes mellitus (HCC)  - POCT HgB A1C  2. Morbid obesity (HCC)  Discussed with the patient the risk posed by an increased BMI. Discussed importance of portion control, calorie counting and at least 150 minutes of physical activity weekly. Avoid sweet beverages and drink more water. Eat at least 6 servings of fruit and vegetables daily   3. Benign hypertension  Well controlled  4. Dyslipidemia  Continue statin therapy   5. Obstructive apnea  Continue CPAP every day   6. Fatty liver  Needs to follow up with GI  7. Insomnia, persistent  Doing well at this time  8. Menopausal symptom  Doing better  9. Seasonal allergic rhinitis, unspecified chronicity, unspecified trigger  - montelukast (SINGULAIR) 10 MG tablet; Take 1 tablet (10 mg total) by mouth at bedtime.  Dispense: 90 tablet; Refill: 1

## 2016-10-05 ENCOUNTER — Other Ambulatory Visit: Payer: Self-pay | Admitting: Family Medicine

## 2016-10-05 DIAGNOSIS — E1140 Type 2 diabetes mellitus with diabetic neuropathy, unspecified: Secondary | ICD-10-CM

## 2016-10-05 DIAGNOSIS — I10 Essential (primary) hypertension: Secondary | ICD-10-CM

## 2016-10-05 DIAGNOSIS — K219 Gastro-esophageal reflux disease without esophagitis: Secondary | ICD-10-CM

## 2016-10-05 DIAGNOSIS — E785 Hyperlipidemia, unspecified: Secondary | ICD-10-CM

## 2016-12-13 ENCOUNTER — Ambulatory Visit (INDEPENDENT_AMBULATORY_CARE_PROVIDER_SITE_OTHER): Payer: 59 | Admitting: Family Medicine

## 2016-12-13 ENCOUNTER — Encounter: Payer: Self-pay | Admitting: Family Medicine

## 2016-12-13 ENCOUNTER — Other Ambulatory Visit: Payer: Self-pay | Admitting: Family Medicine

## 2016-12-13 VITALS — BP 108/68 | HR 101 | Temp 98.7°F | Resp 16 | Ht 67.0 in | Wt 204.5 lb

## 2016-12-13 DIAGNOSIS — G4700 Insomnia, unspecified: Secondary | ICD-10-CM | POA: Diagnosis not present

## 2016-12-13 DIAGNOSIS — G4733 Obstructive sleep apnea (adult) (pediatric): Secondary | ICD-10-CM

## 2016-12-13 DIAGNOSIS — K219 Gastro-esophageal reflux disease without esophagitis: Secondary | ICD-10-CM

## 2016-12-13 DIAGNOSIS — K760 Fatty (change of) liver, not elsewhere classified: Secondary | ICD-10-CM

## 2016-12-13 DIAGNOSIS — E1142 Type 2 diabetes mellitus with diabetic polyneuropathy: Secondary | ICD-10-CM

## 2016-12-13 DIAGNOSIS — E785 Hyperlipidemia, unspecified: Secondary | ICD-10-CM | POA: Diagnosis not present

## 2016-12-13 DIAGNOSIS — I10 Essential (primary) hypertension: Secondary | ICD-10-CM | POA: Diagnosis not present

## 2016-12-13 DIAGNOSIS — N951 Menopausal and female climacteric states: Secondary | ICD-10-CM

## 2016-12-13 LAB — HM DIABETES EYE EXAM

## 2016-12-13 LAB — POCT GLYCOSYLATED HEMOGLOBIN (HGB A1C): Hemoglobin A1C: 7

## 2016-12-13 MED ORDER — INSULIN PEN NEEDLE 32G X 6 MM MISC: 1.0000 | Freq: Two times a day (BID) | 1 refills | Status: DC

## 2016-12-13 MED ORDER — SERTRALINE HCL 100 MG PO TABS: 100.0000 mg | ORAL_TABLET | Freq: Every day | ORAL | 1 refills | Status: DC

## 2016-12-13 MED ORDER — OLMESARTAN MEDOXOMIL-HCTZ 40-25 MG PO TABS: 1.0000 | ORAL_TABLET | Freq: Every day | ORAL | 0 refills | Status: DC

## 2016-12-13 MED ORDER — TEMAZEPAM 30 MG PO CAPS: 30.0000 mg | ORAL_CAPSULE | Freq: Every day | ORAL | 1 refills | Status: DC

## 2016-12-13 NOTE — Progress Notes (Signed)
Name: Joyce Blackburn   MRN: [id number]    DOB: 1960/08/02   Date:12/13/2016       Progress Note  Subjective  Chief Complaint  Chief Complaint  Patient presents with  . Diabetes    3  month follow up avg 97-140 checks daily  . Hyperlipidemia  . Hypertension  . Obesity  . Insomnia    no issues    HPI  DMII: She is checking fsbs at home has been 140's post-prandially and fasting in the 90's in am's. She has changed her diet, she drinks a little Pepsi - but up from 4 ounces from 20 ounces. She stopped eating biscuits and is now eating grits.  She denies polyphagia, polydipsia or polyuria. Started on Tresiba Fall 2016but is now on Toujeo because of insurance change,  26 units of Toujeo, Metfromin and  Victoza but only on 1.2, advised to try higher dose of Victoza but she could not tolerate it.Taking ARB. She states that neuropathy pain has improved, only has occasional symptoms, on both big toes and improves when she She is due for eye exam, she will go today.   GERD: she also has a history of gastric ulcer about 20 years ago. She states occasionally still has nausea with certain smells, like grease.  She was seen by Dr. Lars Pinks and had multiple labs done for evaluation of fatty liver, HIDA was normal. She is on Dexilant at this time and is doing well, diarrhea is stable as a side effects of medication, but she does not want to change  OSA: she continues to wear CPAP every night, otherwise she wakes up with a headache  Dyslipidemia: taking Lipitor and is tolerating it well. No myalgia or chest pain. She denies dizziness, discussed decreasing dose of medication but she would like to hold off for now.   HTN: well controlled, taking medication, denies chest pain or palpitation. BP is towards low end of normal and she has noticed fluid retention since started to get hotter, we will stop Norvasc and change ARB/HCTZ combo  Vitamin D deficiency: she is taking otc and is feeling better, more  energy  Menopause: she states Zoloft, and states hot flashes is still present but night sweats has improved  Insomnia: she is taking Temazepam and states that she sleeps better when she has snack with medication.   Patient Active Problem List   Diagnosis Date Noted  . Fatty liver 03/04/2016  . Vitamin D deficiency 03/04/2016  . Gastroesophageal reflux disease without esophagitis 07/04/2015  . Diabetic polyneuropathy associated with type 2 diabetes mellitus (HCC) 03/03/2015  . Peripheral neuropathy 03/03/2015  . Allergic to bees 02/20/2015  . Benign hypertension 02/20/2015  . Insomnia, persistent 02/20/2015  . Chronic idiopathic constipation 02/20/2015  . Dyslipidemia 02/20/2015  . Dermatitis, eczematoid 02/20/2015  . H/O gastric ulcer 02/20/2015  . Herpes 02/20/2015  . Migraine without aura and responsive to treatment 02/20/2015  . Morbid obesity (HCC) 02/20/2015  . Obstructive apnea 02/20/2015  . Paresthesia of arm 02/20/2015  . Periodic limb movement 02/20/2015  . Allergic rhinitis, seasonal 02/20/2015  . Menopausal symptom 02/20/2015    Past Surgical History:  Procedure Laterality Date  . ABDOMINAL HYSTERECTOMY      Family History  Problem Relation Age of Onset  . Mental illness Mother   . Diabetes Mother   . CVA Mother   . Schizophrenia Mother   . Diabetes Sister   . Hypertension Sister   . Diabetes Brother   .  Mental illness Brother   . Bipolar disorder Brother   . Schizophrenia Brother   . Breast cancer Maternal Grandmother        great    Social History   Social History  . Marital status: Married    Spouse name: N/A  . Number of children: 3  . Years of education: N/A   Occupational History  . 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 Smoking status: Former Smoker    Packs/day: 0.10    Years: 15.00    Quit date: 07/29/2004  . Smokeless tobacco: Never Used  . Alcohol use No  . Drug use: No  . Sexual  activity: Yes   Other Topics Concern  . Not on file   Social History Narrative   She has 3 daughters, but just has contact with the one in [place] and one in [place].    She has one daughter in [place] but they lost touch     Current Outpatient Prescriptions:  .  aspirin 81 MG tablet, Take 81 mg by mouth daily., Disp: , Rfl:  .  atorvastatin (LIPITOR) 40 MG tablet, TAKE 1 TABLET BY MOUTH  DAILY, Disp: 90 tablet, Rfl: 1 .  DEXILANT 60 MG capsule, TAKE 1 CAPSULE BY MOUTH  DAILY, Disp: 90 capsule, Rfl: 1 .  EPINEPHRINE, ANAPHYLAXIS THERAPY AGENTS,, EPIPEN 2-PAK, 0.3MG /0.3ML (Injection Device)  1 Device use as directed for 30 days  Quantity: 2;  Refills: 1   Ordered :06-Mar-2012  Alba CorySOWLES, Sou Nohr MD;  Started 06-Mar-2012 Active Comments: DX: 989.5, Disp: , Rfl:  .  frovatriptan (FROVA) 2.5 MG tablet, Take 1 tablet by mouth as needed., Disp: , Rfl:  .  Insulin Glargine (TOUJEO SOLOSTAR) 300 UNIT/ML SOPN, Inject 30-50 Units into the skin daily., Disp: 18 pen, Rfl: 1 .  Insulin Pen Needle (NOVOFINE) 32G X 6 MM MISC, 1 each by Does not apply route 2 (two) times daily., Disp: 100 each, Rfl: 1 .  Lancets (ACCU-CHEK SOFT TOUCH) lancets, Use as instructed, Disp: 100 each, Rfl: 12 .  liraglutide (VICTOZA) 18 MG/3ML SOPN, Inject 0.3 mLs (1.8 mg total) into the skin daily., Disp: 18 mL, Rfl: 1 .  loratadine (CLARITIN) 10 MG tablet, Take 1 tablet (10 mg total) by mouth daily., Disp: 30 tablet, Rfl: 11 .  metformin (FORTAMET) 1000 MG (OSM) 24 hr tablet, TAKE 1 TABLET BY MOUTH TWO  TIMES DAILY WITH MEALS, Disp: 180 tablet, Rfl: 1 .  montelukast (SINGULAIR) 10 MG tablet, Take 1 tablet (10 mg total) by mouth at bedtime., Disp: 90 tablet, Rfl: 1 .  olmesartan-hydrochlorothiazide (BENICAR HCT) 40-25 MG tablet, Take 1 tablet by mouth daily., Disp: 90 tablet, Rfl: 0 .  ONE TOUCH LANCETS MISC, 1 each by Does not apply route 2 (two) times daily., Disp: 100 each, Rfl: 1 .  ONE TOUCH ULTRA TEST test strip, USE AS INSTRUCTED  TWO TIMES DAILY, Disp: 200 each, Rfl: 2 .  sertraline (ZOLOFT) 100 MG tablet, Take 1 tablet (100 mg total) by mouth daily., Disp: 90 tablet, Rfl: 1 .  temazepam (RESTORIL) 30 MG capsule, Take 1 capsule (30 mg total) by mouth daily., Disp: 90 capsule, Rfl: 1 .  triamcinolone lotion (KENALOG) 0.1 %, TRIAMCINOLONE ACETONIDE, 0.1% (External Lotion)  1 (one) Lotion Lotion apply to rash twice daily for 30 days  Quantity: 30;  Refills: 0   Ordered :24-January-2014  Alba CorySOWLES, Keylor Rands MD;  Mora ApplStarted 24-January-2014 Active, Disp: ,  Rfl:  .  valACYclovir (VALTREX) 1000 MG tablet, Take 1 tablet (1,000 mg total) by mouth 2 (two) times daily. Prn, Disp: 30 tablet, Rfl: 0  Allergies  Allergen Reactions  . Ace Inhibitors     cough  . Bee Venom     Bee  . Dapagliflozin     hematuria     ROS  Constitutional: Negative for fever or weight change.  Respiratory: Negative for cough and shortness of breath.   Cardiovascular: Negative for chest pain or palpitations.  Gastrointestinal: Negative for abdominal pain, no bowel changes.  Musculoskeletal: Negative for gait problem or joint swelling.  Skin: Negative for rash.  Neurological: Negative for dizziness or headache.  No other specific complaints in a complete review of systems (except as listed in HPI above).  Objective  Vitals:   12/13/16 0909  BP: 108/68  Pulse: (!) 101  Resp: 16  Temp: 98.7 F (37.1 C)  SpO2: 95%  Weight: 204 lb 8 oz (92.8 kg)  Height: 5\' 7"  (1.702 m)    Body mass index is 32.03 kg/m.  Physical Exam  Constitutional: Patient appears well-developed and well-nourished. Obese No distress.  HEENT: head atraumatic, normocephalic, pupils equal and reactive to light,  neck supple, throat within normal limits Cardiovascular: Normal rate, regular rhythm and normal heart sounds.  No murmur heard. Trace BLE edema. Pulmonary/Chest: Effort normal and breath sounds normal. No respiratory distress. Abdominal: Soft.  There is no  tenderness. Psychiatric: Patient has a normal mood and affect. behavior is normal. Judgment and thought content normal.  Recent Results (from the past 2160 hour(s))  POCT HgB A1C     Status: Abnormal   Collection Time: 12/13/16  9:09 AM  Result Value Ref Range   Hemoglobin A1C 7.0      PHQ2/9: Depression screen [facility] 2/9 09/13/2016 04/29/2016 03/04/2016 12/01/2015 09/05/2015  Decreased Interest 0 0 0 0 0  Down, Depressed, Hopeless 1 0 0 0 0  PHQ - 2 Score 1 0 0 0 0     Fall Risk: Fall Risk  09/13/2016 04/29/2016 03/04/2016 12/01/2015 09/05/2015  Falls in the past year? No No No No No     Assessment & Plan  1. Diabetic polyneuropathy associated with type 2 diabetes mellitus (HCC)  - POCT HgB A1C - Insulin Pen Needle (NOVOFINE) 32G X 6 MM MISC; 1 each by Does not apply route 2 (two) times daily.  Dispense: 100 each; Refill: 1  2. Morbid obesity (HCC)  She needs to lose about  8 lbs by next visit so I can fill out the forms for work   3. Benign hypertension  I will stop Norvasc and Avalide and try Benicar HCTZ - since she has noticed mild leg swelling - olmesartan-hydrochlorothiazide (BENICAR HCT) 40-25 MG tablet; Take 1 tablet by mouth daily.  Dispense: 90 tablet; Refill: 0  4. Dyslipidemia  Continue medication   5. Obstructive apnea  She wears CPAP every night, otherwise she wakes up with a headache  6. Fatty liver  Discussed importance of weight loss  7. Insomnia, persistent  - temazepam (RESTORIL) 30 MG capsule; Take 1 capsule (30 mg total) by mouth daily.  Dispense: 90 capsule; Refill: 1  8. Gastroesophageal reflux disease without esophagitis  Doing well   9. Menopausal symptom  - sertraline (ZOLOFT) 100 MG tablet; Take 1 tablet (100 mg total) by mouth daily.  Dispense: 90 tablet; Refill: 1

## 2016-12-16 ENCOUNTER — Encounter: Payer: Self-pay | Admitting: Family Medicine

## 2017-02-07 ENCOUNTER — Other Ambulatory Visit: Payer: Self-pay | Admitting: Family Medicine

## 2017-02-07 DIAGNOSIS — Z1231 Encounter for screening mammogram for malignant neoplasm of breast: Secondary | ICD-10-CM

## 2017-02-20 LAB — HEMOGLOBIN A1C
Hemoglobin A1C: 6.7
Hemoglobin A1C: 6.7

## 2017-02-20 LAB — LIPID PANEL
CHOLESTEROL: 150 (ref 0–200)
HDL: 46 (ref 35–70)
LDL Cholesterol: 79
TRIGLYCERIDES: 124 (ref 40–160)

## 2017-02-20 LAB — BASIC METABOLIC PANEL: GLUCOSE: 120

## 2017-03-21 ENCOUNTER — Encounter: Payer: Self-pay | Admitting: Family Medicine

## 2017-03-21 ENCOUNTER — Ambulatory Visit
Admission: RE | Admit: 2017-03-21 | Discharge: 2017-03-21 | Disposition: A | Discharge status: Home / Self Care | Payer: 59 | Source: Ambulatory Visit | Attending: Family Medicine | Admitting: Family Medicine

## 2017-03-21 ENCOUNTER — Ambulatory Visit (INDEPENDENT_AMBULATORY_CARE_PROVIDER_SITE_OTHER): Payer: 59 | Admitting: Family Medicine

## 2017-03-21 VITALS — BP 102/68 | HR 106 | Temp 98.1°F | Resp 16 | Ht 67.0 in | Wt 203.0 lb

## 2017-03-21 DIAGNOSIS — G4700 Insomnia, unspecified: Secondary | ICD-10-CM | POA: Diagnosis not present

## 2017-03-21 DIAGNOSIS — M25511 Pain in right shoulder: Secondary | ICD-10-CM | POA: Diagnosis not present

## 2017-03-21 DIAGNOSIS — I10 Essential (primary) hypertension: Secondary | ICD-10-CM | POA: Diagnosis not present

## 2017-03-21 DIAGNOSIS — F4321 Adjustment disorder with depressed mood: Secondary | ICD-10-CM

## 2017-03-21 DIAGNOSIS — J302 Other seasonal allergic rhinitis: Secondary | ICD-10-CM

## 2017-03-21 DIAGNOSIS — E1142 Type 2 diabetes mellitus with diabetic polyneuropathy: Secondary | ICD-10-CM | POA: Diagnosis not present

## 2017-03-21 DIAGNOSIS — K219 Gastro-esophageal reflux disease without esophagitis: Secondary | ICD-10-CM

## 2017-03-21 DIAGNOSIS — E785 Hyperlipidemia, unspecified: Secondary | ICD-10-CM | POA: Diagnosis not present

## 2017-03-21 DIAGNOSIS — G4733 Obstructive sleep apnea (adult) (pediatric): Secondary | ICD-10-CM | POA: Diagnosis not present

## 2017-03-21 DIAGNOSIS — Z1231 Encounter for screening mammogram for malignant neoplasm of breast: Secondary | ICD-10-CM | POA: Diagnosis present

## 2017-03-21 DIAGNOSIS — K760 Fatty (change of) liver, not elsewhere classified: Secondary | ICD-10-CM

## 2017-03-21 DIAGNOSIS — F4320 Adjustment disorder, unspecified: Secondary | ICD-10-CM | POA: Diagnosis not present

## 2017-03-21 LAB — POCT UA - MICROALBUMIN: Microalbumin Ur, POC: NEGATIVE mg/L

## 2017-03-21 MED ORDER — OLMESARTAN MEDOXOMIL-HCTZ 20-12.5 MG PO TABS: 1.0000 | ORAL_TABLET | Freq: Every day | ORAL | 0 refills | Status: DC

## 2017-03-21 MED ORDER — MONTELUKAST SODIUM 10 MG PO TABS: 10.0000 mg | ORAL_TABLET | Freq: Every day | ORAL | 1 refills | Status: DC

## 2017-03-21 MED ORDER — MELOXICAM 15 MG PO TABS: 15.0000 mg | ORAL_TABLET | Freq: Every day | ORAL | 0 refills | Status: DC

## 2017-03-21 NOTE — Progress Notes (Signed)
Name: Joyce Blackburn   MRN: [id number]    DOB: Jun 06, 1961   Date:03/21/2017       Progress Note  Subjective  Chief Complaint  Chief Complaint  Patient presents with  . Medication Refill    3 month F/U  . Diabetes    Checks BS twcie daily, Lowest-60 Average-100 Fasting, 120 Non-Fasting, Highest 175  . Hypertension    Denies any symptoms  . Hyperlipidemia  . Sleep Apnea    Doing well with CPAP machine, sleeping on average 8 hour nightly  . Obesity  . Gastroesophageal Reflux    Well controlled with medication    HPI  DMII: She is checking fsbs at home fasting around . She has changed her diet, she is off Pepsi. She stopped eating biscuits and grits, avoid sweets   She denies polyphagia, polydipsia or polyuria. Started on Tresiba Fall 2016but is now on Toujeo because of insurance change, 30 units of Toujeo, Metfromin and  Victoza but only on 1.8. Taking ARB. She states that neuropathy pain has improved, only has occasional symptoms, on both big toes and improves , eye exam is up to date  GERD: she also has a history of gastric ulcer about 20 years ago. She states occasionally still has nausea with certain smells, like grease.  She was seen by Dr. Lars Pinks and had multiple labs done for evaluation of fatty liver, HIDA was normal. She is on Dexilant at this time and is doing well, diarrhea is stable as a side effects of medication, but she does not want to change  OSA: she continues to wear CPAP every night, otherwise she wakes up with a headache  Dyslipidemia: taking Lipitor and is tolerating it well. No myalgia or chest pain.   HTN: well controlled, taking medication, denies chest pain or palpitation. BP is towards low end of normal we stopped Norvasc and we will decrease dose of Benicar today  Vitamin D deficiency: she is taking otc and is feeling better, more energy  Menopause: she states Zoloft, and states hot flashes is still present but night sweats has improved  Insomnia:  she is taking Temazepam and states that she sleeps better when she has snack with medication.  Grieving: husband died 03/24/17. Good family support  Acute shoulder pain: developed aching pain on right shoulder, worse at night and when raising arm. Started about one month ago, not taking any medication for it  Patient Active Problem List   Diagnosis Date Noted  . Fatty liver 03/04/2016  . Vitamin D deficiency 03/04/2016  . Gastroesophageal reflux disease without esophagitis 07/04/2015  . Diabetic polyneuropathy associated with type 2 diabetes mellitus (HCC) 03/03/2015  . Peripheral neuropathy 03/03/2015  . Allergic to bees 02/20/2015  . Benign hypertension 02/20/2015  . Insomnia, persistent 02/20/2015  . Chronic idiopathic constipation 02/20/2015  . Dyslipidemia 02/20/2015  . Dermatitis, eczematoid 02/20/2015  . H/O gastric ulcer 02/20/2015  . Herpes 02/20/2015  . Migraine without aura and responsive to treatment 02/20/2015  . Morbid obesity (HCC) 02/20/2015  . Obstructive apnea 02/20/2015  . Paresthesia of arm 02/20/2015  . Periodic limb movement 02/20/2015  . Allergic rhinitis, seasonal 02/20/2015  . Menopausal symptom 02/20/2015    Past Surgical History:  Procedure Laterality Date  . ABDOMINAL HYSTERECTOMY      Family History  Problem Relation Age of Onset  . Mental illness Mother   . Diabetes Mother   . CVA Mother   . Schizophrenia Mother   . Diabetes Sister   .  Hypertension Sister   . Diabetes Brother   . Mental illness Brother   . Bipolar disorder Brother   . Schizophrenia Brother   . Breast cancer Maternal Grandmother        great    Social History   Social History  . Marital status: Widowed    Spouse name: N/A  . Number of children: 3  . Years of education: N/A   Occupational History  . 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 Smoking status: Former Smoker    Packs/day: 0.10    Years: 15.00    Quit date:  07/29/2004  . Smokeless tobacco: Never Used  . Alcohol use No  . Drug use: No  . Sexual activity: Yes   Other Topics Concern  . Not on file   Social History Narrative   She has 3 daughters, but just has contact with the one in [place] and one in [place].    She has one daughter in [place] but they lost touch     Current Outpatient Prescriptions:  .  amLODipine (NORVASC) 2.5 MG tablet, , Disp: , Rfl:  .  aspirin 81 MG tablet, Take 81 mg by mouth daily., Disp: , Rfl:  .  atorvastatin (LIPITOR) 40 MG tablet, TAKE 1 TABLET BY MOUTH  DAILY, Disp: 90 tablet, Rfl: 1 .  DEXILANT 60 MG capsule, TAKE 1 CAPSULE BY MOUTH  DAILY, Disp: 90 capsule, Rfl: 1 .  EPINEPHRINE, ANAPHYLAXIS THERAPY AGENTS,, EPIPEN 2-PAK, 0.3MG /0.3ML (Injection Device)  1 Device use as directed for 30 days  Quantity: 2;  Refills: 1   Ordered :06-Mar-2012  Alba Cory MD;  Started 06-Mar-2012 Active Comments: DX: 989.5, Disp: , Rfl:  .  frovatriptan (FROVA) 2.5 MG tablet, Take 1 tablet by mouth as needed., Disp: , Rfl:  .  Insulin Pen Needle (NOVOFINE) 32G X 6 MM MISC, 1 each by Does not apply route 2 (two) times daily., Disp: 100 each, Rfl: 1 .  Lancets (ACCU-CHEK SOFT TOUCH) lancets, Use as instructed, Disp: 100 each, Rfl: 12 .  liraglutide (VICTOZA) 18 MG/3ML SOPN, Inject 0.3 mLs (1.8 mg total) into the skin daily., Disp: 18 mL, Rfl: 1 .  loratadine (CLARITIN) 10 MG tablet, Take 1 tablet (10 mg total) by mouth daily., Disp: 30 tablet, Rfl: 11 .  metformin (FORTAMET) 1000 MG (OSM) 24 hr tablet, TAKE 1 TABLET BY MOUTH TWO  TIMES DAILY WITH MEALS, Disp: 180 tablet, Rfl: 1 .  montelukast (SINGULAIR) 10 MG tablet, Take 1 tablet (10 mg total) by mouth at bedtime., Disp: 90 tablet, Rfl: 1 .  ONE TOUCH LANCETS MISC, 1 each by Does not apply route 2 (two) times daily., Disp: 100 each, Rfl: 1 .  ONE TOUCH ULTRA TEST test strip, USE AS INSTRUCTED TWO TIMES DAILY, Disp: 200 each, Rfl: 2 .  sertraline (ZOLOFT) 100 MG tablet, Take 1 tablet  (100 mg total) by mouth daily., Disp: 90 tablet, Rfl: 1 .  temazepam (RESTORIL) 30 MG capsule, Take 1 capsule (30 mg total) by mouth daily., Disp: 90 capsule, Rfl: 1 .  TOUJEO SOLOSTAR 300 UNIT/ML SOPN, INJECT 30-50 UNITS INTO THE SKIN DAILY., Disp: 18 mL, Rfl: 1 .  triamcinolone lotion (KENALOG) 0.1 %, TRIAMCINOLONE ACETONIDE, 0.1% (External Lotion)  1 (one) Lotion Lotion apply to rash twice daily for 30 days  Quantity: 30;  Refills: 0   Ordered :24-January-2014  Alba Cory MD;  Mora Appl 24-January-2014 Active,  Disp: , Rfl:  .  valACYclovir (VALTREX) 1000 MG tablet, Take 1 tablet (1,000 mg total) by mouth 2 (two) times daily. Prn, Disp: 30 tablet, Rfl: 0 .  olmesartan-hydrochlorothiazide (BENICAR HCT) 20-12.5 MG tablet, Take 1 tablet by mouth daily., Disp: 90 tablet, Rfl: 0  Allergies  Allergen Reactions  . Ace Inhibitors     cough  . Bee Venom     Bee  . Dapagliflozin     hematuria     ROS  Constitutional: Negative for fever or weight change.  Respiratory: Negative for cough and shortness of breath.   Cardiovascular: Negative for chest pain or palpitations.  Gastrointestinal: Negative for abdominal pain, no bowel changes.  Musculoskeletal: Negative for gait problem or joint swelling.  Skin: Negative for rash.  Neurological: Negative for dizziness or headache.  No other specific complaints in a complete review of systems (except as listed in HPI above).  Objective  Vitals:   03/21/17 0845  BP: 102/68  Pulse: (!) 106  Resp: 16  Temp: 98.1 F (36.7 C)  TempSrc: Oral  SpO2: 94%  Weight: 203 lb (92.1 kg)  Height: 5\' 7"  (1.702 m)    Body mass index is 31.79 kg/m.  Physical Exam  Constitutional: Patient appears well-developed and well-nourished. Obese  No distress.  HEENT: head atraumatic, normocephalic, pupils equal and reactive to light,  neck supple, throat within normal limits Cardiovascular: Normal rate, regular rhythm and normal heart sounds.  No murmur heard. No BLE  edema. Pulmonary/Chest: Effort normal and breath sounds normal. No respiratory distress. Abdominal: Soft.  There is no tenderness. Psychiatric: Patient has a depressed mood, behavior is normal. Judgment and thought content normal. Muscular Skeletal: pain with abduction and positive empty can sign, pain during palpation of right anterior shoulder, skin has a rash on the right anterior shoulder - advised topical steroids  Recent Results (from the past 2160 hour(s))  Basic metabolic panel     Status: None   Collection Time: 02/20/17 12:00 AM  Result Value Ref Range   Glucose 120   Lipid panel     Status: None   Collection Time: 02/20/17 12:00 AM  Result Value Ref Range   Triglycerides 124 40 - 160   Cholesterol 150 0 - 200   HDL 46 35 - 70   LDL Cholesterol 79   Hemoglobin A1c     Status: None   Collection Time: 02/20/17 12:00 AM  Result Value Ref Range   Hemoglobin A1C 6.7   POCT UA - Microalbumin     Status: Normal   Collection Time: 03/21/17  9:00 AM  Result Value Ref Range   Microalbumin Ur, POC negative mg/L   Creatinine, POC  mg/dL   Albumin/Creatinine Ratio, Urine, POC      Diabetic Foot Exam: Diabetic Foot Exam - Simple   Simple Foot Form Diabetic Foot exam was performed with the following findings:  Yes 03/21/2017  9:48 AM  Visual Inspection See comments:  Yes Sensation Testing Intact to touch and monofilament testing bilaterally:  Yes Pulse Check Posterior Tibialis and Dorsalis pulse intact bilaterally:  Yes Comments Dry skin       PHQ2/9: Depression screen [facility] 2/9 09/13/2016 04/29/2016 03/04/2016 12/01/2015 09/05/2015  Decreased Interest 0 0 0 0 0  Down, Depressed, Hopeless 1 0 0 0 0  PHQ - 2 Score 1 0 0 0 0     Fall Risk: Fall Risk  03/21/2017 09/13/2016 04/29/2016 03/04/2016 12/01/2015  Falls in the past year? No  No No No No     Functional Status Survey: Is the patient deaf or have difficulty hearing?: No Does the patient have difficulty seeing, even when  wearing glasses/contacts?: No Does the patient have difficulty concentrating, remembering, or making decisions?: No Does the patient have difficulty walking or climbing stairs?: No Does the patient have difficulty dressing or bathing?: No Does the patient have difficulty doing errands alone such as visiting a doctor's office or shopping?: No   Assessment & Plan  1. Diabetic polyneuropathy associated with type 2 diabetes mellitus (HCC)  - POCT UA - Microalbumin  2. Morbid obesity (HCC)  Weight has been stable, she has lost 10 lbs over the past 2 years and maintaining , I will write letter of appeal to [facility]   3. Benign hypertension  - olmesartan-hydrochlorothiazide (BENICAR HCT) 20-12.5 MG tablet; Take 1 tablet by mouth daily.  Dispense: 90 tablet; Refill: 0  4. Dyslipidemia  Statin is at goal   5. Obstructive apnea   6. Fatty liver  On life style modification   7. Insomnia, persistent  Doing well on Temazepam  8. Gastroesophageal reflux disease without esophagitis  Doing well at this time  9. Grieving  Discussed hospice care, she has good family support  10. Seasonal allergic rhinitis, unspecified trigger  - montelukast (SINGULAIR) 10 MG tablet; Take 1 tablet (10 mg total) by mouth at bedtime.  Dispense: 90 tablet; Refill: 1   11. Acute pain of right shoulder  Possible bursitis, we will start nsaid's and if no improvement call back for referral to Ortho or steroid injection in our office  - meloxicam (MOBIC) 15 MG tablet; Take 1 tablet (15 mg total) by mouth daily.  Dispense: 30 tablet; Refill: 0

## 2017-03-26 ENCOUNTER — Encounter: Payer: Self-pay | Admitting: Family Medicine

## 2017-05-09 ENCOUNTER — Other Ambulatory Visit: Payer: Self-pay | Admitting: Family Medicine

## 2017-05-09 DIAGNOSIS — I10 Essential (primary) hypertension: Secondary | ICD-10-CM

## 2017-06-18 ENCOUNTER — Encounter: Payer: Self-pay | Admitting: Family Medicine

## 2017-06-18 ENCOUNTER — Ambulatory Visit: Payer: 59 | Admitting: Family Medicine

## 2017-06-18 VITALS — BP 140/90 | HR 85 | Resp 14 | Ht 67.0 in | Wt 202.8 lb

## 2017-06-18 DIAGNOSIS — M545 Low back pain, unspecified: Secondary | ICD-10-CM

## 2017-06-18 DIAGNOSIS — E785 Hyperlipidemia, unspecified: Secondary | ICD-10-CM

## 2017-06-18 DIAGNOSIS — M25511 Pain in right shoulder: Secondary | ICD-10-CM | POA: Diagnosis not present

## 2017-06-18 DIAGNOSIS — E1142 Type 2 diabetes mellitus with diabetic polyneuropathy: Secondary | ICD-10-CM | POA: Diagnosis not present

## 2017-06-18 DIAGNOSIS — K219 Gastro-esophageal reflux disease without esophagitis: Secondary | ICD-10-CM | POA: Diagnosis not present

## 2017-06-18 DIAGNOSIS — G4733 Obstructive sleep apnea (adult) (pediatric): Secondary | ICD-10-CM | POA: Diagnosis not present

## 2017-06-18 DIAGNOSIS — E1140 Type 2 diabetes mellitus with diabetic neuropathy, unspecified: Secondary | ICD-10-CM | POA: Diagnosis not present

## 2017-06-18 DIAGNOSIS — N951 Menopausal and female climacteric states: Secondary | ICD-10-CM

## 2017-06-18 DIAGNOSIS — G4700 Insomnia, unspecified: Secondary | ICD-10-CM

## 2017-06-18 DIAGNOSIS — Z9989 Dependence on other enabling machines and devices: Secondary | ICD-10-CM

## 2017-06-18 DIAGNOSIS — I10 Essential (primary) hypertension: Secondary | ICD-10-CM

## 2017-06-18 LAB — POCT GLYCOSYLATED HEMOGLOBIN (HGB A1C): Hemoglobin A1C: 6.6

## 2017-06-18 MED ORDER — METFORMIN HCL ER (MOD) 1000 MG PO TB24: 1000.0000 mg | ORAL_TABLET | Freq: Every day | ORAL | 1 refills | Status: DC

## 2017-06-18 MED ORDER — ATORVASTATIN CALCIUM 40 MG PO TABS: 40.0000 mg | ORAL_TABLET | Freq: Every day | ORAL | 1 refills | Status: DC

## 2017-06-18 MED ORDER — MELOXICAM 15 MG PO TABS: 15.0000 mg | ORAL_TABLET | Freq: Every day | ORAL | 0 refills | Status: DC

## 2017-06-18 MED ORDER — OLMESARTAN MEDOXOMIL-HCTZ 20-12.5 MG PO TABS: 1.0000 | ORAL_TABLET | Freq: Every day | ORAL | 0 refills | Status: DC

## 2017-06-18 MED ORDER — SERTRALINE HCL 100 MG PO TABS: 100.0000 mg | ORAL_TABLET | Freq: Every day | ORAL | 1 refills | Status: DC

## 2017-06-18 MED ORDER — OLMESARTAN MEDOXOMIL-HCTZ 40-12.5 MG PO TABS: 1.0000 | ORAL_TABLET | Freq: Every day | ORAL | 1 refills | Status: DC

## 2017-06-18 MED ORDER — DEXLANSOPRAZOLE 60 MG PO CPDR: 1.0000 | DELAYED_RELEASE_CAPSULE | Freq: Every day | ORAL | 1 refills | Status: DC

## 2017-06-18 MED ORDER — AMLODIPINE BESYLATE 2.5 MG PO TABS: 2.5000 mg | ORAL_TABLET | Freq: Every day | ORAL | 1 refills | Status: DC

## 2017-06-18 MED ORDER — TEMAZEPAM 30 MG PO CAPS: 30.0000 mg | ORAL_CAPSULE | Freq: Every day | ORAL | 1 refills | Status: DC

## 2017-06-18 MED ORDER — LIRAGLUTIDE 18 MG/3ML ~~LOC~~ SOPN: 1.8000 mg | PEN_INJECTOR | Freq: Every day | SUBCUTANEOUS | 1 refills | Status: DC

## 2017-06-18 NOTE — Progress Notes (Addendum)
Name: Joyce Blackburn   MRN: [id number]    DOB: 03/22/1961   Date:06/18/2017       Progress Note  Subjective  Chief Complaint  Chief Complaint  Patient presents with  . Diabetes  . Hypertension  . Hyperlipidemia    HPI  DMII: She is checking fsbs at home fasting 90-120 . She has changed her diet, she is off Pepsi. She stopped eating biscuits and grits, avoiding  sweets  She denies polyphagia, polydipsia or polyuria. Started on Tresiba Fall 2016but is now on Toujeo because of insurance change, 30units of Toujeo, Metfromin and Victoza but only on 1.8. Taking ARB. She states that neuropathy pain has improved, only has occasional symptoms, on both big toes and improves , eye exam is up to date Urine micro negative August 2018. HgbA1C 6.6%  GERD: she also has a history of gastric ulcer about 20 years ago. She states occasionally still has nausea with certain smells, like grease. She was seen by Dr. Lars PinksWhol and had multiple labs done for evaluation of fatty liver, HIDA was normal. She is on Dexilant at this time and is doing well, diarrhea is stable as a side effects of Metformin,  but she does not want to change  OSA: she continues to wear CPAP every night, otherwise she wakes up with a headache, she needs new supplies  Dyslipidemia: taking Lipitor and is tolerating it well. No myalgia or chest pain.   HTN: usually well controlled, but slightly elevated today, advised to continue to monitor at home,  taking medication as prescribed, denies chest pain or palpitation. She wants to stay off Norvasc and continue Benicar but at higher dose  Vitamin D deficiency: she is taking otc and is feeling better, more energy  Menopause: she states Zoloft, and states hot flashes is still present but night sweats has improved  Insomnia: she is taking Temazepam and states that she sleeps better when she has snack with medication.  Grieving: husband died August 2018. Good family support  Acute  shoulder pain: developed aching pain on right shoulder, worse at night and when raising arm. Started about one month ago, not taking any medication for it  Low back pain: going on for the past 2 months, on sacro-iliac area, aching, sometimes affects her gait because pain radiates to right quad, no rashes. Currently no radiculitis, no bowel or bladder incontinence. Discussed chiropractor care Patient Active Problem List   Diagnosis Date Noted  . Fatty liver 03/04/2016  . Vitamin D deficiency 03/04/2016  . Gastroesophageal reflux disease without esophagitis 07/04/2015  . Diabetic polyneuropathy associated with type 2 diabetes mellitus (HCC) 03/03/2015  . Peripheral neuropathy 03/03/2015  . Allergic to bees 02/20/2015  . Benign hypertension 02/20/2015  . Insomnia, persistent 02/20/2015  . Chronic idiopathic constipation 02/20/2015  . Dyslipidemia 02/20/2015  . Dermatitis, eczematoid 02/20/2015  . H/O gastric ulcer 02/20/2015  . Herpes 02/20/2015  . Migraine without aura and responsive to treatment 02/20/2015  . Morbid obesity (HCC) 02/20/2015  . Obstructive apnea 02/20/2015  . Paresthesia of arm 02/20/2015  . Periodic limb movement 02/20/2015  . Allergic rhinitis, seasonal 02/20/2015  . Menopausal symptom 02/20/2015    Past Surgical History:  Procedure Laterality Date  . ABDOMINAL HYSTERECTOMY      Family History  Problem Relation Age of Onset  . Mental illness Mother   . Diabetes Mother   . CVA Mother   . Schizophrenia Mother   . Diabetes Sister   . Hypertension Sister   .  Diabetes Brother   . Mental illness Brother   . Bipolar disorder Brother   . Schizophrenia Brother   . Breast cancer Maternal Grandmother        great    Social History   Socioeconomic History  . Marital status: Widowed    Spouse name: Not on file  . Number of children: 3  . Years of education: Not on file  . Highest education level: Not on file  Social Needs  . Financial resource strain: Not  on file  . Food insecurity - worry: Not on file  . Food insecurity - inability: Not on file  . Transportation needs - medical: Not on file  . Transportation needs - non-medical: Not on file  Occupational History  . 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 Smoking status: Former Smoker    Packs/day: 0.10    Years: 15.00    Pack years: 1.50    Last attempt to quit: 07/29/2004    Years since quitting: 12.8  . Smokeless tobacco: Never Used  Substance and Sexual Activity  . Alcohol use: No    Alcohol/week: 0.0 oz  . Drug use: No  . Sexual activity: Yes  Other Topics Concern  . Not on file  Social History Narrative   She has 3 daughters, but just has contact with the one in [place] and one in [place].    She has one daughter in [place] but they lost touch     Current Outpatient Medications:  .  amLODipine (NORVASC) 2.5 MG tablet, Take 1 tablet (2.5 mg total) by mouth daily., Disp: 90 tablet, Rfl: 1 .  aspirin 81 MG tablet, Take 81 mg by mouth daily., Disp: , Rfl:  .  atorvastatin (LIPITOR) 40 MG tablet, Take 1 tablet (40 mg total) by mouth daily., Disp: 90 tablet, Rfl: 1 .  dexlansoprazole (DEXILANT) 60 MG capsule, Take 1 capsule (60 mg total) by mouth daily., Disp: 90 capsule, Rfl: 1 .  EPINEPHRINE, ANAPHYLAXIS THERAPY AGENTS,, EPIPEN 2-PAK, 0.3MG /0.3ML (Injection Device)  1 Device use as directed for 30 days  Quantity: 2;  Refills: 1   Ordered :06-Mar-2012  Alba Cory MD;  Started 06-Mar-2012 Active Comments: DX: 989.5, Disp: , Rfl:  .  frovatriptan (FROVA) 2.5 MG tablet, Take 1 tablet by mouth as needed., Disp: , Rfl:  .  Insulin Pen Needle (NOVOFINE) 32G X 6 MM MISC, 1 each by Does not apply route 2 (two) times daily., Disp: 100 each, Rfl: 1 .  Lancets (ACCU-CHEK SOFT TOUCH) lancets, Use as instructed, Disp: 100 each, Rfl: 12 .  liraglutide (VICTOZA) 18 MG/3ML SOPN, Inject 0.3 mLs (1.8 mg total) into the skin daily., Disp: 18 mL,  Rfl: 1 .  loratadine (CLARITIN) 10 MG tablet, Take 1 tablet (10 mg total) by mouth daily., Disp: 30 tablet, Rfl: 11 .  meloxicam (MOBIC) 15 MG tablet, Take 1 tablet (15 mg total) by mouth daily., Disp: 30 tablet, Rfl: 0 .  montelukast (SINGULAIR) 10 MG tablet, Take 1 tablet (10 mg total) by mouth at bedtime., Disp: 90 tablet, Rfl: 1 .  olmesartan-hydrochlorothiazide (BENICAR HCT) 20-12.5 MG tablet, Take 1 tablet by mouth daily., Disp: 90 tablet, Rfl: 0 .  ONE TOUCH LANCETS MISC, 1 each by Does not apply route 2 (two) times daily., Disp: 100 each, Rfl: 1 .  ONE TOUCH ULTRA TEST test strip, USE AS INSTRUCTED TWO TIMES DAILY, Disp:  200 each, Rfl: 2 .  sertraline (ZOLOFT) 100 MG tablet, Take 1 tablet (100 mg total) by mouth daily., Disp: 90 tablet, Rfl: 1 .  temazepam (RESTORIL) 30 MG capsule, Take 1 capsule (30 mg total) by mouth daily., Disp: 90 capsule, Rfl: 1 .  TOUJEO SOLOSTAR 300 UNIT/ML SOPN, INJECT 30-50 UNITS INTO THE SKIN DAILY., Disp: 18 mL, Rfl: 1 .  triamcinolone lotion (KENALOG) 0.1 %, TRIAMCINOLONE ACETONIDE, 0.1% (External Lotion)  1 (one) Lotion Lotion apply to rash twice daily for 30 days  Quantity: 30;  Refills: 0   Ordered :24-January-2014  Alba Cory MD;  Mora Appl 24-January-2014 Active, Disp: , Rfl:  .  valACYclovir (VALTREX) 1000 MG tablet, Take 1 tablet (1,000 mg total) by mouth 2 (two) times daily. Prn, Disp: 30 tablet, Rfl: 0 .  metFORMIN (GLUMETZA) 1000 MG (MOD) 24 hr tablet, Take 1 tablet (1,000 mg total) by mouth daily with breakfast., Disp: 180 tablet, Rfl: 1  Allergies  Allergen Reactions  . Ace Inhibitors     cough  . Bee Venom     Bee  . Dapagliflozin     hematuria     ROS  Constitutional: Negative for fever or weight change.  Respiratory: Negative for cough and shortness of breath.   Cardiovascular: Negative for chest pain or palpitations.  Gastrointestinal: Negative for abdominal pain, no bowel changes.  Musculoskeletal: Positive  for gait problem or joint  swelling.  Skin: Negative for rash.  Neurological: Negative for dizziness or headache.  No other specific complaints in a complete review of systems (except as listed in HPI above).  Objective  Vitals:   06/18/17 0936  BP: 140/90  Pulse: 85  Resp: 14  SpO2: 98%  Weight: 202 lb 12.8 oz (92 kg)  Height: 5\' 7"  (1.702 m)    Body mass index is 31.76 kg/m.  Physical Exam  Constitutional: Patient appears well-developed and well-nourished. Obese No distress.  HEENT: head atraumatic, normocephalic, pupils equal and reactive to light,neck supple, throat within normal limits Cardiovascular: Normal rate, regular rhythm and normal heart sounds.  No murmur heard. No BLE edema. Pulmonary/Chest: Effort normal and breath sounds normal. No respiratory distress. Abdominal: Soft.  There is no tenderness. Muscular Skeletal: pain during palpation of right sacro-iliac joint, negative straight leg raise Psychiatric: Patient has a normal mood and affect. behavior is normal. Judgment and thought content normal.  Recent Results (from the past 2160 hour(s))  POCT UA - Microalbumin     Status: Normal   Collection Time: 03/21/17  9:00 AM  Result Value Ref Range   Microalbumin Ur, POC negative mg/L   Creatinine, POC  mg/dL   Albumin/Creatinine Ratio, Urine, POC    POCT HgB A1C     Status: Abnormal   Collection Time: 06/18/17 10:02 AM  Result Value Ref Range   Hemoglobin A1C 6.6      PHQ2/9: Depression screen [facility] 2/9 09/13/2016 04/29/2016 03/04/2016 12/01/2015 09/05/2015  Decreased Interest 0 0 0 0 0  Down, Depressed, Hopeless 1 0 0 0 0  PHQ - 2 Score 1 0 0 0 0    Fall Risk: Fall Risk  06/18/2017 03/21/2017 09/13/2016 04/29/2016 03/04/2016  Falls in the past year? No No No No No     Functional Status Survey: Is the patient deaf or have difficulty hearing?: No Does the patient have difficulty seeing, even when wearing glasses/contacts?: No Does the patient have difficulty concentrating, remembering, or  making decisions?: No Does the patient have difficulty walking or  climbing stairs?: No Does the patient have difficulty dressing or bathing?: No Does the patient have difficulty doing errands alone such as visiting a doctor's office or shopping?: No   Assessment & Plan  1. Diabetic polyneuropathy associated with type 2 diabetes mellitus (HCC)  - POCT HgB A1C - metFORMIN 1000 MG tablet; Take 1 tablet (1,000 mg total) by mouth daily with breakfast.  Dispense: 90 tablet; Refill: 1  2. Dyslipidemia  - atorvastatin (LIPITOR) 40 MG tablet; Take 1 tablet (40 mg total) by mouth daily.  Dispense: 90 tablet; Refill: 1  3. Gastroesophageal reflux disease without esophagitis  - dexlansoprazole (DEXILANT) 60 MG capsule; Take 1 capsule (60 mg total) by mouth daily.  Dispense: 90 capsule; Refill: 1  4. Controlled type 2 diabetes with neuropathy (HCC)  - liraglutide (VICTOZA) 18 MG/3ML SOPN; Inject 0.3 mLs (1.8 mg total) into the skin daily.  Dispense: 18 mL; Refill: 1  5. Acute pain of right shoulder  improved  6. Benign hypertension  - olmesartan-hydrochlorothiazide (BENICAR HCT) 20-12.5 MG tablet; Take 1 tablet by mouth daily.  Dispense: 90 tablet; Refill: 0  7. Menopausal symptom  - sertraline (ZOLOFT) 100 MG tablet; Take 1 tablet (100 mg total) by mouth daily.  Dispense: 90 tablet; Refill: 1  8. Insomnia, persistent  - temazepam (RESTORIL) 30 MG capsule; Take 1 capsule (30 mg total) by mouth daily.  Dispense: 90 capsule; Refill: 1  9. Acute right-sided low back pain without sciatica  10. OSA on CPAP  - [employer]

## 2017-06-18 NOTE — Addendum Note (Signed)
Addended by: Alba CorySOWLES, Sharae Zappulla F on: 06/18/2017 11:39 AM   Modules accepted: Orders

## 2017-06-18 NOTE — Addendum Note (Signed)
Addended by: Tommie RaymondBOOKER, CRYSTAL L on: 06/18/2017 11:20 AM   Modules accepted: Orders

## 2017-06-27 ENCOUNTER — Encounter: Payer: Self-pay | Admitting: Family Medicine

## 2017-06-27 ENCOUNTER — Other Ambulatory Visit: Payer: Self-pay | Admitting: Family Medicine

## 2017-06-27 MED ORDER — PANTOPRAZOLE SODIUM 40 MG PO TBEC: 40.0000 mg | DELAYED_RELEASE_TABLET | Freq: Every day | ORAL | 1 refills | Status: DC

## 2017-06-27 NOTE — Telephone Encounter (Signed)
Please contact patient and call in Temazepam to local pharmacy if needed,  30 days, she sent a chart message statin that she is unable to get it through mail order - shortage.

## 2017-08-06 ENCOUNTER — Other Ambulatory Visit: Payer: Self-pay | Admitting: Family Medicine

## 2017-08-06 DIAGNOSIS — G4700 Insomnia, unspecified: Secondary | ICD-10-CM

## 2017-08-06 DIAGNOSIS — E1140 Type 2 diabetes mellitus with diabetic neuropathy, unspecified: Secondary | ICD-10-CM

## 2017-08-06 NOTE — Telephone Encounter (Signed)
Request for diabetes medication: Victoza to [facility]  Last office visit pertaining to diabetes:  Lab Results  Component Value Date   HGBA1C 6.6 06/18/2017     Follow up 09/22/2017

## 2017-09-22 ENCOUNTER — Encounter: Payer: Self-pay | Admitting: Family Medicine

## 2017-09-22 ENCOUNTER — Ambulatory Visit: Payer: 59 | Admitting: Family Medicine

## 2017-09-22 VITALS — BP 128/84 | HR 97 | Temp 98.2°F | Resp 16 | Ht 67.0 in | Wt 201.2 lb

## 2017-09-22 DIAGNOSIS — I10 Essential (primary) hypertension: Secondary | ICD-10-CM

## 2017-09-22 DIAGNOSIS — G4700 Insomnia, unspecified: Secondary | ICD-10-CM | POA: Diagnosis not present

## 2017-09-22 DIAGNOSIS — E559 Vitamin D deficiency, unspecified: Secondary | ICD-10-CM | POA: Diagnosis not present

## 2017-09-22 DIAGNOSIS — E1142 Type 2 diabetes mellitus with diabetic polyneuropathy: Secondary | ICD-10-CM

## 2017-09-22 DIAGNOSIS — Z9989 Dependence on other enabling machines and devices: Secondary | ICD-10-CM

## 2017-09-22 DIAGNOSIS — E1140 Type 2 diabetes mellitus with diabetic neuropathy, unspecified: Secondary | ICD-10-CM

## 2017-09-22 DIAGNOSIS — G4733 Obstructive sleep apnea (adult) (pediatric): Secondary | ICD-10-CM

## 2017-09-22 DIAGNOSIS — E785 Hyperlipidemia, unspecified: Secondary | ICD-10-CM | POA: Diagnosis not present

## 2017-09-22 DIAGNOSIS — N951 Menopausal and female climacteric states: Secondary | ICD-10-CM | POA: Diagnosis not present

## 2017-09-22 DIAGNOSIS — J302 Other seasonal allergic rhinitis: Secondary | ICD-10-CM

## 2017-09-22 DIAGNOSIS — K219 Gastro-esophageal reflux disease without esophagitis: Secondary | ICD-10-CM

## 2017-09-22 LAB — POCT GLYCOSYLATED HEMOGLOBIN (HGB A1C): Hemoglobin A1C: 6.6

## 2017-09-22 MED ORDER — MONTELUKAST SODIUM 10 MG PO TABS: 10.0000 mg | ORAL_TABLET | Freq: Every day | ORAL | 1 refills | Status: DC

## 2017-09-22 MED ORDER — ATORVASTATIN CALCIUM 40 MG PO TABS: 40.0000 mg | ORAL_TABLET | Freq: Every day | ORAL | 1 refills | Status: DC

## 2017-09-22 MED ORDER — SERTRALINE HCL 100 MG PO TABS: 100.0000 mg | ORAL_TABLET | Freq: Every day | ORAL | 1 refills | Status: DC

## 2017-09-22 MED ORDER — PANTOPRAZOLE SODIUM 40 MG PO TBEC: 40.0000 mg | DELAYED_RELEASE_TABLET | Freq: Every day | ORAL | 1 refills | Status: DC

## 2017-09-22 MED ORDER — LIRAGLUTIDE 18 MG/3ML ~~LOC~~ SOPN: PEN_INJECTOR | SUBCUTANEOUS | 1 refills | Status: DC

## 2017-09-22 MED ORDER — OLMESARTAN MEDOXOMIL-HCTZ 40-12.5 MG PO TABS: 1.0000 | ORAL_TABLET | Freq: Every day | ORAL | 1 refills | Status: DC

## 2017-09-22 NOTE — Patient Instructions (Signed)

## 2017-09-22 NOTE — Progress Notes (Signed)
Name: Joyce Blackburn   MRN: [id number]    DOB: [birth year]   Date:09/22/2017       Progress Note  Subjective  Chief Complaint  Chief Complaint  Patient presents with  . Medication Refill    3 month F/U  . Hypertension    Denies any symptoms  . Diabetes    Checks BS once daily, Low--110 Highest-150  . Hyperlipidemia  . Sleep Apnea  . Menopause    HPI  DMII: She is checking fsbs at homefasting 110-120. She has changed her diet, she is off Pepsi. She stopped eating biscuitsand grits, avoiding  sweetsShe denies polyphagia, polydipsia or polyuria. Started on Tresiba Fall 2016but is now on Toujeo because of insurance change, 30units of Toujeo, Metfromin and Victoza but only on 1.8.Taking ARB. She states that neuropathy pain has improved, only has occasional symptoms. Eye exam is up to date Urine micro negative Apr 05, 2017. HgbA1C 6.6%, today is the same.   GERD: she also has a history of gastric ulcer about 20 years ago. She states occasionally still has nausea with certain smells, like grease. She was seen by Dr. Lars Pinks and had multiple labs done for evaluation of fatty liver, HIDA was normal. She is on Pantoprazole ( Dexilant no longer covered) at this time and is doing well, diarrhea resolved  OSA: she stopped using CPAP when husband died back 04/05/17, but willing to re-start it now.   Dyslipidemia: taking Lipitor and is tolerating it well. No myalgia or chest pain.   HTN: back to goal, continue Benicar hctz, no side effects no chest pain or palpitation  Vitamin D deficiency: she stopped taking vitamin D otc, advised her to resume it   Menopause: she states Zoloft, and states hot flashes is still present but night sweats has improved  Insomnia: she is taking Temazepam and states that she sleeps better when she has snack with medication.No side effects  Grieving: husband died 2017-04-05. Good family support, Phq9 is 7, but she wants to change medication, today would  have been her husband's birthday and feels better than she did back in 04/05/2017  Low back pain: symptoms started last year, going to chiropractor since 08/2017 and is doing well now, can stant up straight.     Patient Active Problem List   Diagnosis Date Noted  . Fatty liver 03/04/2016  . Vitamin D deficiency 03/04/2016  . Gastroesophageal reflux disease without esophagitis 07/04/2015  . Diabetic polyneuropathy associated with type 2 diabetes mellitus (HCC) 03/03/2015  . Peripheral neuropathy 03/03/2015  . Allergic to bees 02/20/2015  . Benign hypertension 02/20/2015  . Insomnia, persistent 02/20/2015  . Chronic idiopathic constipation 02/20/2015  . Dyslipidemia 02/20/2015  . Dermatitis, eczematoid 02/20/2015  . H/O gastric ulcer 02/20/2015  . Herpes 02/20/2015  . Migraine without aura and responsive to treatment 02/20/2015  . Morbid obesity (HCC) 02/20/2015  . Obstructive apnea 02/20/2015  . Paresthesia of arm 02/20/2015  . Periodic limb movement 02/20/2015  . Allergic rhinitis, seasonal 02/20/2015  . Menopausal symptom 02/20/2015    Past Surgical History:  Procedure Laterality Date  . ABDOMINAL HYSTERECTOMY      Family History  Problem Relation Age of Onset  . Mental illness Mother   . Diabetes Mother   . CVA Mother   . Schizophrenia Mother   . Diabetes Sister   . Hypertension Sister   . Diabetes Brother   . Mental illness Brother   . Bipolar disorder Brother   . Schizophrenia Brother   .  Breast cancer Maternal Grandmother        great    Social History   Socioeconomic History  . Marital status: Widowed    Spouse name: Not on file  . Number of children: 0  . Years of education: Not on file  . Highest education level: Not on file  Social Needs  . Financial resource strain: Not on file  . Food insecurity - worry: Not on file  . Food insecurity - inability: Not on file  . Transportation needs - medical: Not on file  . Transportation needs - non-medical:  Not on file  Occupational History  . Occupation: Radio broadcast assistantteam leader     Employer: [employer]    Comment: customer service   Tobacco Use  . Smoking status: Former Smoker    Packs/day: 0.10    Years: 15.00    Pack years: 1.50    Last attempt to quit: 07/29/2004    Years since quitting: 13.1  . Smokeless tobacco: Never Used  Substance and Sexual Activity  . Alcohol use: No    Alcohol/week: 0.0 oz  . Drug use: No  . Sexual activity: Yes  Other Topics Concern  . Not on file  Social History Narrative   She has 3 step-daughters, but just has contact with the [place] in [place] and the one that was in [place] moved back home to stay with her - but daughter's husband is still in [place] - [place].    She has 3 grand-daughter     Current Outpatient Medications:  .  aspirin 81 MG tablet, Take 81 mg by mouth daily., Disp: , Rfl:  .  atorvastatin (LIPITOR) 40 MG tablet, Take 1 tablet (40 mg total) by mouth daily., Disp: 90 tablet, Rfl: 1 .  EPINEPHRINE, ANAPHYLAXIS THERAPY AGENTS,, EPIPEN 2-PAK, 0.3MG /0.3ML (Injection Device)  1 Device use as directed for 30 days  Quantity: 2;  Refills: 1   Ordered :06-Mar-2012  Alba CorySOWLES, Anabia Weatherwax MD;  Started 06-Mar-2012 Active Comments: DX: 989.5, Disp: , Rfl:  .  frovatriptan (FROVA) 2.5 MG tablet, Take 1 tablet by mouth as needed., Disp: , Rfl:  .  Insulin Pen Needle (NOVOFINE) 32G X 6 MM MISC, 1 each by Does not apply route 2 (two) times daily., Disp: 100 each, Rfl: 1 .  Lancets (ACCU-CHEK SOFT TOUCH) lancets, Use as instructed, Disp: 100 each, Rfl: 12 .  liraglutide (VICTOZA) 18 MG/3ML SOPN, INJECT SUBCUTANEOUSLY 1.8MG  DAILY, Disp: 27 mL, Rfl: 1 .  loratadine (CLARITIN) 10 MG tablet, Take 1 tablet (10 mg total) by mouth daily., Disp: 30 tablet, Rfl: 11 .  meloxicam (MOBIC) 15 MG tablet, Take 1 tablet (15 mg total) by mouth daily., Disp: 30 tablet, Rfl: 0 .  metFORMIN (GLUCOPHAGE) 1000 MG tablet, , Disp: , Rfl:  .  montelukast (SINGULAIR) 10 MG tablet,  Take 1 tablet (10 mg total) by mouth at bedtime., Disp: 90 tablet, Rfl: 1 .  olmesartan-hydrochlorothiazide (BENICAR HCT) 40-12.5 MG tablet, Take 1 tablet by mouth daily., Disp: 90 tablet, Rfl: 1 .  ONE TOUCH LANCETS MISC, 1 each by Does not apply route 2 (two) times daily., Disp: 100 each, Rfl: 1 .  ONE TOUCH ULTRA TEST test strip, USE AS INSTRUCTED TWO TIMES DAILY, Disp: 200 each, Rfl: 2 .  pantoprazole (PROTONIX) 40 MG tablet, Take 1 tablet (40 mg total) by mouth daily. In place of Dexilant, Disp: 90 tablet, Rfl: 1 .  sertraline (ZOLOFT) 100 MG tablet, Take 1 tablet (  100 mg total) by mouth daily., Disp: 90 tablet, Rfl: 1 .  temazepam (RESTORIL) 30 MG capsule, Take 1 capsule (30 mg total) by mouth daily., Disp: 90 capsule, Rfl: 1 .  TOUJEO SOLOSTAR 300 UNIT/ML SOPN, INJECT 30-50 UNITS INTO THE SKIN DAILY., Disp: 18 mL, Rfl: 1 .  triamcinolone lotion (KENALOG) 0.1 %, TRIAMCINOLONE ACETONIDE, 0.1% (External Lotion)  1 (one) Lotion Lotion apply to rash twice daily for 30 days  Quantity: 30;  Refills: 0   Ordered :24-January-2014  Alba Cory MD;  Mora Appl 24-January-2014 Active, Disp: , Rfl:  .  valACYclovir (VALTREX) 1000 MG tablet, Take 1 tablet (1,000 mg total) by mouth 2 (two) times daily. Prn, Disp: 30 tablet, Rfl: 0  Allergies  Allergen Reactions  . Ace Inhibitors     cough  . Bee Venom     Bee  . Dapagliflozin     hematuria     ROS  Constitutional: Negative for fever or weight change.  Respiratory: Negative for cough and shortness of breath.   Cardiovascular: Negative for chest pain or palpitations.  Gastrointestinal: Negative for abdominal pain, no bowel changes.  Musculoskeletal: Negative for gait problem or joint swelling.  Skin: Negative for rash.  Neurological: Negative for dizziness or headache.  No other specific complaints in a complete review of systems (except as listed in HPI above).  Objective  Vitals:   09/22/17 1047  BP: 128/84  Pulse: 97  Resp: 16  Temp: 98.2  F (36.8 C)  TempSrc: Oral  SpO2: 98%  Weight: 201 lb 3.2 oz (91.3 kg)  Height: 5\' 7"  (1.702 m)    Body mass index is 31.51 kg/m.  Physical Exam  Constitutional: Patient appears well-developed and well-nourished. Obese No distress.  HEENT: head atraumatic, normocephalic, pupils equal and reactive to light, neck supple, throat within normal limits Cardiovascular: Normal rate, regular rhythm and normal heart sounds.  No murmur heard. No BLE edema. Pulmonary/Chest: Effort normal and breath sounds normal. No respiratory distress. Abdominal: Soft.  There is no tenderness. Psychiatric: Patient has a normal mood and affect. behavior is normal. Judgment and thought content normal.  Recent Results (from the past 2160 hour(s))  POCT HgB A1C     Status: None   Collection Time: 09/22/17 10:52 AM  Result Value Ref Range   Hemoglobin A1C 6.6      PHQ2/9: Depression screen [facility] 2/9 09/22/2017 09/13/2016 04/29/2016 03/04/2016 12/01/2015  Decreased Interest 1 0 0 0 0  Down, Depressed, Hopeless 1 1 0 0 0  PHQ - 2 Score 2 1 0 0 0  Altered sleeping 3 - - - -  Tired, decreased energy 0 - - - -  Change in appetite 2 - - - -  Feeling bad or failure about yourself  0 - - - -  Trouble concentrating 0 - - - -  Moving slowly or fidgety/restless 0 - - - -  Suicidal thoughts 0 - - - -  PHQ-9 Score 7 - - - -  Difficult doing work/chores Not difficult at all - - - -    Fall Risk: Fall Risk  09/22/2017 06/18/2017 03/21/2017 09/13/2016 04/29/2016  Falls in the past year? No No No No No     Functional Status Survey: Is the patient deaf or have difficulty hearing?: No Does the patient have difficulty seeing, even when wearing glasses/contacts?: No Does the patient have difficulty concentrating, remembering, or making decisions?: No Does the patient have difficulty walking or climbing stairs?: No Does the  patient have difficulty dressing or bathing?: No Does the patient have difficulty doing errands alone  such as visiting a doctor's office or shopping?: No    Assessment & Plan  1. Diabetic polyneuropathy associated with type 2 diabetes mellitus (HCC)  - POCT HgB A1C - liraglutide (VICTOZA) 18 MG/3ML SOPN; INJECT SUBCUTANEOUSLY 1.8MG  DAILY  Dispense: 27 mL; Refill: 1  2. OSA on CPAP  Not waring CPAP since 02/2017  3. Dyslipidemia  - atorvastatin (LIPITOR) 40 MG tablet; Take 1 tablet (40 mg total) by mouth daily.  Dispense: 90 tablet; Refill: 1  4. Gastroesophageal reflux disease without esophagitis  - pantoprazole (PROTONIX) 40 MG tablet; Take 1 tablet (40 mg total) by mouth daily. In place of Dexilant  Dispense: 90 tablet; Refill: 1  5. Morbid obesity (HCC)  Discussed with the patient the risk posed by an increased BMI. Discussed importance of portion control, calorie counting and at least 150 minutes of physical activity weekly. Avoid sweet beverages and drink more water. Eat at least 6 servings of fruit and vegetables daily   6. Vitamin D deficiency  Continue otc supplementation  7. Seasonal allergic rhinitis, unspecified trigger  - montelukast (SINGULAIR) 10 MG tablet; Take 1 tablet (10 mg total) by mouth at bedtime.  Dispense: 90 tablet; Refill: 1  8. Insomnia, persistent  Controlled with Temazepam   9. Menopausal symptom  - sertraline (ZOLOFT) 100 MG tablet; Take 1 tablet (100 mg total) by mouth daily.  Dispense: 90 tablet; Refill: 1  10. Benign hypertension  At goal  - olmesartan-hydrochlorothiazide (BENICAR HCT) 40-12.5 MG tablet; Take 1 tablet by mouth daily.  Dispense: 90 tablet; Refill: 1

## 2017-12-19 ENCOUNTER — Ambulatory Visit: Payer: Managed Care, Other (non HMO) | Admitting: Family Medicine

## 2017-12-19 ENCOUNTER — Encounter: Payer: Self-pay | Admitting: Family Medicine

## 2017-12-19 VITALS — BP 110/70 | HR 84 | Resp 16 | Ht 67.0 in | Wt 213.3 lb

## 2017-12-19 DIAGNOSIS — J302 Other seasonal allergic rhinitis: Secondary | ICD-10-CM | POA: Diagnosis not present

## 2017-12-19 DIAGNOSIS — I10 Essential (primary) hypertension: Secondary | ICD-10-CM

## 2017-12-19 DIAGNOSIS — E1142 Type 2 diabetes mellitus with diabetic polyneuropathy: Secondary | ICD-10-CM

## 2017-12-19 DIAGNOSIS — N951 Menopausal and female climacteric states: Secondary | ICD-10-CM

## 2017-12-19 DIAGNOSIS — G4700 Insomnia, unspecified: Secondary | ICD-10-CM | POA: Diagnosis not present

## 2017-12-19 DIAGNOSIS — M545 Low back pain, unspecified: Secondary | ICD-10-CM

## 2017-12-19 DIAGNOSIS — E559 Vitamin D deficiency, unspecified: Secondary | ICD-10-CM | POA: Diagnosis not present

## 2017-12-19 DIAGNOSIS — B001 Herpesviral vesicular dermatitis: Secondary | ICD-10-CM | POA: Diagnosis not present

## 2017-12-19 DIAGNOSIS — G4733 Obstructive sleep apnea (adult) (pediatric): Secondary | ICD-10-CM

## 2017-12-19 DIAGNOSIS — K219 Gastro-esophageal reflux disease without esophagitis: Secondary | ICD-10-CM | POA: Diagnosis not present

## 2017-12-19 DIAGNOSIS — E785 Hyperlipidemia, unspecified: Secondary | ICD-10-CM | POA: Diagnosis not present

## 2017-12-19 DIAGNOSIS — Z9989 Dependence on other enabling machines and devices: Secondary | ICD-10-CM

## 2017-12-19 DIAGNOSIS — F325 Major depressive disorder, single episode, in full remission: Secondary | ICD-10-CM

## 2017-12-19 LAB — POCT GLYCOSYLATED HEMOGLOBIN (HGB A1C): HEMOGLOBIN A1C: 7 % — AB (ref 4.0–5.6)

## 2017-12-19 MED ORDER — TEMAZEPAM 30 MG PO CAPS: 30.0000 mg | ORAL_CAPSULE | Freq: Every day | ORAL | 1 refills | Status: DC

## 2017-12-19 MED ORDER — OLMESARTAN MEDOXOMIL-HCTZ 40-12.5 MG PO TABS: 1.0000 | ORAL_TABLET | Freq: Every day | ORAL | 1 refills | Status: DC

## 2017-12-19 MED ORDER — ATORVASTATIN CALCIUM 40 MG PO TABS: 40.0000 mg | ORAL_TABLET | Freq: Every day | ORAL | 1 refills | Status: DC

## 2017-12-19 MED ORDER — MONTELUKAST SODIUM 10 MG PO TABS: 10.0000 mg | ORAL_TABLET | Freq: Every day | ORAL | 1 refills | Status: DC

## 2017-12-19 MED ORDER — VALACYCLOVIR HCL 1 G PO TABS: 1000.0000 mg | ORAL_TABLET | Freq: Two times a day (BID) | ORAL | 0 refills | Status: DC

## 2017-12-19 MED ORDER — PANTOPRAZOLE SODIUM 40 MG PO TBEC: 40.0000 mg | DELAYED_RELEASE_TABLET | Freq: Every day | ORAL | 1 refills | Status: DC

## 2017-12-19 MED ORDER — METFORMIN HCL ER 750 MG PO TB24
750.0000 mg | ORAL_TABLET | Freq: Every day | ORAL | 1 refills | Status: DC
Start: 2017-12-19 — End: 2018-03-23

## 2017-12-19 MED ORDER — MELOXICAM 15 MG PO TABS: 15.0000 mg | ORAL_TABLET | Freq: Every day | ORAL | 0 refills | Status: DC

## 2017-12-19 MED ORDER — SEMAGLUTIDE (1 MG/DOSE) 2 MG/1.5ML ~~LOC~~ SOPN: 1.0000 mg | PEN_INJECTOR | SUBCUTANEOUS | 1 refills | Status: DC

## 2017-12-19 MED ORDER — SERTRALINE HCL 100 MG PO TABS: 100.0000 mg | ORAL_TABLET | Freq: Every day | ORAL | 1 refills | Status: DC

## 2017-12-19 NOTE — Addendum Note (Signed)
Addended by: Alba Cory F on: 12/19/2017 01:26 PM   Modules accepted: Orders

## 2017-12-19 NOTE — Progress Notes (Signed)
Name: Joyce Blackburn   MRN: [id number]    DOB: 09/09/1960   Date:12/19/2017       Progress Note  Subjective  Chief Complaint  Chief Complaint  Patient presents with  . Hypertension  . Diabetes  . Hyperlipidemia  . Migraine    HPI  DMII: She has not been  checking fsbs at home lastely She has changed her diet, she is occasionally drinking  Pepsi again , but eating Suzie cakes every day lately, gaining weight.She denies polyphagia, polydipsia or polyuria. Started on Tresiba Fall 2016but is now on Toujeo because of insurance change, 30units of Toujeo, Metfromin and Victoza but only on 1.8.Taking ARB. She states that neuropathy pain has improved, only has occasional symptoms. Eye exam is due Urine micro negative 2017/03/15. Last HgbA1C 6.6%, but up at 7 % today. Needs to resume diet, we will try changing to high dose Ozempic and monitor   GERD: she also has a history of gastric ulcer about 20 years ago. She states occasionally still has nausea with certain smells, like grease. She was seen by Dr. Lars Blackburn and had multiple labs done for evaluation of fatty liver, HIDA was normal. She is on Pantoprazole ( Dexilant no longer covered) at this time and is doing well, diarrhea resolved, discussed risk of bleeding with aspirin  OSA: she stopped using CPAP when husband died back March 15, 2017, but is back on it every  Night.   Dyslipidemia: taking Lipitor and is tolerating it well. No myalgia or chest pain. Recheck labs  WUJ:WJXB to goal, continue Benicar hctz, no side effects no chest pain or palpitation. Unchanged   Vitamin D deficiency: she stopped taking vitamin D otc  Menopause/major depression in remission : she states Zoloft, and states hot flashes is still present but night sweats has improved she is doing better , depression triggered by loss of her husband 01/2017.   Insomnia: she is taking Temazepam and states that she sleeps better when she has snack with medication. Unchanged    Low back pain: symptoms started last year, going to chiropractor since 08/2017 and is doing better now, intermittent symptoms taking Meloxicam prn    Patient Active Problem List   Diagnosis Date Noted  . Major depression in remission (HCC) 12/19/2017  . Fatty liver 03/04/2016  . Vitamin D deficiency 03/04/2016  . Gastroesophageal reflux disease without esophagitis 07/04/2015  . Diabetic polyneuropathy associated with type 2 diabetes mellitus (HCC) 03/03/2015  . Peripheral neuropathy 03/03/2015  . Allergic to bees 02/20/2015  . Benign hypertension 02/20/2015  . Insomnia, persistent 02/20/2015  . Chronic idiopathic constipation 02/20/2015  . Dyslipidemia 02/20/2015  . Dermatitis, eczematoid 02/20/2015  . H/O gastric ulcer 02/20/2015  . Herpes 02/20/2015  . Migraine without aura and responsive to treatment 02/20/2015  . Morbid obesity (HCC) 02/20/2015  . Obstructive apnea 02/20/2015  . Paresthesia of arm 02/20/2015  . Periodic limb movement 02/20/2015  . Allergic rhinitis, seasonal 02/20/2015  . Menopausal symptom 02/20/2015    Past Surgical History:  Procedure Laterality Date  . ABDOMINAL HYSTERECTOMY      Family History  Problem Relation Age of Onset  . Mental illness Mother   . Diabetes Mother   . CVA Mother   . Schizophrenia Mother   . Diabetes Sister   . Hypertension Sister   . Diabetes Brother   . Mental illness Brother   . Bipolar disorder Brother   . Schizophrenia Brother   . Breast cancer Maternal Grandmother  great    Social History   Socioeconomic History  . Marital status: Widowed    Spouse name: Not on file  . Number of children: 0  . Years of education: Not on file  . Highest education level: Not on file  Occupational History  . 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 Financial resource strain: Not on file  . Food insecurity:    Worry: Not on file    Inability: Not on file  .  Transportation needs:    Medical: Not on file    Non-medical: Not on file  Tobacco Use  . Smoking status: Former Smoker    Packs/day: 0.10    Years: 15.00    Pack years: 1.50    Last attempt to quit: 07/29/2004    Years since quitting: 13.4  . Smokeless tobacco: Never Used  Substance and Sexual Activity  . Alcohol use: No    Alcohol/week: 0.0 oz  . Drug use: No  . Sexual activity: Yes  Lifestyle  . Physical activity:    Days per week: Not on file    Minutes per session: Not on file  . Stress: Not on file  Relationships  . Social connections:    Talks on phone: Not on file    Gets together: Not on file    Attends religious service: Not on file    Active member of club or organization: Not on file    Attends meetings of clubs or organizations: Not on file    Relationship status: Not on file  . Intimate partner violence:    Fear of current or ex partner: Not on file    Emotionally abused: Not on file    Physically abused: Not on file    Forced sexual activity: Not on file  Other Topics Concern  . Not on file  Social History Narrative   She has 3 step-daughters, but just has contact with the one in [place] one in [place] and the one that was in [place] moved back home to stay with her - but daughter's husband is still in [place] - lives in [place].    She has 3 grand-daughter     Current Outpatient Medications:  .  aspirin 81 MG tablet, Take 81 mg by mouth daily., Disp: , Rfl:  .  atorvastatin (LIPITOR) 40 MG tablet, Take 1 tablet (40 mg total) by mouth daily., Disp: 90 tablet, Rfl: 1 .  EPINEPHRINE, ANAPHYLAXIS THERAPY AGENTS,, EPIPEN 2-PAK, 0.3MG /0.3ML (Injection Device)  1 Device use as directed for 30 days  Quantity: 2;  Refills: 1   Ordered :06-Mar-2012  Alba Cory MD;  Started 06-Mar-2012 Active Comments: DX: 989.5, Disp: , Rfl:  .  frovatriptan (FROVA) 2.5 MG tablet, Take 1 tablet by mouth as needed., Disp: , Rfl:  .  Insulin Pen Needle (NOVOFINE) 32G X 6 MM MISC, 1  each by Does not apply route 2 (two) times daily., Disp: 100 each, Rfl: 1 .  Lancets (ACCU-CHEK SOFT TOUCH) lancets, Use as instructed, Disp: 100 each, Rfl: 12 .  loratadine (CLARITIN) 10 MG tablet, Take 1 tablet (10 mg total) by mouth daily., Disp: 30 tablet, Rfl: 11 .  montelukast (SINGULAIR) 10 MG tablet, Take 1 tablet (10 mg total) by mouth at bedtime., Disp: 90 tablet, Rfl: 1 .  olmesartan-hydrochlorothiazide (BENICAR HCT) 40-12.5 MG tablet, Take 1 tablet by mouth daily., Disp: 90 tablet, Rfl: 1 .  ONE TOUCH LANCETS MISC, 1 each by Does not apply route 2 (two) times daily., Disp: 100 each, Rfl: 1 .  ONE TOUCH ULTRA TEST test strip, USE AS INSTRUCTED TWO TIMES DAILY, Disp: 200 each, Rfl: 2 .  pantoprazole (PROTONIX) 40 MG tablet, Take 1 tablet (40 mg total) by mouth daily. In place of Dexilant, Disp: 90 tablet, Rfl: 1 .  sertraline (ZOLOFT) 100 MG tablet, Take 1 tablet (100 mg total) by mouth daily., Disp: 90 tablet, Rfl: 1 .  temazepam (RESTORIL) 30 MG capsule, Take 1 capsule (30 mg total) by mouth daily., Disp: 90 capsule, Rfl: 1 .  TOUJEO SOLOSTAR 300 UNIT/ML SOPN, INJECT 30-50 UNITS INTO THE SKIN DAILY., Disp: 18 mL, Rfl: 1 .  valACYclovir (VALTREX) 1000 MG tablet, Take 1 tablet (1,000 mg total) by mouth 2 (two) times daily. Prn, Disp: 30 tablet, Rfl: 0 .  meloxicam (MOBIC) 15 MG tablet, Take 1 tablet (15 mg total) by mouth daily., Disp: 30 tablet, Rfl: 0 .  metFORMIN (GLUCOPHAGE XR) 750 MG 24 hr tablet, Take 1 tablet (750 mg total) by mouth daily with breakfast., Disp: 90 tablet, Rfl: 1 .  Semaglutide (OZEMPIC) 1 MG/DOSE SOPN, Inject 1 mg into the skin once a week., Disp: 9 mL, Rfl: 1  Allergies  Allergen Reactions  . Ace Inhibitors     cough  . Bee Venom     Bee  . Dapagliflozin     hematuria     ROS  Constitutional: Negative for fever or weight change.  Respiratory: Negative for cough and shortness of breath.   Cardiovascular: Negative for chest pain or palpitations.   Gastrointestinal: Negative for abdominal pain, no bowel changes.  Musculoskeletal: Negative for gait problem or joint swelling.  Skin: Negative for rash.  Neurological: Negative for dizziness or headache.  No other specific complaints in a complete review of systems (except as listed in HPI above).  Objective  Vitals:   12/19/17 1020  BP: 110/70  Pulse: 84  Resp: 16  SpO2: 98%  Weight: 213 lb 4.8 oz (96.8 kg)  Height:  (1.702 m)    Body mass index is 33.41 kg/m.  Physical Exam  Constitutional: Patient appears well-developed and well-nourished. Obese No distress.  HEENT: head atraumatic, normocephalic, pupils equal and reactive to light, neck supple, throat within normal limits Cardiovascular: Normal rate, regular rhythm and normal heart sounds.  No murmur heard. No BLE edema. Pulmonary/Chest: Effort normal and breath sounds normal. No respiratory distress. Abdominal: Soft.  There is no tenderness. Psychiatric: Patient has a normal mood and affect. behavior is normal. Judgment and thought content normal.  Recent Results (from the past 2160 hour(s))  POCT HgB A1C     Status: None   Collection Time: 09/22/17 10:52 AM  Result Value Ref Range   Hemoglobin A1C 6.6   POCT HgB A1C     Status: Abnormal   Collection Time: 12/19/17 10:25 AM  Result Value Ref Range   Hemoglobin A1C 7.0 (A) 4.0 - 5.6 %   HbA1c, POC (prediabetic range)  5.7 - 6.4 %   HbA1c, POC (controlled diabetic range)  0.0 - 7.0 %     PHQ2/9: Depression screen [facility] 2/9 12/19/2017 09/22/2017 09/13/2016 04/29/2016 03/04/2016  Decreased Interest 1 1 0 0 0  Down, Depressed, Hopeless 0 0  PHQ - 2 Score 0 0  Altered sleeping 0 3 - - -  Tired, decreased energy 0 0 - - -  Change in appetite 1 2 - - -  Feeling bad or failure about yourself  0 0 - - -  Trouble concentrating 0 0 - - -  Moving slowly or fidgety/restless 0 0 - - -  Suicidal thoughts 0 0 - - -  PHQ-9 Score 3 7 - - -  Difficult doing  work/chores Not difficult at all Not difficult at all - - -     Fall Risk: Fall Risk  12/19/2017 09/22/2017 06/18/2017 03/21/2017 09/13/2016  Falls in the past year? No No No No No     Assessment & Plan  1. Diabetic polyneuropathy associated with type 2 diabetes mellitus (HCC)  - POCT HgB A1C - Semaglutide (OZEMPIC) 1 MG/DOSE SOPN; Inject 1 mg into the skin once a week.  Dispense: 9 mL; Refill: 1 - metFORMIN (GLUCOPHAGE XR) 750 MG 24 hr tablet; Take 1 tablet (750 mg total) by mouth daily with breakfast.  Dispense: 90 tablet; Refill: 1  Going up again, eating suzie Q cakes daily again, and will stop eating it we will also change from Victoza to Ozempic and metformin 1000 daily to ER 750 and recheck in 3 months, due for eye exam  2. Dyslipidemia  - Lipid panel - atorvastatin (LIPITOR) 40 MG tablet; Take 1 tablet (40 mg total) by mouth daily.  Dispense: 90 tablet; Refill: 1  3. Morbid obesity (HCC)  Needs to resume her diet  4. Vitamin D deficiency  - VITAMIN D 25 Hydroxy (Vit-D Deficiency, Fractures)  5. OSA on CPAP  Compliant   6. Benign hypertension  At goal  - Comprehensive metabolic panel - CBC with Differential/Platelet - olmesartan-hydrochlorothiazide (BENICAR HCT) 40-12.5 MG tablet; Take 1 tablet by mouth daily.  Dispense: 90 tablet; Refill: 1  7. Insomnia, persistent  - temazepam (RESTORIL) 30 MG capsule; Take 1 capsule (30 mg total) by mouth daily.  Dispense: 90 capsule; Refill: 1  8. Gastroesophageal reflux disease without esophagitis  - pantoprazole (PROTONIX) 40 MG tablet; Take 1 tablet (40 mg total) by mouth daily. In place of Dexilant  Dispense: 90 tablet; Refill: 1  9. Menopausal symptom  - sertraline (ZOLOFT) 100 MG tablet; Take 1 tablet (100 mg total) by mouth daily.  Dispense: 90 tablet; Refill: 1  10. Fever blister  - valACYclovir (VALTREX) 1000 MG tablet; Take 1 tablet (1,000 mg total) by mouth 2 (two) times daily. Prn  Dispense: 30 tablet;  Refill: 0  11. Seasonal allergic rhinitis, unspecified trigger  - montelukast (SINGULAIR) 10 MG tablet; Take 1 tablet (10 mg total) by mouth at bedtime.  Dispense: 90 tablet; Refill: 1  12. Intermittent low back pain  - meloxicam (MOBIC) 15 MG tablet; Take 1 tablet (15 mg total) by mouth daily.  Dispense: 30 tablet; Refill: 0

## 2017-12-19 NOTE — Addendum Note (Signed)
Addended by: Cynda Familia on: 12/19/2017 01:31 PM   Modules accepted: Orders

## 2017-12-23 ENCOUNTER — Other Ambulatory Visit: Payer: Self-pay | Admitting: Family Medicine

## 2017-12-23 DIAGNOSIS — Z1231 Encounter for screening mammogram for malignant neoplasm of breast: Secondary | ICD-10-CM

## 2018-01-17 ENCOUNTER — Other Ambulatory Visit: Payer: Self-pay | Admitting: Family Medicine

## 2018-01-17 DIAGNOSIS — M545 Low back pain, unspecified: Secondary | ICD-10-CM

## 2018-02-25 LAB — LIPID PANEL
Cholesterol: 140 (ref 0–200)
HDL: 50 (ref 35–70)
LDL CALC: 70
LDL/HDL RATIO: 2.8
Triglycerides: 99 (ref 40–160)

## 2018-02-25 LAB — BASIC METABOLIC PANEL
CREATININE: 0.9 (ref 0.5–1.1)
GLUCOSE: 111

## 2018-02-25 LAB — HEMOGLOBIN A1C: Hemoglobin A1C: 7.2 % — AB (ref 4.0–5.6)

## 2018-03-23 ENCOUNTER — Encounter: Payer: Self-pay | Admitting: Family Medicine

## 2018-03-23 ENCOUNTER — Ambulatory Visit: Payer: 59

## 2018-03-23 ENCOUNTER — Ambulatory Visit: Payer: Managed Care, Other (non HMO) | Admitting: Family Medicine

## 2018-03-23 VITALS — BP 118/68 | HR 90 | Temp 98.7°F | Resp 12 | Ht 67.0 in | Wt 215.8 lb

## 2018-03-23 DIAGNOSIS — E785 Hyperlipidemia, unspecified: Secondary | ICD-10-CM

## 2018-03-23 DIAGNOSIS — Z1231 Encounter for screening mammogram for malignant neoplasm of breast: Secondary | ICD-10-CM

## 2018-03-23 DIAGNOSIS — Z01419 Encounter for gynecological examination (general) (routine) without abnormal findings: Secondary | ICD-10-CM | POA: Diagnosis not present

## 2018-03-23 DIAGNOSIS — G4700 Insomnia, unspecified: Secondary | ICD-10-CM | POA: Diagnosis not present

## 2018-03-23 DIAGNOSIS — E559 Vitamin D deficiency, unspecified: Secondary | ICD-10-CM

## 2018-03-23 DIAGNOSIS — N951 Menopausal and female climacteric states: Secondary | ICD-10-CM

## 2018-03-23 DIAGNOSIS — E1142 Type 2 diabetes mellitus with diabetic polyneuropathy: Secondary | ICD-10-CM

## 2018-03-23 DIAGNOSIS — M2550 Pain in unspecified joint: Secondary | ICD-10-CM

## 2018-03-23 DIAGNOSIS — K219 Gastro-esophageal reflux disease without esophagitis: Secondary | ICD-10-CM

## 2018-03-23 DIAGNOSIS — J302 Other seasonal allergic rhinitis: Secondary | ICD-10-CM

## 2018-03-23 DIAGNOSIS — F325 Major depressive disorder, single episode, in full remission: Secondary | ICD-10-CM

## 2018-03-23 DIAGNOSIS — L308 Other specified dermatitis: Secondary | ICD-10-CM

## 2018-03-23 DIAGNOSIS — Z1239 Encounter for other screening for malignant neoplasm of breast: Secondary | ICD-10-CM

## 2018-03-23 DIAGNOSIS — M25551 Pain in right hip: Secondary | ICD-10-CM

## 2018-03-23 DIAGNOSIS — I10 Essential (primary) hypertension: Secondary | ICD-10-CM

## 2018-03-23 LAB — HM DIABETES EYE EXAM

## 2018-03-23 MED ORDER — TEMAZEPAM 30 MG PO CAPS: 30.0000 mg | ORAL_CAPSULE | Freq: Every day | ORAL | 1 refills | Status: DC

## 2018-03-23 MED ORDER — LORCASERIN HCL ER 20 MG PO TB24: 1.0000 | ORAL_TABLET | Freq: Every day | ORAL | 2 refills | Status: DC

## 2018-03-23 MED ORDER — TRIAMCINOLONE ACETONIDE 0.1 % EX CREA: 1.0000 "application " | TOPICAL_CREAM | Freq: Two times a day (BID) | CUTANEOUS | 0 refills | Status: DC

## 2018-03-23 MED ORDER — METFORMIN HCL ER 750 MG PO TB24: 1500.0000 mg | ORAL_TABLET | Freq: Every day | ORAL | 1 refills | Status: DC

## 2018-03-23 MED ORDER — INSULIN GLARGINE 300 UNIT/ML ~~LOC~~ SOPN: 30.0000 [IU] | PEN_INJECTOR | Freq: Every day | SUBCUTANEOUS | 1 refills | Status: DC

## 2018-03-23 MED ORDER — ATORVASTATIN CALCIUM 40 MG PO TABS: 40.0000 mg | ORAL_TABLET | Freq: Every day | ORAL | 1 refills | Status: DC

## 2018-03-23 MED ORDER — OLMESARTAN MEDOXOMIL-HCTZ 40-12.5 MG PO TABS: 1.0000 | ORAL_TABLET | Freq: Every day | ORAL | 1 refills | Status: DC

## 2018-03-23 MED ORDER — PANTOPRAZOLE SODIUM 40 MG PO TBEC: 40.0000 mg | DELAYED_RELEASE_TABLET | Freq: Every day | ORAL | 1 refills | Status: DC

## 2018-03-23 MED ORDER — SERTRALINE HCL 100 MG PO TABS: 100.0000 mg | ORAL_TABLET | Freq: Every day | ORAL | 1 refills | Status: DC

## 2018-03-23 MED ORDER — SEMAGLUTIDE (1 MG/DOSE) 2 MG/1.5ML ~~LOC~~ SOPN: 1.0000 mg | PEN_INJECTOR | SUBCUTANEOUS | 1 refills | Status: DC

## 2018-03-23 MED ORDER — MONTELUKAST SODIUM 10 MG PO TABS: 10.0000 mg | ORAL_TABLET | Freq: Every day | ORAL | 1 refills | Status: DC

## 2018-03-23 NOTE — Progress Notes (Signed)
Name: Joyce Blackburn   MRN: [id number]    DOB: [birth year]   Date:03/23/2018       Progress Note  Subjective  Chief Complaint  Chief Complaint  Patient presents with  . Annual Exam  . Follow-up  . Diabetes  . Depression  . Gastroesophageal Reflux    HPI   Patient presents for annual CPE and follow up  DMII: She has not been  checking fsbs at home lately has been 120's-130's She denies polyphagia, polydipsia or polyuria. Started on Tresiba Fall 2016but is now on Toujeo because of insurance change, 50units of Toujeo, Metfromin and Ozempic Taking ARB. She states that neuropathy pain has improved, only has occasional symptoms. Eye exam will be done today.  Urine micro today.  Last HgbA1C 6.6%, 7% last visit and now 7.2% during work screen on 02/25/2018  Needs to resume diet, we will increase metformin to 1500 mg daily, continue basal insulin at 50 since fasting levels are normal   GERD: she also has a history of gastric ulcer about 20 years ago. She states occasionally still has nausea with certain smells, like grease. She was seen by Dr. Durwin Reges and had multiple labs done for evaluation of fatty liver, HIDA was normal. She is onPantoprazole ( Dexilant no longer covered)at this time and is doing well,diarrhea resolved, discussed risk of bleeding with aspirin, she is tolerating it well. Following a GERD diet   OSA: wearing CPAP every night, wakes up feeling rested   Dyslipidemia: taking Lipitor and is tolerating it well. No myalgia or chest pain. reviewed labs done at work   MMN:OTRR to goal, continue Benicar hctz, no side effects no chest pain or palpitation. Unchanged   Vitamin D deficiency: advised her to resume vitamin D otc   Menopause/major depression in remission : she states Zoloft, and states hot flashes is still present but night sweats has improved she is doing better , phq 9  is still under control   Insomnia: she is taking Temazepam and she has been sleeping  well on medication No side effects.   Low back pain:symptoms started last year, going to chiropractor since 08/2017 and is doing better now, intermittent symptoms taking Meloxicam prn She has noticed some other joint problems, left shoulder, right hip, sometimes swelling, we will check inflammation markers today   Morbid obesity: based on BMI and risk factors.  She has gained weight since last year. She states stress eating since loss of her husband 56 years ago, willing to resume life style modification and take medication to help her lose weight.   Diet: eating more because of stress, gained weight since last year 57 years old when husband died   Exercise: she has pain, but active with grandchildren   USPSTF grade A and B recommendations    Office Visit from 03/23/2018 in [facility]  AUDIT-C Score  0     Depression:  Depression screen [facility] 2/9 03/23/2018 12/19/2017 09/22/2017 09/13/2016 04/29/2016  Decreased Interest 0 1 1 0 0  Down, Depressed, Hopeless 0 _0 0  PHQ - 2 Score 0 _1 0  Altered sleeping 0 0 3 - -  Tired, decreased energy 1 0 0 - -  Change in appetite _2 - -  Feeling bad or failure about yourself  0 0 0 - -  Trouble concentrating 0 0 0 - -  Moving slowly or fidgety/restless 0 0 0 - -  Suicidal thoughts 0 0 0 - -  PHQ-9 Score _0 - -  Difficult doing work/chores Not difficult at all Not difficult at all Not difficult at all - -   Hypertension: BP Readings from Last 3 Encounters:  03/23/18 118/68  12/19/17 110/70  09/22/17 128/84   Obesity: Wt Readings from Last 3 Encounters:  03/23/18 215 lb 12.8 oz (97.9 kg)  12/19/17 213 lb 4.8 oz (96.8 kg)  09/22/17 201 lb 3.2 oz (91.3 kg)   BMI Readings from Last 3 Encounters:  03/23/18 33.80 kg/m  12/19/17 33.41 kg/m  09/22/17 31.51 kg/m    Hep C Screening: up to date STD testing and prevention (HIV/chl/gon/syphilis): N/A Intimate partner violence: not in a relationship  Sexual History/Pain during  Intercourse: N/A Menstrual History/LMP/Abnormal Bleeding: discussed importance of follow up if it happens  Incontinence Symptoms: mild   Advanced Care Planning: A voluntary discussion about advance care planning including the explanation and discussion of advance directives.  Discussed health care proxy and Living will, and the patient was able to identify a health care proxy as daughter   Patient does not have a living will at present time. If patient does have living will, I have requested they bring this to the clinic to be scanned in to their chart.  Breast cancer:  HM Mammogram  Date Value Ref Range Status  11/18/2012 Normal  Final    BRCA gene screening: N/A Cervical cancer screening: N/A s/p hysterectomy   Osteoporosis Screening:  No results found for: HMDEXASCAN  Lipids:  Lab Results  Component Value Date   CHOL 150 02/20/2017   CHOL 154 03/04/2016   CHOL 140 06/02/2015   Lab Results  Component Value Date   HDL 46 02/20/2017   HDL 54 03/04/2016   HDL 37 (L) 06/02/2015   Lab Results  Component Value Date   LDLCALC 79 02/20/2017   LDLCALC 77 03/04/2016   LDLCALC 81 06/02/2015   Lab Results  Component Value Date   TRIG 124 02/20/2017   TRIG 115 03/04/2016   TRIG 111 06/02/2015   Lab Results  Component Value Date   CHOLHDL 3.8 06/02/2015   No results found for: LDLDIRECT  Glucose:  Glucose  Date Value Ref Range Status  03/04/2016 158 (H) 65 - 99 mg/dL Final  06/02/2015 226 (H) 65 - 99 mg/dL Final    Skin cancer: discussed atypical lesions  Colorectal cancer: up to date Lung cancer:   Low Dose CT Chest recommended if Age 56-80 years, 30 pack-year currently smoking OR have quit w/in 15years. Patient does not qualify.   MKL:KJZPH   Patient Active Problem List   Diagnosis Date Noted  . Major depression in remission ([place]) 12/19/2017  . Fatty liver 03/04/2016  . Vitamin D deficiency 03/04/2016  . Gastroesophageal reflux disease without esophagitis  07/04/2015  . Diabetic polyneuropathy associated with type 2 diabetes mellitus ([place]) 03/03/2015  . Peripheral neuropathy 03/03/2015  . Allergic to bees 02/20/2015  . Benign hypertension 02/20/2015  . Insomnia, persistent 02/20/2015  . Chronic idiopathic constipation 02/20/2015  . Dyslipidemia 02/20/2015  . Dermatitis, eczematoid 02/20/2015  . H/O gastric ulcer 02/20/2015  . Herpes 02/20/2015  . Migraine without aura and responsive to treatment 02/20/2015  . Morbid obesity ([place]) 02/20/2015  . Obstructive apnea 02/20/2015  . Paresthesia of arm 02/20/2015  . Periodic limb movement 02/20/2015  . Allergic rhinitis, seasonal 02/20/2015  . Menopausal symptom 02/20/2015    Past Surgical History:  Procedure Laterality Date  . ABDOMINAL HYSTERECTOMY  Family History  Problem Relation Age of Onset  . Mental illness Mother   . Diabetes Mother   . CVA Mother   . Schizophrenia Mother   . Diabetes Sister   . Hypertension Sister   . Diabetes Brother   . Mental illness Brother   . Bipolar disorder Brother   . Schizophrenia Brother   . Breast cancer Maternal Grandmother        great    Social History   Socioeconomic History  . Marital status: Widowed    Spouse name: Not on file  . Number of children: 0  . Years of education: Not on file  . Highest education level: Not on file  Occupational History  . 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 Financial resource strain: Not hard at all  . Food insecurity:    Worry: Never true    Inability: Never true  . Transportation needs:    Medical: No    Non-medical: No  Tobacco Use  . Smoking status: Former Smoker    Packs/day: 0.10    Years: 15.00    Pack years: 1.50    Last attempt to quit: 07/29/2004    Years since quitting: 13.6  . Smokeless tobacco: Never Used  Substance and Sexual Activity  . Alcohol use: No    Alcohol/week: 0.0 standard drinks  . Drug use: No  .  Sexual activity: Yes  Lifestyle  . Physical activity:    Days per week: 0 days    Minutes per session: 0 min  . Stress: Not at all  Relationships  . Social connections:    Talks on phone: More than three times a week    Gets together: More than three times a week    Attends religious service: Never    Active member of club or organization: No    Attends meetings of clubs or organizations: Never    Relationship status: Widowed  . Intimate partner violence:    Fear of current or ex partner: No    Emotionally abused: No    Physically abused: No    Forced sexual activity: No  Other Topics Concern  . Not on file  Social History Narrative   She has 3 step-daughters, but just has contact with the one in [place] one in [place] and the one that was in [place] moved back home to stay with her - but daughter's husband is still in [place] - lives in [place].    She has 3 grand-daughter and one grandson    One Geographical information systems officer and grandson live with her.      Current Outpatient Medications:  .  aspirin 81 MG tablet, Take 81 mg by mouth daily., Disp: , Rfl:  .  atorvastatin (LIPITOR) 40 MG tablet, Take 1 tablet (40 mg total) by mouth daily., Disp: 90 tablet, Rfl: 1 .  EPINEPHRINE, ANAPHYLAXIS THERAPY AGENTS,, EPIPEN 2-PAK, 0.3MG/0.3ML (Injection Device)  1 Device use as directed for 30 days  Quantity: 2;  Refills: 1   Ordered :06-Mar-2012  Steele Sizer MD;  Marisa Sprinkles Active Comments: DX: 989.5, Disp: , Rfl:  .  frovatriptan (FROVA) 2.5 MG tablet, Take 1 tablet by mouth as needed., Disp: , Rfl:  .  Insulin Glargine (TOUJEO SOLOSTAR) 300 UNIT/ML SOPN, Inject 30-50 Units into the skin daily., Disp: 18 mL, Rfl: 1 .  loratadine (CLARITIN) 10 MG tablet, Take 1 tablet (10 mg  total) by mouth daily., Disp: 30 tablet, Rfl: 11 .  meloxicam (MOBIC) 15 MG tablet, TAKE 1 TABLET BY MOUTH EVERY DAY (Patient taking differently: Take 15 mg by mouth as needed. ), Disp: 30 tablet, Rfl: 0 .  metFORMIN  (GLUCOPHAGE XR) 750 MG 24 hr tablet, Take 2 tablets (1,500 mg total) by mouth daily with breakfast., Disp: 180 tablet, Rfl: 1 .  montelukast (SINGULAIR) 10 MG tablet, Take 1 tablet (10 mg total) by mouth at bedtime., Disp: 90 tablet, Rfl: 1 .  olmesartan-hydrochlorothiazide (BENICAR HCT) 40-12.5 MG tablet, Take 1 tablet by mouth daily., Disp: 90 tablet, Rfl: 1 .  pantoprazole (PROTONIX) 40 MG tablet, Take 1 tablet (40 mg total) by mouth daily. In place of Dexilant, Disp: 90 tablet, Rfl: 1 .  Semaglutide (OZEMPIC) 1 MG/DOSE SOPN, Inject 1 mg into the skin once a week., Disp: 9 mL, Rfl: 1 .  temazepam (RESTORIL) 30 MG capsule, Take 1 capsule (30 mg total) by mouth daily., Disp: 90 capsule, Rfl: 1 .  valACYclovir (VALTREX) 1000 MG tablet, Take 1 tablet (1,000 mg total) by mouth 2 (two) times daily. Prn, Disp: 30 tablet, Rfl: 0 .  Insulin Pen Needle (NOVOFINE) 32G X 6 MM MISC, 1 each by Does not apply route 2 (two) times daily., Disp: 100 each, Rfl: 1 .  Lancets (ACCU-CHEK SOFT TOUCH) lancets, Use as instructed, Disp: 100 each, Rfl: 12 .  Lorcaserin HCl ER (BELVIQ XR) 20 MG TB24, Take 1 tablet by mouth daily., Disp: 30 tablet, Rfl: 2 .  ONE TOUCH LANCETS MISC, 1 each by Does not apply route 2 (two) times daily., Disp: 100 each, Rfl: 1 .  ONE TOUCH ULTRA TEST test strip, USE AS INSTRUCTED TWO TIMES DAILY, Disp: 200 each, Rfl: 2 .  sertraline (ZOLOFT) 100 MG tablet, Take 1 tablet (100 mg total) by mouth daily., Disp: 90 tablet, Rfl: 1 .  triamcinolone cream (KENALOG) 0.1 %, Apply 1 application topically 2 (two) times daily., Disp: 80 g, Rfl: 0  Allergies  Allergen Reactions  . Ace Inhibitors     cough  . Bee Venom     Bee  . Dapagliflozin     hematuria     ROS  Constitutional: Negative for fever or weight change.  Respiratory: Negative for cough and shortness of breath.   Cardiovascular: Negative for chest pain or palpitations.  Gastrointestinal: Negative for abdominal pain, no bowel  changes.  Musculoskeletal: Negative for gait problem or joint swelling.  Skin: Negative for rash.  Neurological: Negative for dizziness or headache.  No other specific complaints in a complete review of systems (except as listed in HPI above).  Objective  Vitals:   03/23/18 0827  BP: 118/68  Pulse: 90  Resp: 12  Temp: 98.7 F (37.1 C)  TempSrc: Oral  SpO2: 98%  Weight: 215 lb 12.8 oz (97.9 kg)  Height: 5' 7" (1.702 m)    Body mass index is 33.8 kg/m.  Physical Exam  Constitutional: Patient appears well-developed and obese. No distress.  HENT: Head: Normocephalic and atraumatic. Ears: B TMs ok, no erythema or effusion; Nose: Nose normal. Mouth/Throat: Oropharynx is clear and moist. No oropharyngeal exudate.  Eyes: Conjunctivae and EOM are normal. Pupils are equal, round, and reactive to light. No scleral icterus.  Neck: Normal range of motion. Neck supple. No JVD present. No thyromegaly present.  Cardiovascular: Normal rate, regular rhythm and normal heart sounds.  No murmur heard. No BLE edema. Pulmonary/Chest: Effort normal and breath sounds normal.  No respiratory distress. Abdominal: Soft. Bowel sounds are normal, no distension. There is no tenderness. no masses Breast: she refused breast exam, states she will have mammogram.  FEMALE GENITALIA:  Not done  RECTAL: not done  Musculoskeletal: Normal range of motion, no joint effusions. No gross deformities. Pain during abduction of left shoulder, no synovitis, pain with rom of spine, negative straight leg raise  Neurological: he is alert and oriented to person, place, and time. No cranial nerve deficit. Coordination, balance, strength, speech and gait are normal.  Skin: Skin is warm and dry. Eczematous patch on dorsal aspect of right hand . No erythema.  Psychiatric: Patient has a normal mood and affect. behavior is normal. Judgment and thought content normal.  Diabetic Foot Exam: Diabetic Foot Exam - Simple   Simple Foot  Form Diabetic Foot exam was performed with the following findings:  Yes 03/23/2018  9:36 AM  Visual Inspection See comments:  Yes Sensation Testing Intact to touch and monofilament testing bilaterally:  Yes Pulse Check Posterior Tibialis and Dorsalis pulse intact bilaterally:  Yes Comments Dry skin on both feet      PHQ2/9: Depression screen [facility] 2/9 03/23/2018 12/19/2017 09/22/2017 09/13/2016 04/29/2016  Decreased Interest 0 1 1 0 0  Down, Depressed, Hopeless 0 _0 0  PHQ - 2 Score 0 _1 0  Altered sleeping 0 0 3 - -  Tired, decreased energy 1 0 0 - -  Change in appetite _2 - -  Feeling bad or failure about yourself  0 0 0 - -  Trouble concentrating 0 0 0 - -  Moving slowly or fidgety/restless 0 0 0 - -  Suicidal thoughts 0 0 0 - -  PHQ-9 Score _3 - -  Difficult doing work/chores Not difficult at all Not difficult at all Not difficult at all - -     Fall Risk: Fall Risk  03/23/2018 12/19/2017 09/22/2017 06/18/2017 03/21/2017  Falls in the past year? _4     Functional Status Survey: Is the patient deaf or have difficulty hearing?: No Does the patient have difficulty seeing, even when wearing glasses/contacts?: No Does the patient have difficulty concentrating, remembering, or making decisions?: No Does the patient have difficulty walking or climbing stairs?: No Does the patient have difficulty dressing or bathing?: No Does the patient have difficulty doing errands alone such as visiting a doctor's office or shopping?: No   Assessment & Plan  1. Well woman exam  Discussed importance of 150 minutes of physical activity weekly, eat two servings of fish weekly, eat one serving of tree nuts ( cashews, pistachios, pecans, almonds.Marland Kitchen) every other day, eat 6 servings of fruit/vegetables daily and drink plenty of water and avoid sweet beverages.   2. Diabetic polyneuropathy associated with type 2 diabetes mellitus (HCC)  - Semaglutide (OZEMPIC) 1 MG/DOSE SOPN;  Inject 1 mg into the skin once a week.  Dispense: 9 mL; Refill: 1 - Insulin Glargine (TOUJEO SOLOSTAR) 300 UNIT/ML SOPN; Inject 30-50 Units into the skin daily.  Dispense: 18 mL; Refill: 1 - metFORMIN (GLUCOPHAGE XR) 750 MG 24 hr tablet; Take 2 tablets (1,500 mg total) by mouth daily with breakfast.  Dispense: 180 tablet; Refill: 1  3. Insomnia, persistent  - temazepam (RESTORIL) 30 MG capsule; Take 1 capsule (30 mg total) by mouth daily.  Dispense: 90 capsule; Refill: 1  4. Menopausal symptom  - sertraline (ZOLOFT) 100 MG tablet; Take 1 tablet (100 mg  total) by mouth daily.  Dispense: 90 tablet; Refill: 1  5. Gastroesophageal reflux disease without esophagitis  - pantoprazole (PROTONIX) 40 MG tablet; Take 1 tablet (40 mg total) by mouth daily. In place of Dexilant  Dispense: 90 tablet; Refill: 1  6. Seasonal allergic rhinitis, unspecified trigger  - montelukast (SINGULAIR) 10 MG tablet; Take 1 tablet (10 mg total) by mouth at bedtime.  Dispense: 90 tablet; Refill: 1  7. Benign hypertension  - olmesartan-hydrochlorothiazide (BENICAR HCT) 40-12.5 MG tablet; Take 1 tablet by mouth daily.  Dispense: 90 tablet; Refill: 1  8. Dyslipidemia  - atorvastatin (LIPITOR) 40 MG tablet; Take 1 tablet (40 mg total) by mouth daily.  Dispense: 90 tablet; Refill: 1  9. Major depression in remission ([place])  Taking Zoloft , felt down the week prior to anniversary of husband's death but is back to baseline   10. Morbid obesity ([place])  Discussed with the patient the risk posed by an increased BMI. Discussed importance of portion control, calorie counting and at least 150 minutes of physical activity weekly. Avoid sweet beverages and drink more water. Eat at least 6 servings of fruit and vegetables daily   Discussed medications, we will try belviq discussed possible side effects   11. Vitamin D deficiency  Continue otc supplementation   12. Right hip pain  Seeing chiropractor   13. Breast cancer  screening  Scheduled for today   14. Arthralgia, unspecified joint  - Lupus anticoagulant panel - Rheumatoid Factor - Sedimentation rate - C-reactive protein  15. Other eczema  - triamcinolone cream (KENALOG) 0.1 %; Apply 1 application topically 2 (two) times daily.  Dispense: 80 g; Refill: 0

## 2018-03-24 LAB — SEDIMENTATION RATE: SED RATE: 11 mm/h (ref 0–40)

## 2018-03-24 LAB — C-REACTIVE PROTEIN: CRP: 4 mg/L (ref 0–10)

## 2018-03-26 ENCOUNTER — Encounter: Payer: Self-pay | Admitting: Family Medicine

## 2018-05-21 ENCOUNTER — Ambulatory Visit
Admission: RE | Admit: 2018-05-21 | Discharge: 2018-05-21 | Disposition: A | Discharge status: Home / Self Care | Payer: Managed Care, Other (non HMO) | Source: Ambulatory Visit | Attending: Family Medicine | Admitting: Family Medicine

## 2018-05-21 DIAGNOSIS — Z1231 Encounter for screening mammogram for malignant neoplasm of breast: Secondary | ICD-10-CM

## 2018-05-21 LAB — LUPUS ANTICOAGULANT PANEL
Dilute Viper Venom Time: 27.6 s (ref 0.0–47.0)
PTT LA: 25.4 s (ref 0.0–51.9)

## 2018-05-21 LAB — RHEUMATOID FACTOR

## 2018-06-29 ENCOUNTER — Ambulatory Visit: Payer: Managed Care, Other (non HMO) | Admitting: Family Medicine

## 2018-06-29 ENCOUNTER — Encounter: Payer: Self-pay | Admitting: Family Medicine

## 2018-06-29 VITALS — BP 110/64 | HR 104 | Temp 98.5°F | Resp 16 | Ht 67.0 in | Wt 213.1 lb

## 2018-06-29 DIAGNOSIS — Z20828 Contact with and (suspected) exposure to other viral communicable diseases: Secondary | ICD-10-CM | POA: Diagnosis not present

## 2018-06-29 DIAGNOSIS — E1142 Type 2 diabetes mellitus with diabetic polyneuropathy: Secondary | ICD-10-CM

## 2018-06-29 DIAGNOSIS — N951 Menopausal and female climacteric states: Secondary | ICD-10-CM

## 2018-06-29 DIAGNOSIS — Z23 Encounter for immunization: Secondary | ICD-10-CM | POA: Diagnosis not present

## 2018-06-29 DIAGNOSIS — E785 Hyperlipidemia, unspecified: Secondary | ICD-10-CM

## 2018-06-29 DIAGNOSIS — I10 Essential (primary) hypertension: Secondary | ICD-10-CM

## 2018-06-29 DIAGNOSIS — G4700 Insomnia, unspecified: Secondary | ICD-10-CM

## 2018-06-29 DIAGNOSIS — J302 Other seasonal allergic rhinitis: Secondary | ICD-10-CM

## 2018-06-29 DIAGNOSIS — K219 Gastro-esophageal reflux disease without esophagitis: Secondary | ICD-10-CM

## 2018-06-29 DIAGNOSIS — B001 Herpesviral vesicular dermatitis: Secondary | ICD-10-CM

## 2018-06-29 LAB — POCT GLYCOSYLATED HEMOGLOBIN (HGB A1C): Hemoglobin A1C: 7.3 % — AB (ref 4.0–5.6)

## 2018-06-29 MED ORDER — METAXALONE 800 MG PO TABS: 800.0000 mg | ORAL_TABLET | Freq: Three times a day (TID) | ORAL | 0 refills | Status: DC

## 2018-06-29 MED ORDER — PANTOPRAZOLE SODIUM 40 MG PO TBEC: 40.0000 mg | DELAYED_RELEASE_TABLET | Freq: Every day | ORAL | 1 refills | Status: DC

## 2018-06-29 MED ORDER — SERTRALINE HCL 100 MG PO TABS: 100.0000 mg | ORAL_TABLET | Freq: Every day | ORAL | 1 refills | Status: DC

## 2018-06-29 MED ORDER — MONTELUKAST SODIUM 10 MG PO TABS: 10.0000 mg | ORAL_TABLET | Freq: Every day | ORAL | 1 refills | Status: DC

## 2018-06-29 MED ORDER — OSELTAMIVIR PHOSPHATE 75 MG PO CAPS: 75.0000 mg | ORAL_CAPSULE | Freq: Every day | ORAL | 0 refills | Status: DC

## 2018-06-29 MED ORDER — SEMAGLUTIDE (1 MG/DOSE) 2 MG/1.5ML ~~LOC~~ SOPN: 1.0000 mg | PEN_INJECTOR | SUBCUTANEOUS | 1 refills | Status: DC

## 2018-06-29 MED ORDER — INSULIN GLARGINE (2 UNIT DIAL) 300 UNIT/ML ~~LOC~~ SOPN: 55.0000 [IU] | PEN_INJECTOR | Freq: Every day | SUBCUTANEOUS | 1 refills | Status: DC

## 2018-06-29 MED ORDER — VALACYCLOVIR HCL 1 G PO TABS: 1000.0000 mg | ORAL_TABLET | Freq: Two times a day (BID) | ORAL | 0 refills | Status: DC

## 2018-06-29 MED ORDER — METFORMIN HCL ER 750 MG PO TB24: 1500.0000 mg | ORAL_TABLET | Freq: Every day | ORAL | 1 refills | Status: DC

## 2018-06-29 MED ORDER — OLMESARTAN MEDOXOMIL-HCTZ 40-12.5 MG PO TABS: 1.0000 | ORAL_TABLET | Freq: Every day | ORAL | 1 refills | Status: DC

## 2018-06-29 MED ORDER — TEMAZEPAM 30 MG PO CAPS: 30.0000 mg | ORAL_CAPSULE | Freq: Every day | ORAL | 1 refills | Status: DC

## 2018-06-29 MED ORDER — ATORVASTATIN CALCIUM 40 MG PO TABS: 40.0000 mg | ORAL_TABLET | Freq: Every day | ORAL | 1 refills | Status: DC

## 2018-06-29 NOTE — Progress Notes (Signed)
Name: Joyce Blackburn   MRN: [id number]    DOB: 02-07-1961   Date:06/29/2018       Progress Note  Subjective  Chief Complaint  Chief Complaint  Patient presents with  . Follow-up    3 mth f/u  . Diabetes  . Insomnia  . Gastroesophageal Reflux  . Allergic Reaction  . Hypertension  . Dyslipidemia  . Depression  . Obesity  . Back Pain  . Immunizations    MMR & Flu (patient's 56 yr old granddaughter was diagnosed with the influenza A on yesterday)  . Medication Refill    HPI  DMII: Shehas not beenchecking fsbs at home lately has been 120's-130's She denies polyphagia, polydipsia or polyuria. Started on Tresiba Fall 2016but is now on Toujeo because of insurance change, 55units of Toujeo, Metformin ( 1500 mg since last visit )  and Ozempic Taking ARB. She states that neuropathy pain has improved, only has occasional symptoms. Eye exam is up to date.  Urine micro today.  LastHgbA1C 6.6%,7% last visit  7.2% and today  7.3%  Fasting glucose 80-140's. Discussed adding medication but she states she has not increased Metformin to 1500 mg yet, only taking one daily and she will increase dose today and recheck in 3 months   GERD: she also has a history of gastric ulcer about 20 years ago. She states occasionally still has nausea with certain smells, like grease. She was seen by Dr. Durwin Reges and had multiple labs done for evaluation of fatty liver, HIDA was normal. She is onPantoprazole ( Dexilant no longer covered)at this time and is doing well,diarrhea resolved, discussed risk of bleeding with aspirin, she is tolerating it well. Doing well at this time.   OSA: wearing CPAP every night, wakes up feeling rested.  She sleeps with her grandchild, but compliant with CPAP   Dyslipidemia: taking Lipitor and is tolerating it well. No myalgia or chest pain.Reviewed labs   NOB:SJGG to goal, continue Benicar hctz, no side effects no chest pain or palpitation. Unchanged   Vitamin D  deficiency: advised her to resume vitamin D otc   Menopause/major depression in remission: she states Zoloft, and states hot flashes is still present but night sweats has improvedshe is doing better , phq 9  is still under control , still grieving the loss of her husband but doing well overall.   Insomnia: she is taking Temazepam and she has been sleeping well on medication.  Unchanged   Low back pain:symptoms started last year, going to chiropractor since 08/2017 and is doingbetter now, intermittent symptoms taking Meloxicam prnShe has noticed some other joint problems, left shoulder, right hip, sometimes swelling, inflammatory markers have been negative. She states very stiff and would like something to help with that   Morbid obesity: based on BMI and risk factors.  She lost a few pounds since last visit. She does not think secondary to Belviq and wants to stop because of cost of medication and seems ineffective.    Patient Active Problem List   Diagnosis Date Noted  . Major depression in remission ([place]) 12/19/2017  . Fatty liver 03/04/2016  . Vitamin D deficiency 03/04/2016  . Gastroesophageal reflux disease without esophagitis 07/04/2015  . Diabetic polyneuropathy associated with type 2 diabetes mellitus ([place]) 03/03/2015  . Peripheral neuropathy 03/03/2015  . Allergic to bees 02/20/2015  . Benign hypertension 02/20/2015  . Insomnia, persistent 02/20/2015  . Chronic idiopathic constipation 02/20/2015  . Dyslipidemia 02/20/2015  . Dermatitis, eczematoid 02/20/2015  .  H/O gastric ulcer 02/20/2015  . Herpes 02/20/2015  . Migraine without aura and responsive to treatment 02/20/2015  . Morbid obesity ([place]) 02/20/2015  . Obstructive apnea 02/20/2015  . Paresthesia of arm 02/20/2015  . Periodic limb movement 02/20/2015  . Allergic rhinitis, seasonal 02/20/2015  . Menopausal symptom 02/20/2015    Past Surgical History:  Procedure Laterality Date  . ABDOMINAL HYSTERECTOMY     . RETINAL TEAR REPAIR CRYOTHERAPY Right    [facility] (Dr. Noel Journey)    Family History  Problem Relation Age of Onset  . Mental illness Mother   . Diabetes Mother   . CVA Mother   . Schizophrenia Mother   . Diabetes Sister   . Hypertension Sister   . Diabetes Brother   . Mental illness Brother   . Bipolar disorder Brother   . Schizophrenia Brother   . Breast cancer Maternal Grandmother        great    Social History   Socioeconomic History  . Marital status: Widowed    Spouse name: Not on file  . Number of children: 0  . Years of education: Not on file  . Highest education level: Associate degree: occupational, Hotel manager, or vocational program  Occupational History  . 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 Financial resource strain: Not hard at all  . Food insecurity:    Worry: Never true    Inability: Never true  . Transportation needs:    Medical: No    Non-medical: No  Tobacco Use  . Smoking status: Former Smoker    Packs/day: 0.10    Years: 15.00    Pack years: 1.50    Last attempt to quit: 07/29/2004    Years since quitting: 13.9  . Smokeless tobacco: Never Used  Substance and Sexual Activity  . Alcohol use: No    Alcohol/week: 0.0 standard drinks  . Drug use: No  . Sexual activity: Not Currently  Lifestyle  . Physical activity:    Days per week: 0 days    Minutes per session: 0 min  . Stress: Not at all  Relationships  . Social connections:    Talks on phone: More than three times a week    Gets together: More than three times a week    Attends religious service: Never    Active member of club or organization: No    Attends meetings of clubs or organizations: Never    Relationship status: Widowed  . Intimate partner violence:    Fear of current or ex partner: No    Emotionally abused: No    Physically abused: No    Forced sexual activity: No  Other Topics Concern  . Not on file   Social History Narrative   She has 3 step-daughters, but just has contact with the one in [place] one in [place] and the one that was in [place] moved back home to stay with her - but daughter's husband is still in [place] - lives in [place].    She has 3 grand-daughter and one grandson    One Geographical information systems officer and grandson live with her.      Current Outpatient Medications:  .  aspirin 81 MG tablet, Take 81 mg by mouth daily., Disp: , Rfl:  .  atorvastatin (LIPITOR) 40 MG tablet, Take 1 tablet (40 mg total) by mouth daily., Disp: 90 tablet, Rfl: 1 .  EPINEPHRINE, ANAPHYLAXIS THERAPY  AGENTS,, EPIPEN 2-PAK, 0.3MG/0.3ML (Injection Device)  1 Device use as directed for 30 days  Quantity: 2;  Refills: 1   Ordered :06-Mar-2012  Steele Sizer MD;  Marisa Sprinkles Active Comments: DX: 989.5, Disp: , Rfl:  .  frovatriptan (FROVA) 2.5 MG tablet, Take 1 tablet by mouth as needed., Disp: , Rfl:  .  Insulin Glargine (TOUJEO SOLOSTAR) 300 UNIT/ML SOPN, Inject 30-50 Units into the skin daily., Disp: 18 mL, Rfl: 1 .  Insulin Pen Needle (NOVOFINE) 32G X 6 MM MISC, 1 each by Does not apply route 2 (two) times daily., Disp: 100 each, Rfl: 1 .  Lancets (ACCU-CHEK SOFT TOUCH) lancets, Use as instructed, Disp: 100 each, Rfl: 12 .  loratadine (CLARITIN) 10 MG tablet, Take 1 tablet (10 mg total) by mouth daily., Disp: 30 tablet, Rfl: 11 .  Lorcaserin HCl ER (BELVIQ XR) 20 MG TB24, Take 1 tablet by mouth daily., Disp: 30 tablet, Rfl: 2 .  meloxicam (MOBIC) 15 MG tablet, TAKE 1 TABLET BY MOUTH EVERY DAY (Patient taking differently: Take 15 mg by mouth as needed. ), Disp: 30 tablet, Rfl: 0 .  metFORMIN (GLUCOPHAGE XR) 750 MG 24 hr tablet, Take 2 tablets (1,500 mg total) by mouth daily with breakfast., Disp: 180 tablet, Rfl: 1 .  montelukast (SINGULAIR) 10 MG tablet, Take 1 tablet (10 mg total) by mouth at bedtime., Disp: 90 tablet, Rfl: 1 .  olmesartan-hydrochlorothiazide (BENICAR HCT) 40-12.5 MG tablet, Take 1 tablet by  mouth daily., Disp: 90 tablet, Rfl: 1 .  ONE TOUCH LANCETS MISC, 1 each by Does not apply route 2 (two) times daily., Disp: 100 each, Rfl: 1 .  ONE TOUCH ULTRA TEST test strip, USE AS INSTRUCTED TWO TIMES DAILY, Disp: 200 each, Rfl: 2 .  pantoprazole (PROTONIX) 40 MG tablet, Take 1 tablet (40 mg total) by mouth daily. In place of Dexilant, Disp: 90 tablet, Rfl: 1 .  Semaglutide (OZEMPIC) 1 MG/DOSE SOPN, Inject 1 mg into the skin once a week., Disp: 9 mL, Rfl: 1 .  sertraline (ZOLOFT) 100 MG tablet, Take 1 tablet (100 mg total) by mouth daily., Disp: 90 tablet, Rfl: 1 .  temazepam (RESTORIL) 30 MG capsule, Take 1 capsule (30 mg total) by mouth daily., Disp: 90 capsule, Rfl: 1 .  triamcinolone cream (KENALOG) 0.1 %, Apply 1 application topically 2 (two) times daily., Disp: 80 g, Rfl: 0 .  valACYclovir (VALTREX) 1000 MG tablet, Take 1 tablet (1,000 mg total) by mouth 2 (two) times daily. Prn, Disp: 30 tablet, Rfl: 0  Allergies  Allergen Reactions  . Ace Inhibitors     cough  . Bee Venom     Bee  . Dapagliflozin     hematuria    I personally reviewed active problem list, medication list, allergies, family history, social history with the patient/caregiver today.   ROS  Constitutional: Negative for fever or significant weight change.  Respiratory: Negative for cough and shortness of breath.   Cardiovascular: Negative for chest pain or palpitations.  Gastrointestinal: Negative for abdominal pain, no bowel changes.  Musculoskeletal: Negative for gait problem or joint swelling.  Skin: Negative for rash.  Neurological: Negative for dizziness but intermittent headache.  No other specific complaints in a complete review of systems (except as listed in HPI above).  Objective  Vitals:   06/29/18 0817  BP: 110/64  Pulse: (!) 104  Resp: 16  Temp: 98.5 F (36.9 C)  TempSrc: Oral  SpO2: 97%  Weight: 213 lb 1.6 oz (  96.7 kg)  Height: 5' 7" (1.702 m)    Body mass index is 33.38  kg/m.  Physical Exam  Constitutional: Patient appears well-developed and well-nourished. Obese No distress.  HEENT: head atraumatic, normocephalic, pupils equal and reactive to light,neck supple, throat within normal limits Cardiovascular: Normal rate, regular rhythm and normal heart sounds.  No murmur heard. No BLE edema. Pulmonary/Chest: Effort normal and breath sounds normal. No respiratory distress. Abdominal: Soft.  There is no tenderness. Psychiatric: Patient has a normal mood and affect. behavior is normal. Judgment and thought content normal.  Recent Results (from the past 2160 hour(s))  Lupus anticoagulant panel     Status: None   Collection Time: 05/20/18  8:36 AM  Result Value Ref Range   PTT Lupus Anticoagulant 25.4 0.0 - 51.9 sec   [place] Viper Venom Time 27.6 0.0 - 47.0 sec   Lupus Anticoag Interp Comment:     Comment: No lupus anticoagulant was detected.  Rheumatoid Factor     Status: None   Collection Time: 05/20/18  8:36 AM  Result Value Ref Range   Rhuematoid fact SerPl-aCnc <10.0 0.0 - 13.9 IU/mL     PHQ2/9: Depression screen [facility] 2/9 06/29/2018 03/23/2018 12/19/2017 09/22/2017 09/13/2016  Decreased Interest 0 0 1 1 0  Down, Depressed, Hopeless 0 0 _0 PHQ - 2 Score 0 0 _1 Altered sleeping 0 0 0 3 -  Tired, decreased energy 0 1 0 0 -  Change in appetite 0 _2 -  Feeling bad or failure about yourself  0 0 0 0 -  Trouble concentrating 0 0 0 0 -  Moving slowly or fidgety/restless 0 0 0 0 -  Suicidal thoughts 0 0 0 0 -  PHQ-9 Score 0 _3 -  Difficult doing work/chores Not difficult at all Not difficult at all Not difficult at all Not difficult at all -     Fall Risk: Fall Risk  06/29/2018 03/23/2018 12/19/2017 09/22/2017 06/18/2017  Falls in the past year? 0 No No No No     Functional Status Survey: Is the patient deaf or have difficulty hearing?: No Does the patient have difficulty seeing, even when wearing glasses/contacts?: No Does the patient  have difficulty concentrating, remembering, or making decisions?: No Does the patient have difficulty walking or climbing stairs?: Yes(due to back pain) Does the patient have difficulty dressing or bathing?: No Does the patient have difficulty doing errands alone such as visiting a doctor's office or shopping?: No    Assessment & Plan  1. Diabetic polyneuropathy associated with type 2 diabetes mellitus (HCC)  HgbA1C is going up up , today is 7.3%  - POCT HgB A1C - metFORMIN (GLUCOPHAGE XR) 750 MG 24 hr tablet; Take 2 tablets (1,500 mg total) by mouth daily with breakfast.  Dispense: 180 tablet; Refill: 1 - Semaglutide, 1 MG/DOSE, (OZEMPIC, 1 MG/DOSE,) 2 MG/1.5ML SOPN; Inject 1 mg into the skin once a week.  Dispense: 9 mL; Refill: 1 - Insulin Glargine, 2 Unit Dial, (TOUJEO MAX SOLOSTAR) 300 UNIT/ML SOPN; Inject 55 Units into the skin daily.  Dispense: 9 mL; Refill: 1  2. Need for MMR vaccine  - MMR vaccine subcutaneous  3. Needs flu shot  She will return for flu vaccine once she finishes tamiflu   4. Exposure to the flu  - oseltamivir (TAMIFLU) 75 MG capsule; Take 1 capsule (75 mg total) by mouth daily.  Dispense: 10 capsule; Refill: 0  5.  Dyslipidemia  - atorvastatin (LIPITOR) 40 MG tablet; Take 1 tablet (40 mg total) by mouth daily.  Dispense: 90 tablet; Refill: 1  6. Morbid obesity ([place])  Discussed with the patient the risk posed by an increased BMI. Discussed importance of portion control, calorie counting and at least 150 minutes of physical activity weekly. Avoid sweet beverages and drink more water. Eat at least 6 servings of fruit and vegetables daily   7. Seasonal allergic rhinitis, unspecified trigger  - montelukast (SINGULAIR) 10 MG tablet; Take 1 tablet (10 mg total) by mouth at bedtime.  Dispense: 90 tablet; Refill: 1  8. Benign hypertension  - olmesartan-hydrochlorothiazide (BENICAR HCT) 40-12.5 MG tablet; Take 1 tablet by mouth daily.  Dispense: 90 tablet;  Refill: 1  9. Gastroesophageal reflux disease without esophagitis  - pantoprazole (PROTONIX) 40 MG tablet; Take 1 tablet (40 mg total) by mouth daily. In place of Dexilant  Dispense: 90 tablet; Refill: 1  10. Menopausal symptom  - sertraline (ZOLOFT) 100 MG tablet; Take 1 tablet (100 mg total) by mouth daily.  Dispense: 90 tablet; Refill: 1  11. Insomnia, persistent  - temazepam (RESTORIL) 30 MG capsule; Take 1 capsule (30 mg total) by mouth daily.  Dispense: 90 capsule; Refill: 1  12. Fever blister  - valACYclovir (VALTREX) 1000 MG tablet; Take 1 tablet (1,000 mg total) by mouth 2 (two) times daily. Prn  Dispense: 30 tablet; Refill: 0

## 2018-06-30 ENCOUNTER — Ambulatory Visit: Payer: Self-pay | Admitting: *Deleted

## 2018-06-30 NOTE — Telephone Encounter (Signed)
Pt called regarding   "Pt stated Dr. Carlynn PurlSowles sent her medications to the wrong pharmacy. Pt stated the oseltamivir (TAMIFLU) 75 MG capsule was supposed to be sent to [facility] [place] and the metFORMIN (GLUCOPHAGE XR) 750 MG 24 hr tablet, atorvastatin (LIPITOR) 40 MG tablet, and metaxalone (SKELAXIN) 800 MG tablet were supposed to be sent to [facility]".  Pt voiced agreement regarding the medications mentioned above.  [facility]

## 2018-06-30 NOTE — Telephone Encounter (Signed)
[facility]ed in [facility] to [facility] [place] and [facility]. Also called and cancel the Metformin and Atorvastatin at [facility] and called them into [facility]. Patient has been notified.

## 2018-08-10 ENCOUNTER — Other Ambulatory Visit: Payer: Self-pay | Admitting: Family Medicine

## 2018-08-10 DIAGNOSIS — I10 Essential (primary) hypertension: Secondary | ICD-10-CM

## 2018-08-11 MED ORDER — OLMESARTAN MEDOXOMIL-HCTZ 40-12.5 MG PO TABS: 1.0000 | ORAL_TABLET | Freq: Every day | ORAL | 1 refills | Status: DC

## 2018-08-11 NOTE — Telephone Encounter (Signed)
Patient states she does fill the Benicar at [facility] and would like it to be sent there because they will usually send refill request when it is due.

## 2018-08-11 NOTE — Telephone Encounter (Signed)
Hypertension medication request: Olmesartan-HCTZ  Last office visit pertaining to hypertension: 06/29/2018   BP Readings from Last 3 Encounters:  06/29/18 110/64  03/23/18 118/68  12/19/17 110/70    Lab Results  Component Value Date   CREATININE 0.9 02/25/2018   BUN 11 03/04/2016   NA 142 03/04/2016   K 4.4 03/04/2016   CL 102 03/04/2016   CO2 26 03/04/2016     Follow up on 10/06/2018

## 2018-09-02 ENCOUNTER — Other Ambulatory Visit: Payer: Self-pay

## 2018-09-02 DIAGNOSIS — E1142 Type 2 diabetes mellitus with diabetic polyneuropathy: Secondary | ICD-10-CM

## 2018-09-02 MED ORDER — INSULIN PEN NEEDLE 32G X 6 MM MISC: 1.0000 | Freq: Two times a day (BID) | 1 refills | Status: DC

## 2018-09-02 NOTE — Telephone Encounter (Signed)
Refill request for diabetic medication:   Pen needle 32 g x 4 mm Novafine plus  Last office visit pertaining to diabetes: 06/29/2018   Lab Results  Component Value Date   HGBA1C 7.3 (A) 06/29/2018   Follow-ups on file. 10/06/2018

## 2018-10-06 ENCOUNTER — Encounter: Payer: Self-pay | Admitting: Family Medicine

## 2018-10-06 ENCOUNTER — Ambulatory Visit: Payer: Managed Care, Other (non HMO) | Admitting: Family Medicine

## 2018-10-06 VITALS — BP 100/66 | HR 95 | Temp 98.0°F | Resp 16 | Ht 67.0 in | Wt 207.6 lb

## 2018-10-06 DIAGNOSIS — F325 Major depressive disorder, single episode, in full remission: Secondary | ICD-10-CM

## 2018-10-06 DIAGNOSIS — E1140 Type 2 diabetes mellitus with diabetic neuropathy, unspecified: Secondary | ICD-10-CM | POA: Diagnosis not present

## 2018-10-06 DIAGNOSIS — Z23 Encounter for immunization: Secondary | ICD-10-CM | POA: Diagnosis not present

## 2018-10-06 DIAGNOSIS — G4700 Insomnia, unspecified: Secondary | ICD-10-CM

## 2018-10-06 DIAGNOSIS — E785 Hyperlipidemia, unspecified: Secondary | ICD-10-CM

## 2018-10-06 DIAGNOSIS — I10 Essential (primary) hypertension: Secondary | ICD-10-CM

## 2018-10-06 DIAGNOSIS — K219 Gastro-esophageal reflux disease without esophagitis: Secondary | ICD-10-CM

## 2018-10-06 DIAGNOSIS — B001 Herpesviral vesicular dermatitis: Secondary | ICD-10-CM

## 2018-10-06 LAB — POCT GLYCOSYLATED HEMOGLOBIN (HGB A1C): Hemoglobin A1C: 6.7 % — AB (ref 4.0–5.6)

## 2018-10-06 LAB — POCT UA - MICROALBUMIN: MICROALBUMIN (UR) POC: NEGATIVE mg/L

## 2018-10-06 MED ORDER — VALACYCLOVIR HCL 1 G PO TABS: 1000.0000 mg | ORAL_TABLET | Freq: Two times a day (BID) | ORAL | 0 refills | Status: DC

## 2018-10-06 NOTE — Patient Instructions (Signed)
Take half dose of Benicar HCTZ the week before your next visit

## 2018-10-06 NOTE — Progress Notes (Signed)
Name: Joyce Blackburn   MRN: [id number]    DOB: [birth year]   Date:10/06/2018       Progress Note  Subjective  Chief Complaint  Chief Complaint  Patient presents with  . Diabetes  . Hypertension  . Depression  . Dyslipidemia  . Obesity    HPI  DMII: Shehas not beenchecking fsbs on a regular basis. She denies polyphagia, polydipsia or polyuria. Started on Tresiba Fall 2016but is now on Toujeo because of insurance change, 55units of Toujeo, Metformin ( 1500 mg since last visit )  and Ozempic 1 gTaking ARB. She states that neuropathy pain has improved, only has occasional symptoms. Eye exam is up to date. Urine micro up to date LastHgbA1C 6.6%,7%, 7.2% , 7.3% and today was 6.7% .   GERD: she also has a history of gastric ulcer about 20 years ago. She states occasionally still has nausea with certain smells, like grease. She was seen by Dr. Lars Pinks and had multiple labs done for evaluation of fatty liver, HIDA was normal. She is onPantoprazole ( Dexilant no longer covered)at this time and is doing well,diarrhea resolved. Unchanged   NTI:RWERXVQ CPAP every night, wakes up feeling rested. She sleeps with her grandchild, but compliant with CPAP Unchanged   Dyslipidemia: taking Lipitor and is tolerating it well. No myalgia or chest pain. Unchanged   MGQ:QPYP to goal, continue Benicar hctz, no side effects no chest pain or palpitation, bp is towards low end of normal, but she denies dizziness.Advised to only take half pill the week prior to her next visit with me   Vitamin D deficiency:advised her to resume vitamin D otc  Menopause/major depression in remission: she states Zoloft, and states hot flashes is still present but night sweats has improvedshe is doing better ,phq 9 is still under control , still grieving the loss of her husband but doing well overall.   Insomnia: she is taking Temazepam andshe has been sleeping well on medication.No side effects of  medication     Low back pain:symptoms started last year, going to chiropractor since 08/2017 and is doingbetter now, intermittent symptoms taking Meloxicam prnShe has noticed some other joint problems, left shoulder, right hip, sometimes swelling, inflammatory markers have been negative. She states very stiff and would like something to help with that   Morbid obesity: based on BMI and risk factors. She lost a 6 more  pounds since last visit. Not on Belviq. She states she is stopping when she feels full instead of over eating, also drinking more water. She just returned from vacation and did not gain weight   Major Depression in Remission: she is doing well, she is ready for her daughter to move out. She wants to see her grandchildren for fun instead helping raising them. Denies sadness, denies suicidal thoughts or ideation   Patient Active Problem List   Diagnosis Date Noted  . Major depression in remission (HCC) 12/19/2017  . Fatty liver 03/04/2016  . Vitamin D deficiency 03/04/2016  . Gastroesophageal reflux disease without esophagitis 07/04/2015  . Diabetic polyneuropathy associated with type 2 diabetes mellitus (HCC) 03/03/2015  . Peripheral neuropathy 03/03/2015  . Allergic to bees 02/20/2015  . Benign hypertension 02/20/2015  . Insomnia, persistent 02/20/2015  . Chronic idiopathic constipation 02/20/2015  . Dyslipidemia 02/20/2015  . Dermatitis, eczematoid 02/20/2015  . H/O gastric ulcer 02/20/2015  . Herpes 02/20/2015  . Migraine without aura and responsive to treatment 02/20/2015  . Morbid obesity (HCC) 02/20/2015  . Obstructive apnea 02/20/2015  .  Paresthesia of arm 02/20/2015  . Periodic limb movement 02/20/2015  . Allergic rhinitis, seasonal 02/20/2015  . Menopausal symptom 02/20/2015    Past Surgical History:  Procedure Laterality Date  . ABDOMINAL HYSTERECTOMY    . RETINAL TEAR REPAIR CRYOTHERAPY Right    [facility] (Dr. Robin Searing)    Family History   Problem Relation Age of Onset  . Mental illness Mother   . Diabetes Mother   . CVA Mother   . Schizophrenia Mother   . Diabetes Sister   . Hypertension Sister   . Diabetes Brother   . Mental illness Brother   . Bipolar disorder Brother   . Schizophrenia Brother   . Breast cancer Maternal Grandmother        great    Social History   Socioeconomic History  . Marital status: Widowed    Spouse name: Not on file  . Number of children: 0  . Years of education: Not on file  . Highest education level: Associate degree: occupational, Scientist, product/process development, or vocational program  Occupational History  . 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 Financial resource strain: Not hard at all  . Food insecurity:    Worry: Never true    Inability: Never true  . Transportation needs:    Medical: No    Non-medical: No  Tobacco Use  . Smoking status: Former Smoker    Packs/day: 0.10    Years: 15.00    Pack years: 1.50    Last attempt to quit: 07/29/2004    Years since quitting: 14.1  . Smokeless tobacco: Never Used  Substance and Sexual Activity  . Alcohol use: No    Alcohol/week: 0.0 standard drinks  . Drug use: No  . Sexual activity: Not Currently  Lifestyle  . Physical activity:    Days per week: 0 days    Minutes per session: 0 min  . Stress: Not at all  Relationships  . Social connections:    Talks on phone: More than three times a week    Gets together: More than three times a week    Attends religious service: Never    Active member of club or organization: No    Attends meetings of clubs or organizations: Never    Relationship status: Widowed  . Intimate partner violence:    Fear of current or ex partner: No    Emotionally abused: No    Physically abused: No    Forced sexual activity: No  Other Topics Concern  . Not on file  Social History Narrative   She has 3 step-daughters, but just has contact with the one in [place]  one in [place] and the one that was in [place] moved back home to stay with her - but daughter's husband is still in [place] - lives in [place].    She has 3 grand-daughter and one grandson    One Engineer, maintenance (IT) and grandson live with her.      Current Outpatient Medications:  .  aspirin 81 MG tablet, Take 81 mg by mouth daily., Disp: , Rfl:  .  atorvastatin (LIPITOR) 40 MG tablet, Take 1 tablet (40 mg total) by mouth daily., Disp: 90 tablet, Rfl: 1 .  EPINEPHRINE, ANAPHYLAXIS THERAPY AGENTS,, EPIPEN 2-PAK, 0.3MG /0.3ML (Injection Device)  1 Device use as directed for 30 days  Quantity: 2;  Refills: 1   Ordered :06-Mar-2012  Alba Cory MD;  Mora Appl  06-Mar-2012 Active Comments: DX: 989.5, Disp: , Rfl:  .  Insulin Glargine, 2 Unit Dial, (TOUJEO MAX SOLOSTAR) 300 UNIT/ML SOPN, Inject 55 Units into the skin daily., Disp: 9 mL, Rfl: 1 .  Insulin Pen Needle (NOVOFINE) 32G X 6 MM MISC, 1 each by Does not apply route 2 (two) times daily., Disp: 100 each, Rfl: 1 .  Lancets (ACCU-CHEK SOFT TOUCH) lancets, Use as instructed, Disp: 100 each, Rfl: 12 .  loratadine (CLARITIN) 10 MG tablet, Take 1 tablet (10 mg total) by mouth daily., Disp: 30 tablet, Rfl: 11 .  meloxicam (MOBIC) 15 MG tablet, TAKE 1 TABLET BY MOUTH EVERY DAY (Patient taking differently: Take 15 mg by mouth as needed. ), Disp: 30 tablet, Rfl: 0 .  metaxalone (SKELAXIN) 800 MG tablet, Take 1 tablet (800 mg total) by mouth 3 (three) times daily., Disp: 90 tablet, Rfl: 0 .  metFORMIN (GLUCOPHAGE XR) 750 MG 24 hr tablet, Take 2 tablets (1,500 mg total) by mouth daily with breakfast., Disp: 180 tablet, Rfl: 1 .  montelukast (SINGULAIR) 10 MG tablet, Take 1 tablet (10 mg total) by mouth at bedtime., Disp: 90 tablet, Rfl: 1 .  olmesartan-hydrochlorothiazide (BENICAR HCT) 40-12.5 MG tablet, Take 1 tablet by mouth daily., Disp: 90 tablet, Rfl: 1 .  ONE TOUCH LANCETS MISC, 1 each by Does not apply route 2 (two) times daily., Disp: 100 each, Rfl: 1 .  ONE  TOUCH ULTRA TEST test strip, USE AS INSTRUCTED TWO TIMES DAILY, Disp: 200 each, Rfl: 2 .  pantoprazole (PROTONIX) 40 MG tablet, Take 1 tablet (40 mg total) by mouth daily. In place of Dexilant, Disp: 90 tablet, Rfl: 1 .  Semaglutide, 1 MG/DOSE, (OZEMPIC, 1 MG/DOSE,) 2 MG/1.5ML SOPN, Inject 1 mg into the skin once a week., Disp: 9 mL, Rfl: 1 .  sertraline (ZOLOFT) 100 MG tablet, Take 1 tablet (100 mg total) by mouth daily., Disp: 90 tablet, Rfl: 1 .  temazepam (RESTORIL) 30 MG capsule, Take 1 capsule (30 mg total) by mouth daily., Disp: 90 capsule, Rfl: 1 .  triamcinolone cream (KENALOG) 0.1 %, Apply 1 application topically 2 (two) times daily., Disp: 80 g, Rfl: 0 .  valACYclovir (VALTREX) 1000 MG tablet, Take 1 tablet (1,000 mg total) by mouth 2 (two) times daily. Prn, Disp: 30 tablet, Rfl: 0  Allergies  Allergen Reactions  . Ace Inhibitors     cough  . Bee Venom     Bee  . Dapagliflozin     hematuria    I personally reviewed active problem list, medication list, allergies, family history, social history with the patient/caregiver today.   ROS  Constitutional: Negative for fever, positive for  weight change.  Respiratory: Negative for cough and shortness of breath.   Cardiovascular: Negative for chest pain or palpitations.  Gastrointestinal: Negative for abdominal pain, no bowel changes.  Musculoskeletal: Negative for gait problem or joint swelling.  Skin: Negative for rash.  Neurological: Negative for dizziness or headache.  No other specific complaints in a complete review of systems (except as listed in HPI above).  Objective  Vitals:   10/06/18 0735  BP: 100/66  Pulse: 95  Resp: 16  Temp: 98 F (36.7 C)  TempSrc: Oral  SpO2: 98%  Weight: 207 lb 9.6 oz (94.2 kg)  Height: 5\' 7"  (1.702 m)    Body mass index is 32.51 kg/m.  Physical Exam  Constitutional: Patient appears well-developed and well-nourished. Obese  No distress.  HEENT: head atraumatic, normocephalic,  pupils equal  and reactive to light,  neck supple, throat within normal limits Cardiovascular: Normal rate, regular rhythm and normal heart sounds.  No murmur heard. No BLE edema. Pulmonary/Chest: Effort normal and breath sounds normal. No respiratory distress. Abdominal: Soft.  There is no tenderness. Psychiatric: Patient has a normal mood and affect. behavior is normal. Judgment and thought content normal.  Recent Results (from the past 2160 hour(s))  POCT HgB A1C     Status: Abnormal   Collection Time: 10/06/18  7:47 AM  Result Value Ref Range   Hemoglobin A1C 6.7 (A) 4.0 - 5.6 %   HbA1c POC (<> result, manual entry)     HbA1c, POC (prediabetic range)     HbA1c, POC (controlled diabetic range)    POCT UA - Microalbumin     Status: Normal   Collection Time: 10/06/18  7:47 AM  Result Value Ref Range   Microalbumin Ur, POC Negative mg/L   Creatinine, POC     Albumin/Creatinine Ratio, Urine, POC       PHQ2/9: Depression screen [facility] 2/9 10/06/2018 06/29/2018 03/23/2018 12/19/2017 09/22/2017  Decreased Interest 0 0 0 1 1  Down, Depressed, Hopeless 0 0 0 1 1  PHQ - 2 Score 0 0 0 2 2  Altered sleeping 3 0 0 0 3  Tired, decreased energy 0 0 1 0 0  Change in appetite 1 0 Feeling bad or failure about yourself  0 0 0 0 0  Trouble concentrating 0 0 0 0 0  Moving slowly or fidgety/restless 0 0 0 0 0  Suicidal thoughts 0 0 0 0 0  PHQ-9 Score 4 0 Difficult doing work/chores Not difficult at all Not difficult at all Not difficult at all Not difficult at all Not difficult at all    Fall Risk: Fall Risk  06/29/2018 03/23/2018 12/19/2017 09/22/2017 06/18/2017  Falls in the past year? 0 No No No No    Assessment & Plan  1. Controlled type 2 diabetes with neuropathy (HCC)  - POCT HgB A1C - POCT UA - Microalbumin  2. Flu vaccine need  - Flu Vaccine QUAD 36+ mos IM  3. Morbid obesity (HCC)  Discussed with the patient the risk posed by an increased BMI. Discussed importance of  portion control, calorie counting and at least 150 minutes of physical activity weekly. Avoid sweet beverages and drink more water. Eat at least 6 servings of fruit and vegetables daily   4. Major depression in remission (HCC)  Stable   5. Benign hypertension  Decreased dose of medication the week prior to her next visit   6. Gastroesophageal reflux disease without esophagitis  Stable  7. Dyslipidemia   Continue medication   8. Insomnia, persistent  Doing well on medication  9. Fever blister  - valACYclovir (VALTREX) 1000 MG tablet; Take 1 tablet (1,000 mg total) by mouth 2 (two) times daily. Prn  Dispense: 60 tablet; Refill: 0

## 2018-10-15 LAB — HM DIABETES EYE EXAM

## 2018-10-20 ENCOUNTER — Other Ambulatory Visit: Payer: Self-pay | Admitting: Family Medicine

## 2018-10-20 DIAGNOSIS — J302 Other seasonal allergic rhinitis: Secondary | ICD-10-CM

## 2018-10-21 NOTE — Telephone Encounter (Signed)
Refill request for general medication. Singulair     Last office visit 10/06/2018   Follow up on 01/27/2019

## 2018-10-25 ENCOUNTER — Other Ambulatory Visit: Payer: Self-pay | Admitting: Family Medicine

## 2018-10-25 DIAGNOSIS — N951 Menopausal and female climacteric states: Secondary | ICD-10-CM

## 2018-10-27 ENCOUNTER — Other Ambulatory Visit: Payer: Self-pay | Admitting: Family Medicine

## 2018-10-27 DIAGNOSIS — B001 Herpesviral vesicular dermatitis: Secondary | ICD-10-CM

## 2018-10-27 DIAGNOSIS — E1142 Type 2 diabetes mellitus with diabetic polyneuropathy: Secondary | ICD-10-CM

## 2018-10-27 DIAGNOSIS — G4700 Insomnia, unspecified: Secondary | ICD-10-CM

## 2018-10-27 NOTE — Telephone Encounter (Signed)
Request for diabetes medication. Ozempic to [facility].   Last office visit pertaining to diabetes: 10/06/2018   Lab Results  Component Value Date   HGBA1C 6.7 (A) 10/06/2018      Follow up on 01/27/2019

## 2018-10-28 ENCOUNTER — Other Ambulatory Visit: Payer: Self-pay

## 2018-10-28 MED ORDER — VALSARTAN-HYDROCHLOROTHIAZIDE 320-12.5 MG PO TABS: 1.0000 | ORAL_TABLET | Freq: Every day | ORAL | 1 refills | Status: DC

## 2018-10-28 NOTE — Telephone Encounter (Signed)
Refill request for general medication. Temazepam and Valtrex   Last office visit 10/06/2018   Follow up on 01/27/2019

## 2018-10-28 NOTE — Telephone Encounter (Signed)
Patient the Temazepam was sent to [facility] in [place] however she is unable to pick them up. She has to use [facility] for prescriptions other than short term use medications. Also she does NOT need a refill of Valtrex.

## 2018-10-29 ENCOUNTER — Other Ambulatory Visit: Payer: Self-pay

## 2018-10-30 MED ORDER — VALSARTAN-HYDROCHLOROTHIAZIDE 320-12.5 MG PO TABS: 1.0000 | ORAL_TABLET | Freq: Every day | ORAL | 1 refills | Status: DC

## 2018-10-31 ENCOUNTER — Other Ambulatory Visit: Payer: Self-pay | Admitting: Family Medicine

## 2018-10-31 DIAGNOSIS — E1142 Type 2 diabetes mellitus with diabetic polyneuropathy: Secondary | ICD-10-CM

## 2018-10-31 DIAGNOSIS — K219 Gastro-esophageal reflux disease without esophagitis: Secondary | ICD-10-CM

## 2018-12-15 ENCOUNTER — Encounter: Payer: Self-pay | Admitting: Family Medicine

## 2018-12-19 LAB — LUPUS ANTICOAGULANT PANEL
Dilute Viper Venom Time: 43.7 s (ref 0.0–47.0)
PTT Lupus Anticoagulant: 36.7 s (ref 0.0–51.9)

## 2018-12-19 LAB — RHEUMATOID FACTOR

## 2018-12-19 LAB — SPECIMEN STATUS REPORT

## 2018-12-21 ENCOUNTER — Other Ambulatory Visit: Payer: Self-pay | Admitting: Family Medicine

## 2018-12-21 ENCOUNTER — Encounter: Payer: Self-pay | Admitting: Family Medicine

## 2018-12-21 DIAGNOSIS — E1142 Type 2 diabetes mellitus with diabetic polyneuropathy: Secondary | ICD-10-CM

## 2019-01-16 ENCOUNTER — Other Ambulatory Visit: Payer: Self-pay | Admitting: Family Medicine

## 2019-01-16 DIAGNOSIS — G4700 Insomnia, unspecified: Secondary | ICD-10-CM

## 2019-01-27 ENCOUNTER — Ambulatory Visit: Payer: Managed Care, Other (non HMO) | Admitting: Family Medicine

## 2019-01-27 ENCOUNTER — Encounter: Payer: Self-pay | Admitting: Family Medicine

## 2019-01-27 ENCOUNTER — Other Ambulatory Visit: Payer: Self-pay

## 2019-01-27 VITALS — BP 100/70 | HR 82 | Temp 96.8°F | Resp 16 | Ht 67.0 in | Wt 200.1 lb

## 2019-01-27 DIAGNOSIS — M25551 Pain in right hip: Secondary | ICD-10-CM

## 2019-01-27 DIAGNOSIS — N951 Menopausal and female climacteric states: Secondary | ICD-10-CM

## 2019-01-27 DIAGNOSIS — F330 Major depressive disorder, recurrent, mild: Secondary | ICD-10-CM

## 2019-01-27 DIAGNOSIS — G4733 Obstructive sleep apnea (adult) (pediatric): Secondary | ICD-10-CM

## 2019-01-27 DIAGNOSIS — E785 Hyperlipidemia, unspecified: Secondary | ICD-10-CM | POA: Diagnosis not present

## 2019-01-27 DIAGNOSIS — G4700 Insomnia, unspecified: Secondary | ICD-10-CM

## 2019-01-27 DIAGNOSIS — Z9989 Dependence on other enabling machines and devices: Secondary | ICD-10-CM

## 2019-01-27 DIAGNOSIS — E1140 Type 2 diabetes mellitus with diabetic neuropathy, unspecified: Secondary | ICD-10-CM | POA: Diagnosis not present

## 2019-01-27 DIAGNOSIS — I10 Essential (primary) hypertension: Secondary | ICD-10-CM

## 2019-01-27 DIAGNOSIS — L308 Other specified dermatitis: Secondary | ICD-10-CM

## 2019-01-27 DIAGNOSIS — K219 Gastro-esophageal reflux disease without esophagitis: Secondary | ICD-10-CM

## 2019-01-27 LAB — POCT GLYCOSYLATED HEMOGLOBIN (HGB A1C): Hemoglobin A1C: 6.6 % — AB (ref 4.0–5.6)

## 2019-01-27 MED ORDER — ATORVASTATIN CALCIUM 40 MG PO TABS: 40.0000 mg | ORAL_TABLET | Freq: Every day | ORAL | 1 refills | Status: DC

## 2019-01-27 MED ORDER — TRIAMCINOLONE ACETONIDE 0.5 % EX OINT: 1.0000 "application " | TOPICAL_OINTMENT | Freq: Two times a day (BID) | CUTANEOUS | 0 refills | Status: DC

## 2019-01-27 MED ORDER — VALSARTAN-HYDROCHLOROTHIAZIDE 160-12.5 MG PO TABS: 1.0000 | ORAL_TABLET | Freq: Every day | ORAL | 0 refills | Status: DC

## 2019-01-27 NOTE — Progress Notes (Signed)
Name: Joyce Blackburn   MRN: [id number]    DOB: 07/29/1960   Date:01/27/2019       Progress Note  Subjective  Chief Complaint  Chief Complaint  Patient presents with  . Diabetes  . Depression  . Hypertension  . Dyslipidemia    HPI  DMII: Shehas not beenchecking fsbs on a regular basis. She denies polyphagia, polydipsia or polyuria. Started on Tresiba Fall 2016but is now on Toujeo because of insurance change, down to 50 units of Toujeo, Metformin ( 1500 mg since last visit )and Ozempic 1 gTaking ARB. She states that neuropathy pain has improved, only has occasional symptoms. Eye exam is up to date.Urine micro up to date LastHgbA1C 6.6%,7%,7.2% ,7.3%, 6.7%and today is 6.6% , discussed checking glucose fasting to see if we can go down on Toujeo level  GERD: she also has a history of gastric ulcer about 20 years ago. She states occasionally still has nausea with certain smells, like grease. She was seen by Dr. Lars PinksWhol and had multiple labs done for evaluation of fatty liver, HIDA was normal. She is onPantoprazole ( Dexilant no longer covered)at this time and is doing well,diarrhea resolved. Unchanged   OSA:she is no longer wearing CPAP every night. Discussed importance of resuming it   Dyslipidemia: taking Lipitor and is tolerating it well. No myalgia or chest pain. Unchanged   WUJ:WJXBHTN:back to goal, continue Valsartan hctz, no side effects no chest pain or palpitation, bp is towards low end of normal, but she denies dizziness.Discussed going down on dose to 160/12.5 mg daily   Vitamin D deficiency:advised her to resume vitamin D otc  Menopause/major depression mild : she states Zoloft, and states hot flashes is still present but night sweats has improvedshe is doing better ,phq 9 is up again , still grieving the loss of her husband, discussed adjusting medication, or counseling but she wants to hold off for now  Insomnia: she is taking Temazepam andshe has been  sleeping well on medication. No side effects of medication  Unchanged   Low back pain:symptoms started two year ago , she was going to chiropractor, but now she is only stretching at home. Pain is mostly on mid lower back and right hip and occasionally right lower radiculitis, she states she does not want any further evaluation or take any more medications at this time  Morbid obesity: based on BMI and risk factors. [place] another 7 lbs since last visit.  She states she is stopping when she feels full instead of over eating, also drinking more water, also drinking smoothies without sugar. She has been walking more now  Major Depression mild : she states she would like to have her own space again, still grieving the loss of her husband she is ready for her daughter to move out. She wants to see her grandchildren for fun instead helping raising them. Denies sadness, denies suicidal thoughts or ideation    Patient Active Problem List   Diagnosis Date Noted  . Major depression in remission (HCC) 12/19/2017  . Fatty liver 03/04/2016  . Vitamin D deficiency 03/04/2016  . Gastroesophageal reflux disease without esophagitis 07/04/2015  . Diabetic polyneuropathy associated with type 2 diabetes mellitus (HCC) 03/03/2015  . Peripheral neuropathy 03/03/2015  . Allergic to bees 02/20/2015  . Benign hypertension 02/20/2015  . Insomnia, persistent 02/20/2015  . Chronic idiopathic constipation 02/20/2015  . Dyslipidemia 02/20/2015  . Dermatitis, eczematoid 02/20/2015  . H/O gastric ulcer 02/20/2015  . Herpes 02/20/2015  . Migraine  without aura and responsive to treatment 02/20/2015  . Morbid obesity (HCC) 02/20/2015  . Obstructive apnea 02/20/2015  . Paresthesia of arm 02/20/2015  . Periodic limb movement 02/20/2015  . Allergic rhinitis, seasonal 02/20/2015  . Menopausal symptom 02/20/2015    Past Surgical History:  Procedure Laterality Date  . ABDOMINAL HYSTERECTOMY    . RETINAL TEAR  REPAIR CRYOTHERAPY Right    [facility] (Dr. Robin SearingMarchase)    Family History  Problem Relation Age of Onset  . Mental illness Mother   . Diabetes Mother   . CVA Mother   . Schizophrenia Mother   . Diabetes Sister   . Hypertension Sister   . Diabetes Brother   . Mental illness Brother   . Bipolar disorder Brother   . Schizophrenia Brother   . Breast cancer Maternal Grandmother        great    Social History   Socioeconomic History  . Marital status: Widowed    Spouse name: Not on file  . Number of children: 0  . Years of education: Not on file  . Highest education level: Associate degree: occupational, Scientist, product/process developmenttechnical, or vocational program  Occupational History  . Occupation: Radio broadcast assistantteam leader     Employer: [employer]    Comment: customer service   Social Needs  . Financial resource strain: Not hard at all  . Food insecurity    Worry: Never true    Inability: Never true  . Transportation needs    Medical: No    Non-medical: No  Tobacco Use  . Smoking status: Former Smoker    Packs/day: 0.10    Years: 15.00    Pack years: 1.50    Quit date: 07/29/2004    Years since quitting: 14.5  . Smokeless tobacco: Never Used  Substance and Sexual Activity  . Alcohol use: No    Alcohol/week: 0.0 standard drinks  . Drug use: No  . Sexual activity: Not Currently  Lifestyle  . Physical activity    Days per week: 0 days    Minutes per session: 0 min  . Stress: Not at all  Relationships  . Social connections    Talks on phone: More than three times a week    Gets together: More than three times a week    Attends religious service: Never    Active member of club or organization: No    Attends meetings of clubs or organizations: Never    Relationship status: Widowed  . Intimate partner violence    Fear of current or ex partner: No    Emotionally abused: No    Physically abused: No    Forced sexual activity: No  Other Topics Concern  . Not on file  Social History Narrative   She has  3 step-daughters, but just has contact with the one in [place] in [place] and the one that was in [place] moved back home to stay with her - but daughter's husband is still in [place] - [place].    She has 3 grand-daughter and one grandson    One Engineer, maintenance (IT)grand-daughter and grandson live with her.      Current Outpatient Medications:  .  aspirin 81 MG tablet, Take 81 mg by mouth daily., Disp: , Rfl:  .  atorvastatin (LIPITOR) 40 MG tablet, Take 1 tablet (40 mg total) by mouth daily., Disp: 90 tablet, Rfl: 1 .  EPINEPHRINE, ANAPHYLAXIS THERAPY AGENTS,, EPIPEN 2-PAK, 0.3MG /0.3ML (Injection Device)  1 Device use as directed for  30 days  Quantity: 2;  Refills: 1   Ordered :06-Mar-2012  Alba CorySOWLES, Uriel Horkey MD;  Started 06-Mar-2012 Active Comments: DX: 989.5, Disp: , Rfl:  .  Insulin Glargine, 1 Unit Dial, (TOUJEO SOLOSTAR) 300 UNIT/ML SOPN, Inject 50 Units into the skin daily., Disp: 18 mL, Rfl: 1 .  Insulin Pen Needle (NOVOFINE) 32G X 6 MM MISC, 1 each by Does not apply route 2 (two) times daily., Disp: 100 each, Rfl: 1 .  Lancets (ACCU-CHEK SOFT TOUCH) lancets, Use as instructed, Disp: 100 each, Rfl: 12 .  loratadine (CLARITIN) 10 MG tablet, Take 1 tablet (10 mg total) by mouth daily., Disp: 30 tablet, Rfl: 11 .  meloxicam (MOBIC) 15 MG tablet, TAKE 1 TABLET BY MOUTH EVERY DAY (Patient taking differently: Take 15 mg by mouth as needed. ), Disp: 30 tablet, Rfl: 0 .  metaxalone (SKELAXIN) 800 MG tablet, Take 1 tablet (800 mg total) by mouth 3 (three) times daily., Disp: 90 tablet, Rfl: 0 .  metFORMIN (GLUCOPHAGE-XR) 750 MG 24 hr tablet, TAKE 2 TABLETS BY MOUTH  DAILY WITH BREAKFAST, Disp: 180 tablet, Rfl: 0 .  montelukast (SINGULAIR) 10 MG tablet, TAKE 1 TABLET BY MOUTH AT  BEDTIME, Disp: 90 tablet, Rfl: 1 .  olmesartan-hydrochlorothiazide (BENICAR HCT) 40-12.5 MG tablet, Take 1 tablet by mouth daily., Disp: 90 tablet, Rfl: 1 .  ONE TOUCH LANCETS MISC, 1 each by Does not apply route 2 (two) times daily.,  Disp: 100 each, Rfl: 1 .  ONE TOUCH ULTRA TEST test strip, USE AS INSTRUCTED TWO TIMES DAILY, Disp: 200 each, Rfl: 2 .  OZEMPIC, 1 MG/DOSE, 2 MG/1.5ML SOPN, INJECT 1 MG INTO THE SKIN  ONCE A WEEK., Disp: 9 mL, Rfl: 1 .  pantoprazole (PROTONIX) 40 MG tablet, TAKE 1 TABLET BY MOUTH  DAILY. IN PLACE OF DEXILANT, Disp: 90 tablet, Rfl: 1 .  sertraline (ZOLOFT) 100 MG tablet, TAKE 1 TABLET BY MOUTH  DAILY, Disp: 90 tablet, Rfl: 1 .  temazepam (RESTORIL) 30 MG capsule, TAKE 1 CAPSULE BY MOUTH  DAILY, Disp: 90 capsule, Rfl: 0 .  triamcinolone cream (KENALOG) 0.1 %, Apply 1 application topically 2 (two) times daily., Disp: 80 g, Rfl: 0 .  valACYclovir (VALTREX) 1000 MG tablet, Take 1 tablet (1,000 mg total) by mouth 2 (two) times daily. Prn, Disp: 60 tablet, Rfl: 0 .  valsartan-hydrochlorothiazide (DIOVAN-HCT) 320-12.5 MG tablet, Take 1 tablet by mouth daily., Disp: 90 tablet, Rfl: 1  Allergies  Allergen Reactions  . Ace Inhibitors     cough  . Bee Venom     Bee  . Dapagliflozin     hematuria    I personally reviewed active problem list, medication list, allergies, family history, social history with the patient/caregiver today.   ROS  Constitutional: Negative for fever or weight change.  Respiratory: Negative for cough and shortness of breath.   Cardiovascular: Negative for chest pain or palpitations.  Gastrointestinal: Negative for abdominal pain, no bowel changes.  Musculoskeletal: Negative for gait problem or joint swelling.  Skin: Negative for rash.  Neurological: Negative for dizziness or headache.  No other specific complaints in a complete review of systems (except as listed in HPI above).  Objective  Vitals:   01/27/19 0857  BP: 100/70  Pulse: 82  Resp: 16  Temp: (!) 96.8 F (36 C)  TempSrc: Oral  SpO2: 96%  Weight: 200 lb 1.6 oz (90.8 kg)  Height: 5\' 7"  (1.702 m)    Body mass index is 31.34 kg/m.  Physical Exam  Constitutional: Patient appears well-developed and  well-nourished. Obese  No distress.  HEENT: head atraumatic, normocephalic, pupils equal and reactive to light,  neck supple, oral mucosa not done  Cardiovascular: Normal rate, regular rhythm and normal heart sounds.  No murmur heard. No BLE edema. Pulmonary/Chest: Effort normal and breath sounds normal. No respiratory distress. Abdominal: Soft.  There is no tenderness. Psychiatric: Patient has a normal mood and affect. behavior is normal. Judgment and thought content normal.    PHQ2/9: Depression screen [facility] 2/9 01/27/2019 10/06/2018 06/29/2018 03/23/2018 12/19/2017  Decreased Interest 1 0 0 0 1  Down, Depressed, Hopeless 1 0 0 0 1  PHQ - 2 Score 2 0 0 0 2  Altered sleeping 3 3 0 0 0  Tired, decreased energy 0 0 0 1 0  Change in appetite 1 1 0 1 1  Feeling bad or failure about yourself  0 0 0 0 0  Trouble concentrating 0 0 0 0 0  Moving slowly or fidgety/restless 0 0 0 0 0  Suicidal thoughts 0 0 0 0 0  PHQ-9 Score 6 4 0 2 3  Difficult doing work/chores Not difficult at all Not difficult at all Not difficult at all Not difficult at all Not difficult at all    phq 9 is positive   Fall Risk: Fall Risk  01/27/2019 06/29/2018 03/23/2018 12/19/2017 09/22/2017  Falls in the past year? 0 0 No No No  Number falls in past yr: 0 - - - -  Injury with Fall? 0 - - - -    Functional Status Survey: Is the patient deaf or have difficulty hearing?: No Does the patient have difficulty seeing, even when wearing glasses/contacts?: No Does the patient have difficulty concentrating, remembering, or making decisions?: No Does the patient have difficulty walking or climbing stairs?: No Does the patient have difficulty dressing or bathing?: No Does the patient have difficulty doing errands alone such as visiting a doctor's office or shopping?: No   Assessment & Plan   1. Controlled type 2 diabetes with neuropathy (HCC)  - POCT HgB A1C  2. Dyslipidemia  - atorvastatin (LIPITOR) 40 MG tablet; Take 1  tablet (40 mg total) by mouth daily.  Dispense: 90 tablet; Refill: 1  3. Insomnia, persistent  Doing well on medication   4. Benign hypertension  - valsartan-hydrochlorothiazide (DIOVAN HCT) 160-12.5 MG tablet; Take 1 tablet by mouth daily.  Dispense: 90 tablet; Refill: 0  5. Mild recurrent major depression ([place])  Discussed going up on dose of zoloft or counseling but she wants to hold off   6. Right hip pain   7. Menopausal symptom  Controlled with medication   8. OSA on CPAP  Needs to resume CPAP   9. Gastroesophageal reflux disease without esophagitis  Controlled   10. Other eczema  - triamcinolone ointment (KENALOG) 0.5 %; Apply 1 application topically 2 (two) times daily.  Dispense: 60 g; Refill: 0

## 2019-02-12 ENCOUNTER — Other Ambulatory Visit: Payer: Self-pay

## 2019-02-12 DIAGNOSIS — G4700 Insomnia, unspecified: Secondary | ICD-10-CM

## 2019-02-15 ENCOUNTER — Other Ambulatory Visit: Payer: Self-pay | Admitting: Family Medicine

## 2019-02-15 DIAGNOSIS — G4700 Insomnia, unspecified: Secondary | ICD-10-CM

## 2019-02-15 NOTE — Telephone Encounter (Signed)
PT never received temazepam back in June rx failed. [facility]. Pt will [facility] [place] [facility] . Pt would like 2 wk supply

## 2019-02-16 MED ORDER — TEMAZEPAM 30 MG PO CAPS: 30.0000 mg | ORAL_CAPSULE | Freq: Every day | ORAL | 0 refills | Status: DC

## 2019-02-16 NOTE — Telephone Encounter (Signed)
Spoke with [facility] and they did not receive the prescription from January 18, 2019 nor the faxed form. They are asking we resend it electronically due to it being a controlled substance.

## 2019-03-14 ENCOUNTER — Other Ambulatory Visit: Payer: Self-pay | Admitting: Family Medicine

## 2019-03-14 DIAGNOSIS — K219 Gastro-esophageal reflux disease without esophagitis: Secondary | ICD-10-CM

## 2019-03-17 ENCOUNTER — Other Ambulatory Visit: Payer: Self-pay | Admitting: Family Medicine

## 2019-03-17 DIAGNOSIS — I10 Essential (primary) hypertension: Secondary | ICD-10-CM

## 2019-03-17 NOTE — Telephone Encounter (Signed)
Hypertension medication request: Valsartan-HCTZ   Last office visit pertaining to hypertension: 01/27/2019   BP Readings from Last 3 Encounters:  01/27/19 100/70  10/06/18 100/66  06/29/18 110/64    Lab Results  Component Value Date   CREATININE 0.9 02/25/2018   BUN 11 03/04/2016   NA 142 03/04/2016   K 4.4 03/04/2016   CL 102 03/04/2016   CO2 26 03/04/2016     Follow up on 04/30/2019

## 2019-04-14 ENCOUNTER — Other Ambulatory Visit: Payer: Self-pay | Admitting: Family Medicine

## 2019-04-14 DIAGNOSIS — N951 Menopausal and female climacteric states: Secondary | ICD-10-CM

## 2019-04-15 NOTE — Telephone Encounter (Signed)
Requested medication (s) are due for refill today: yes  Requested medication (s) are on the active medication list: yes  Last refill:  02/18/2019  Future visit scheduled: yes  Notes to clinic:  Requesting 1 year supply    Requested Prescriptions  Pending Prescriptions Disp Refills   sertraline (ZOLOFT) 100 MG tablet [Pharmacy Med Name: SERTRALINE HCL 100MG  TABLET] 90 tablet 3    Sig: TAKE 1 TABLET BY MOUTH  DAILY     Psychiatry:  Antidepressants - SSRI Passed - 04/14/2019  9:13 PM      Passed - Valid encounter within last 6 months    Recent Outpatient Visits          2 months ago Controlled type 2 diabetes with neuropathy [facility])   [facility] Steele Sizer, MD   6 months ago Controlled type 2 diabetes with neuropathy [facility])   [facility], Drue Stager, MD   9 months ago Diabetic polyneuropathy associated with type 2 diabetes mellitus [facility])   [facility] Steele Sizer, MD   1 year ago Well woman exam   [facility] Steele Sizer, MD   1 year ago Diabetic polyneuropathy associated with type 2 diabetes mellitus [facility])   [facility] Steele Sizer, MD      Future Appointments            In 2 weeks Steele Sizer, MD [place] - Completed PHQ-2 or PHQ-9 in the last 360 days.

## 2019-04-30 ENCOUNTER — Other Ambulatory Visit: Payer: Self-pay

## 2019-04-30 ENCOUNTER — Ambulatory Visit: Payer: Managed Care, Other (non HMO) | Admitting: Family Medicine

## 2019-04-30 ENCOUNTER — Encounter: Payer: Self-pay | Admitting: Family Medicine

## 2019-04-30 VITALS — BP 110/64 | Resp 16 | Ht 67.0 in | Wt 198.0 lb

## 2019-04-30 DIAGNOSIS — Z23 Encounter for immunization: Secondary | ICD-10-CM

## 2019-04-30 DIAGNOSIS — E1140 Type 2 diabetes mellitus with diabetic neuropathy, unspecified: Secondary | ICD-10-CM | POA: Diagnosis not present

## 2019-04-30 DIAGNOSIS — I10 Essential (primary) hypertension: Secondary | ICD-10-CM

## 2019-04-30 DIAGNOSIS — Z9989 Dependence on other enabling machines and devices: Secondary | ICD-10-CM

## 2019-04-30 DIAGNOSIS — J302 Other seasonal allergic rhinitis: Secondary | ICD-10-CM

## 2019-04-30 DIAGNOSIS — F330 Major depressive disorder, recurrent, mild: Secondary | ICD-10-CM

## 2019-04-30 DIAGNOSIS — E559 Vitamin D deficiency, unspecified: Secondary | ICD-10-CM

## 2019-04-30 DIAGNOSIS — E785 Hyperlipidemia, unspecified: Secondary | ICD-10-CM | POA: Diagnosis not present

## 2019-04-30 DIAGNOSIS — G4700 Insomnia, unspecified: Secondary | ICD-10-CM

## 2019-04-30 DIAGNOSIS — E1169 Type 2 diabetes mellitus with other specified complication: Secondary | ICD-10-CM

## 2019-04-30 DIAGNOSIS — G4733 Obstructive sleep apnea (adult) (pediatric): Secondary | ICD-10-CM

## 2019-04-30 DIAGNOSIS — K219 Gastro-esophageal reflux disease without esophagitis: Secondary | ICD-10-CM

## 2019-04-30 DIAGNOSIS — E1142 Type 2 diabetes mellitus with diabetic polyneuropathy: Secondary | ICD-10-CM

## 2019-04-30 DIAGNOSIS — N951 Menopausal and female climacteric states: Secondary | ICD-10-CM

## 2019-04-30 LAB — POCT GLYCOSYLATED HEMOGLOBIN (HGB A1C): HbA1c, POC (controlled diabetic range): 6.5 % (ref 0.0–7.0)

## 2019-04-30 MED ORDER — METFORMIN HCL ER 750 MG PO TB24: 1500.0000 mg | ORAL_TABLET | Freq: Every day | ORAL | 1 refills | Status: DC

## 2019-04-30 MED ORDER — SERTRALINE HCL 100 MG PO TABS: 100.0000 mg | ORAL_TABLET | Freq: Every day | ORAL | 0 refills | Status: DC

## 2019-04-30 MED ORDER — MONTELUKAST SODIUM 10 MG PO TABS: 10.0000 mg | ORAL_TABLET | Freq: Every day | ORAL | 1 refills | Status: DC

## 2019-04-30 MED ORDER — TEMAZEPAM 30 MG PO CAPS: 30.0000 mg | ORAL_CAPSULE | Freq: Every day | ORAL | 0 refills | Status: DC

## 2019-04-30 MED ORDER — OZEMPIC (1 MG/DOSE) 2 MG/1.5ML ~~LOC~~ SOPN: 1.0000 mg | PEN_INJECTOR | SUBCUTANEOUS | 1 refills | Status: DC

## 2019-04-30 NOTE — Progress Notes (Signed)
Name: Joyce Blackburn   MRN: [id number]    DOB: Jun 19, 1961   Date:04/30/2019       Progress Note  Subjective  Chief Complaint  Chief Complaint  Patient presents with  . Medication Refill    3 month F/U  . Diabetes  . Depression  . Hypertension    Denies any symptoms  . Dyslipidemia  . Gastroesophageal Reflux  . Insomnia    HPI  DMII: Shehas not beenchecking fsbson a regular basis.She denies polyphagia, polydipsia or polyuria. Started on Tresiba Fall 2016but is now on Toujeo because of insurance change, down to 50 units of Toujeo, Metformin ( 1500 mg since last visit )and Ozempic1 gTaking ARB. She states that neuropathy pain has improved, only has occasional symptoms. Eye exam is up to date.Urine microup to dateLastHgbA1C 6.6%,7%,7.2%,7.3%, 6.7%,6.6% and today is 6.5%  discussed checking glucose fasting, advised to go down to 48 units daily and monitor   GERD: she also has a history of gastric ulcer over 20  years ago. She states occasionally still has nausea with certain smells, like grease. She was seen by Dr. Durwin Reges and had multiple labs done for evaluation of fatty liver, HIDA was normal. She is onPantoprazole ( Dexilant no longer covered), very seldom has diarrhea but usually triggered by fatty diet and she seldom has that   OSA:she is back on wearing CPAP most nights of the week, she notices an improvement of energy during the day.   Dyslipidemia: taking Lipitor and is tolerating it well. No myalgia or chest pain.Unchanged  HTN: bp is at goal today, no chest pain or palpitation   Vitamin D deficiency:she is taking supplements now, we will recheck labs   Menopause/major depression mild : she states Zoloft 100 mg  and states hot flashes is still present but night sweats has improvedshe is doing better ,phq 9 is down. Ready for daughter, son in law and children to move out, but states if they don't she will sell it and get a smaller place to live    Insomnia: she is taking Temazepam andshe has been sleeping well on medication. No side effects of medication. Unchanged   Low back pain:symptoms started two year ago , she was going to chiropractor, but now she is only stretching at home. Pain is mostly on mid lower back and right hip and occasionally right lower radiculitis, she states she does not want any further evaluation or take any more medications at this time  Morbid obesity: based on BMI and risk factors. [place] another 7 lbs since last visit.  She states she is stopping when she feels full instead of over eating, also drinking more water, also drinking smoothies without sugar. She has been walking more now   Patient Active Problem List   Diagnosis Date Noted  . Major depression in remission ([facility]) 12/19/2017  . Fatty liver 03/04/2016  . Vitamin D deficiency 03/04/2016  . Gastroesophageal reflux disease without esophagitis 07/04/2015  . Diabetic polyneuropathy associated with type 2 diabetes mellitus ([facility]) 03/03/2015  . Peripheral neuropathy 03/03/2015  . Allergic to bees 02/20/2015  . Benign hypertension 02/20/2015  . Insomnia, persistent 02/20/2015  . Chronic idiopathic constipation 02/20/2015  . Dyslipidemia 02/20/2015  . Dermatitis, eczematoid 02/20/2015  . H/O gastric ulcer 02/20/2015  . Herpes 02/20/2015  . Migraine without aura and responsive to treatment 02/20/2015  . Morbid obesity ([facility]) 02/20/2015  . Obstructive apnea 02/20/2015  . Paresthesia of arm 02/20/2015  . Periodic limb movement 02/20/2015  .  Allergic rhinitis, seasonal 02/20/2015  . Menopausal symptom 02/20/2015    Past Surgical History:  Procedure Laterality Date  . ABDOMINAL HYSTERECTOMY    . RETINAL TEAR REPAIR CRYOTHERAPY Right    [facility] (Dr. Robin Searing)    Family History  Problem Relation Age of Onset  . Mental illness Mother   . Diabetes Mother   . CVA Mother   . Schizophrenia Mother   . Diabetes Sister   . Hypertension  Sister   . Diabetes Brother   . Mental illness Brother   . Bipolar disorder Brother   . Schizophrenia Brother   . Breast cancer Maternal Grandmother        great    Social History   Socioeconomic History  . Marital status: Widowed    Spouse name: Not on file  . Number of children: 0  . Years of education: Not on file  . Highest education level: Associate degree: occupational, Scientist, product/process development, or vocational program  Occupational History  . 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 Financial resource strain: Not hard at all  . Food insecurity    Worry: Never true    Inability: Never true  . Transportation needs    Medical: No    Non-medical: No  Tobacco Use  . Smoking status: Former Smoker    Packs/day: 0.10    Years: 15.00    Pack years: 1.50    Quit date: 07/29/2004    Years since quitting: 14.7  . Smokeless tobacco: Never Used  Substance and Sexual Activity  . Alcohol use: No    Alcohol/week: 0.0 standard drinks  . Drug use: No  . Sexual activity: Not Currently  Lifestyle  . Physical activity    Days per week: 0 days    Minutes per session: 0 min  . Stress: Not at all  Relationships  . Social connections    Talks on phone: More than three times a week    Gets together: More than three times a week    Attends religious service: Never    Active member of club or organization: No    Attends meetings of clubs or organizations: Never    Relationship status: Widowed  . Intimate partner violence    Fear of current or ex partner: No    Emotionally abused: No    Physically abused: No    Forced sexual activity: No  Other Topics Concern  . Not on file  Social History Narrative   She has 3 step-daughters, but just has contact with the one in [place] one in [place] and the one that was in [place] moved back home to stay with her - but daughter's husband is still in [place] - lives in [place].    She has 3 grand-daughter and  one grandson    One Engineer, maintenance (IT) and grandson live with her.      Current Outpatient Medications:  .  aspirin 81 MG tablet, Take 81 mg by mouth daily., Disp: , Rfl:  .  atorvastatin (LIPITOR) 40 MG tablet, Take 1 tablet (40 mg total) by mouth daily., Disp: 90 tablet, Rfl: 1 .  EPINEPHRINE, ANAPHYLAXIS THERAPY AGENTS,, EPIPEN 2-PAK, 0.3MG /0.3ML (Injection Device)  1 Device use as directed for 30 days  Quantity: 2;  Refills: 1   Ordered :06-Mar-2012  Alba Cory MD;  Started 06-Mar-2012 Active Comments: DX: 989.5, Disp: , Rfl:  .  Insulin Glargine, 1  Unit Dial, (TOUJEO SOLOSTAR) 300 UNIT/ML SOPN, Inject 50 Units into the skin daily., Disp: 18 mL, Rfl: 1 .  Insulin Pen Needle (NOVOFINE) 32G X 6 MM MISC, 1 each by Does not apply route 2 (two) times daily., Disp: 100 each, Rfl: 1 .  Lancets (ACCU-CHEK SOFT TOUCH) lancets, Use as instructed, Disp: 100 each, Rfl: 12 .  loratadine (CLARITIN) 10 MG tablet, Take 1 tablet (10 mg total) by mouth daily., Disp: 30 tablet, Rfl: 11 .  meloxicam (MOBIC) 15 MG tablet, TAKE 1 TABLET BY MOUTH EVERY DAY (Patient taking differently: Take 15 mg by mouth as needed. ), Disp: 30 tablet, Rfl: 0 .  metaxalone (SKELAXIN) 800 MG tablet, Take 1 tablet (800 mg total) by mouth 3 (three) times daily., Disp: 90 tablet, Rfl: 0 .  metFORMIN (GLUCOPHAGE-XR) 750 MG 24 hr tablet, Take 2 tablets (1,500 mg total) by mouth daily with breakfast., Disp: 180 tablet, Rfl: 1 .  montelukast (SINGULAIR) 10 MG tablet, Take 1 tablet (10 mg total) by mouth at bedtime., Disp: 90 tablet, Rfl: 1 .  ONE TOUCH LANCETS MISC, 1 each by Does not apply route 2 (two) times daily., Disp: 100 each, Rfl: 1 .  ONE TOUCH ULTRA TEST test strip, USE AS INSTRUCTED TWO TIMES DAILY, Disp: 200 each, Rfl: 2 .  pantoprazole (PROTONIX) 40 MG tablet, TAKE 1 TABLET BY MOUTH  DAILY IN PLACE OF DEXILANT, Disp: 90 tablet, Rfl: 3 .  Semaglutide, 1 MG/DOSE, (OZEMPIC, 1 MG/DOSE,) 2 MG/1.5ML SOPN, Inject 1 mg into the skin  once a week., Disp: 9 mL, Rfl: 1 .  sertraline (ZOLOFT) 100 MG tablet, Take 1 tablet (100 mg total) by mouth daily., Disp: 90 tablet, Rfl: 0 .  temazepam (RESTORIL) 30 MG capsule, Take 1 capsule (30 mg total) by mouth daily., Disp: 90 capsule, Rfl: 0 .  triamcinolone ointment (KENALOG) 0.5 %, Apply 1 application topically 2 (two) times daily., Disp: 60 g, Rfl: 0 .  valACYclovir (VALTREX) 1000 MG tablet, Take 1 tablet (1,000 mg total) by mouth 2 (two) times daily. Prn, Disp: 60 tablet, Rfl: 0 .  valsartan-hydrochlorothiazide (DIOVAN-HCT) 160-12.5 MG tablet, TAKE 1 TABLET BY MOUTH  DAILY, Disp: 90 tablet, Rfl: 1  Allergies  Allergen Reactions  . Ace Inhibitors     cough  . Bee Venom     Bee  . Dapagliflozin     hematuria    I personally reviewed active problem list, medication list, allergies, family history, social history with the patient/caregiver today.   ROS  Constitutional: Negative for fever , negative  for weight change.  Respiratory: Negative for cough and shortness of breath.   Cardiovascular: Negative for chest pain or palpitations.  Gastrointestinal: Negative for abdominal pain, no bowel changes.  Musculoskeletal: Negative for gait problem or joint swelling.  Skin: Negative for rash.  Neurological: Negative for dizziness or headache.  No other specific complaints in a complete review of systems (except as listed in HPI above).  Objective  Vitals:   04/30/19 0844  BP: 110/64  Resp: 16  Weight: 198 lb (89.8 kg)  Height:  (1.702 m)    Body mass index is 31.01 kg/m.  Physical Exam  Constitutional: Patient appears well-developed and well-nourished. Obese  No distress.  HEENT: head atraumatic, normocephalic, pupils equal and reactive to light Cardiovascular: Normal rate, regular rhythm and normal heart sounds.  No murmur heard. No BLE edema. Pulmonary/Chest: Effort normal and breath sounds normal. No respiratory distress. Abdominal: Soft.  There is  no  tenderness. Psychiatric: Patient has a normal mood and affect. behavior is normal. Judgment and thought content normal.  Recent Results (from the past 2160 hour(s))  POCT HgB A1C     Status: Normal   Collection Time: 04/30/19  8:50 AM  Result Value Ref Range   Hemoglobin A1C     HbA1c POC (<> result, manual entry)     HbA1c, POC (prediabetic range)     HbA1c, POC (controlled diabetic range) 6.5 0.0 - 7.0 %    Diabetic Foot Exam: Diabetic Foot Exam - Simple   Simple Foot Form Visual Inspection See comments: Yes Sensation Testing Intact to touch and monofilament testing bilaterally: Yes Pulse Check Posterior Tibialis and Dorsalis pulse intact bilaterally: Yes Comments Corn on the bottom of left foot       PHQ2/9: Depression screen [facility] 2/9 04/30/2019 01/27/2019 10/06/2018 06/29/2018 03/23/2018  Decreased Interest 1 1 0 0 0  Down, Depressed, Hopeless 0 1 0 0 0  PHQ - 2 Score 1 2 0 0 0  Altered sleeping 0 3 3 0 0  Tired, decreased energy 1 0 0 0 1  Change in appetite 0 1 1 0 1  Feeling bad or failure about yourself  0 0 0 0 0  Trouble concentrating 0 0 0 0 0  Moving slowly or fidgety/restless 0 0 0 0 0  Suicidal thoughts 0 0 0 0 0  PHQ-9 Score 2 6 4  0 2  Difficult doing work/chores Not difficult at all Not difficult at all Not difficult at all Not difficult at all Not difficult at all  Some recent data might be hidden    phq 9 is positive  Fall Risk: Fall Risk  04/30/2019 01/27/2019 06/29/2018 03/23/2018 12/19/2017  Falls in the past year? - 0 0 No No  Number falls in past yr: 0 0 - - -  Injury with Fall? 0 0 - - -    Functional Status Survey: Is the patient deaf or have difficulty hearing?: No Does the patient have difficulty seeing, even when wearing glasses/contacts?: No Does the patient have difficulty concentrating, remembering, or making decisions?: No Does the patient have difficulty walking or climbing stairs?: No Does the patient have difficulty dressing or bathing?:  No Does the patient have difficulty doing errands alone such as visiting a doctor's office or shopping?: No    Assessment & Plan  1. Controlled type 2 diabetes with neuropathy (HCC)  - POCT HgB A1C - Microalbumin / creatinine urine ratio  2. Need for immunization against influenza  - Flu Vaccine QUAD 36+ mos IM  3. Dyslipidemia  - Lipid panel  4. Benign hypertension  - CBC with Differential/Platelet - Comprehensive metabolic panel  5. Mild recurrent major depression (HCC)  Continue medication   6. OSA on CPAP  Doing better with compliance  7. Morbid obesity (HCC)  Discussed with the patient the risk posed by an increased BMI. Discussed importance of portion control, calorie counting and at least 150 minutes of physical activity weekly. Avoid sweet beverages and drink more water. Eat at least 6 servings of fruit and vegetables daily   8. Vitamin D deficiency  - VITAMIN D 25 Hydroxy (Vit-D Deficiency, Fractures)  9. Gastroesophageal reflux disease without esophagitis   10. Dyslipidemia associated with type 2 diabetes mellitus (HCC)  - Lipid panel  11. Diabetic polyneuropathy associated with type 2 diabetes mellitus (HCC)  - metFORMIN (GLUCOPHAGE-XR) 750 MG 24 hr tablet; Take 2 tablets (1,500 mg total)  by mouth daily with breakfast.  Dispense: 180 tablet; Refill: 1 - Semaglutide, 1 MG/DOSE, (OZEMPIC, 1 MG/DOSE,) 2 MG/1.5ML SOPN; Inject 1 mg into the skin once a week.  Dispense: 9 mL; Refill: 1  12. Seasonal allergic rhinitis, unspecified trigger  - montelukast (SINGULAIR) 10 MG tablet; Take 1 tablet (10 mg total) by mouth at bedtime.  Dispense: 90 tablet; Refill: 1  13. Menopausal symptom  - sertraline (ZOLOFT) 100 MG tablet; Take 1 tablet (100 mg total) by mouth daily.  Dispense: 90 tablet; Refill: 0  14. Insomnia, persistent  - temazepam (RESTORIL) 30 MG capsule; Take 1 capsule (30 mg total) by mouth daily.  Dispense: 90 capsule; Refill: 0

## 2019-04-30 NOTE — Progress Notes (Signed)
p 

## 2019-05-12 ENCOUNTER — Other Ambulatory Visit: Payer: Self-pay | Admitting: Family Medicine

## 2019-05-12 DIAGNOSIS — Z1231 Encounter for screening mammogram for malignant neoplasm of breast: Secondary | ICD-10-CM

## 2019-05-21 ENCOUNTER — Telehealth: Payer: Self-pay | Admitting: Family Medicine

## 2019-05-21 NOTE — Telephone Encounter (Signed)
10 day supply of temazepam 30 mg was called in to patient's local pharmacy on 05/21/2019 @ 4:20pm.

## 2019-05-21 NOTE — Telephone Encounter (Signed)
Notifying the office for the temazepam (RESTORIL) 30 MG capsule will sent the pt 10 day supply while order is in transit.   They will need approval for full amount and please sent and state it is fora replacement.

## 2019-05-23 ENCOUNTER — Other Ambulatory Visit: Payer: Self-pay | Admitting: Family Medicine

## 2019-05-23 DIAGNOSIS — E1142 Type 2 diabetes mellitus with diabetic polyneuropathy: Secondary | ICD-10-CM

## 2019-05-28 ENCOUNTER — Other Ambulatory Visit: Payer: Self-pay | Admitting: Family Medicine

## 2019-05-28 DIAGNOSIS — G4700 Insomnia, unspecified: Secondary | ICD-10-CM

## 2019-05-28 NOTE — Telephone Encounter (Signed)
Medication Refill - Medication: temazepam (RESTORIL) 30 MG capsule  Has the patient contacted their pharmacy? Yes - pt's shipment is completely lost in [place].  She has already gotten a 10 day emergency supply.  Pt states that when she contacted the mail order they told her to send another RX out they have to have a brand new script. (Agent: If no, request that the patient contact the pharmacy for the refill.) (Agent: If yes, when and what did the pharmacy advise?)  Preferred Pharmacy (with phone number or street name):  [facility] [place] [phone] (Phone) [phone] (Fax)   Agent: Please be advised that RX refills may take up to 3 business days. We ask that you follow-up with your pharmacy.

## 2019-06-02 ENCOUNTER — Ambulatory Visit
Admission: RE | Admit: 2019-06-02 | Discharge: 2019-06-02 | Disposition: A | Discharge status: Home / Self Care | Payer: Managed Care, Other (non HMO) | Source: Ambulatory Visit | Attending: Family Medicine | Admitting: Family Medicine

## 2019-06-02 DIAGNOSIS — Z1231 Encounter for screening mammogram for malignant neoplasm of breast: Secondary | ICD-10-CM | POA: Insufficient documentation

## 2019-06-02 NOTE — Telephone Encounter (Signed)
[facility] calling.  States that pt's shipment of Temazepam was lost through the [employer] and they will resend/replace - however they do have to notify PCP because of this.  [employer] now has a return to sender on the original RX if it is found.

## 2019-06-04 NOTE — Telephone Encounter (Signed)
Spoke with [facility] and since [employer] lost her prescription. They just needed a verbal ok from Dr. Ancil Boozer to fill another prescription in place of the one that was lost.

## 2019-06-20 ENCOUNTER — Other Ambulatory Visit: Payer: Self-pay | Admitting: Family Medicine

## 2019-06-20 DIAGNOSIS — N951 Menopausal and female climacteric states: Secondary | ICD-10-CM

## 2019-08-03 ENCOUNTER — Other Ambulatory Visit: Payer: Self-pay

## 2019-08-03 ENCOUNTER — Ambulatory Visit: Payer: Managed Care, Other (non HMO) | Admitting: Family Medicine

## 2019-08-03 ENCOUNTER — Encounter: Payer: Self-pay | Admitting: Family Medicine

## 2019-08-03 VITALS — BP 100/60 | HR 106 | Temp 96.9°F | Resp 16 | Ht 67.0 in | Wt 191.5 lb

## 2019-08-03 DIAGNOSIS — E1140 Type 2 diabetes mellitus with diabetic neuropathy, unspecified: Secondary | ICD-10-CM

## 2019-08-03 DIAGNOSIS — E785 Hyperlipidemia, unspecified: Secondary | ICD-10-CM | POA: Diagnosis not present

## 2019-08-03 DIAGNOSIS — E559 Vitamin D deficiency, unspecified: Secondary | ICD-10-CM

## 2019-08-03 DIAGNOSIS — N951 Menopausal and female climacteric states: Secondary | ICD-10-CM

## 2019-08-03 DIAGNOSIS — F325 Major depressive disorder, single episode, in full remission: Secondary | ICD-10-CM

## 2019-08-03 DIAGNOSIS — Z9989 Dependence on other enabling machines and devices: Secondary | ICD-10-CM

## 2019-08-03 DIAGNOSIS — G4733 Obstructive sleep apnea (adult) (pediatric): Secondary | ICD-10-CM

## 2019-08-03 DIAGNOSIS — I10 Essential (primary) hypertension: Secondary | ICD-10-CM | POA: Diagnosis not present

## 2019-08-03 LAB — POCT GLYCOSYLATED HEMOGLOBIN (HGB A1C): Hemoglobin A1C: 6.3 % — AB (ref 4.0–5.6)

## 2019-08-03 MED ORDER — VALSARTAN-HYDROCHLOROTHIAZIDE 80-12.5 MG PO TABS: 1.0000 | ORAL_TABLET | Freq: Every day | ORAL | 1 refills | Status: DC

## 2019-08-03 MED ORDER — ATORVASTATIN CALCIUM 40 MG PO TABS: 40.0000 mg | ORAL_TABLET | Freq: Every day | ORAL | 1 refills | Status: DC

## 2019-08-03 MED ORDER — SERTRALINE HCL 100 MG PO TABS: 100.0000 mg | ORAL_TABLET | Freq: Every day | ORAL | 1 refills | Status: DC

## 2019-08-03 NOTE — Progress Notes (Signed)
Name: Joyce Blackburn   MRN: [id number]    DOB: 11-29-1960   Date:08/03/2019       Progress Note  Subjective  Chief Complaint  Chief Complaint  Patient presents with  . Diabetes  . Depression  . Hypertension  . Dyslipidemia  . Gastroesophageal Reflux    Symptoms are gradually worsening.    HPI  DMII: Shehas not beenchecking fsbson a regular basis.She denies polyphagia, polydipsia or polyuria. Started on Tresiba Fall 2016but is now on Toujeo 50 units  Metformin ( 1500 mg since last visit )and Ozempic1 gTaking ARB. She states that neuropathy pain has improved, only has occasional symptoms. Eye exam is up to date.LastHgbA1C 6.6%,7%,7.2%,7.3%,6.7%,6.6% , 6.5% and today 6.3% . She has not been checking glucose at home, she states only has hypoglycemic episodes when she eats smaller portions or not enough calories.   GERD: she also has a history of gastric ulcer over 20  years ago. She states occasionally still has nausea with certain smells, like grease. She was seen by Dr. Lars Pinks and had multiple labs done for evaluation of fatty liver, HIDA was normal. She is onPantoprazole ( Dexilant no longer covered), very seldom has diarrhea but usually triggered by fatty diet . She states heartburn is getting worse again, she has been having chocolate at night and will try to stop it   OSA:she states not wearing CPAP lately but she will resume it   Dyslipidemia: taking Lipitor and is tolerating it well. No myalgia or chest pain.She will have labs done today   HTN: bp is towards low end of normal, but denies pain, palpitation or dizziness   Vitamin D deficiency:she is taking supplements now, we will recheck labs   Menopause/major depressionmild: she states Zoloft 100 mg  and states hot flashes is still present but night sweats has improvedshe is doing well, seems to be in remission but wants to continue medication   Insomnia: she is taking Temazepam andshe has been  sleeping well on medication. No side effects of medication. Still works well for her   Morbid obesity: based on BMI and risk factors. [place] another 7 lbs since last visit.She states she is stopping when she feels full instead of over eating, also drinking more water, also drinking smoothies without sugar. She has lost 8 lbs since last visit   Patient Active Problem List   Diagnosis Date Noted  . Major depression in remission (HCC) 12/19/2017  . Fatty liver 03/04/2016  . Vitamin D deficiency 03/04/2016  . Gastroesophageal reflux disease without esophagitis 07/04/2015  . Diabetic polyneuropathy associated with type 2 diabetes mellitus (HCC) 03/03/2015  . Peripheral neuropathy 03/03/2015  . Allergic to bees 02/20/2015  . Benign hypertension 02/20/2015  . Insomnia, persistent 02/20/2015  . Chronic idiopathic constipation 02/20/2015  . Dyslipidemia 02/20/2015  . Dermatitis, eczematoid 02/20/2015  . H/O gastric ulcer 02/20/2015  . Herpes 02/20/2015  . Migraine without aura and responsive to treatment 02/20/2015  . Morbid obesity (HCC) 02/20/2015  . Obstructive apnea 02/20/2015  . Paresthesia of arm 02/20/2015  . Periodic limb movement 02/20/2015  . Allergic rhinitis, seasonal 02/20/2015  . Menopausal symptom 02/20/2015    Past Surgical History:  Procedure Laterality Date  . ABDOMINAL HYSTERECTOMY    . RETINAL TEAR REPAIR CRYOTHERAPY Right    [facility] (Dr. Robin Searing)    Family History  Problem Relation Age of Onset  . Mental illness Mother   . Diabetes Mother   . CVA Mother   . Schizophrenia  Mother   . Diabetes Sister   . Hypertension Sister   . Diabetes Brother   . Mental illness Brother   . Bipolar disorder Brother   . Schizophrenia Brother       Current Outpatient Medications:  .  aspirin 81 MG tablet, Take 81 mg by mouth daily., Disp: , Rfl:  .  atorvastatin (LIPITOR) 40 MG tablet, Take 1 tablet (40 mg total) by mouth daily., Disp: 90 tablet, Rfl: 1 .   EPINEPHRINE, ANAPHYLAXIS THERAPY AGENTS,, EPIPEN 2-PAK, 0.3MG /0.3ML (Injection Device)  1 Device use as directed for 30 days  Quantity: 2;  Refills: 1   Ordered :06-Mar-2012  Steele Sizer MD;  Marisa Sprinkles Active Comments: DX: 989.5, Disp: , Rfl:  .  Insulin Pen Needle (NOVOFINE) 32G X 6 MM MISC, 1 each by Does not apply route 2 (two) times daily., Disp: 100 each, Rfl: 1 .  Lancets (ACCU-CHEK SOFT TOUCH) lancets, Use as instructed, Disp: 100 each, Rfl: 12 .  loratadine (CLARITIN) 10 MG tablet, Take 1 tablet (10 mg total) by mouth daily., Disp: 30 tablet, Rfl: 11 .  meloxicam (MOBIC) 15 MG tablet, TAKE 1 TABLET BY MOUTH EVERY DAY (Patient taking differently: Take 15 mg by mouth as needed. ), Disp: 30 tablet, Rfl: 0 .  metaxalone (SKELAXIN) 800 MG tablet, Take 1 tablet (800 mg total) by mouth 3 (three) times daily., Disp: 90 tablet, Rfl: 0 .  metFORMIN (GLUCOPHAGE-XR) 750 MG 24 hr tablet, Take 2 tablets (1,500 mg total) by mouth daily with breakfast., Disp: 180 tablet, Rfl: 1 .  montelukast (SINGULAIR) 10 MG tablet, Take 1 tablet (10 mg total) by mouth at bedtime., Disp: 90 tablet, Rfl: 1 .  ONE TOUCH LANCETS MISC, 1 each by Does not apply route 2 (two) times daily., Disp: 100 each, Rfl: 1 .  ONE TOUCH ULTRA TEST test strip, USE AS INSTRUCTED TWO TIMES DAILY, Disp: 200 each, Rfl: 2 .  pantoprazole (PROTONIX) 40 MG tablet, TAKE 1 TABLET BY MOUTH  DAILY IN PLACE OF DEXILANT, Disp: 90 tablet, Rfl: 3 .  Semaglutide, 1 MG/DOSE, (OZEMPIC, 1 MG/DOSE,) 2 MG/1.5ML SOPN, Inject 1 mg into the skin once a week., Disp: 9 mL, Rfl: 1 .  sertraline (ZOLOFT) 100 MG tablet, TAKE 1 TABLET BY MOUTH  DAILY, Disp: 90 tablet, Rfl: 0 .  temazepam (RESTORIL) 30 MG capsule, Take 1 capsule (30 mg total) by mouth daily., Disp: 90 capsule, Rfl: 0 .  TOUJEO SOLOSTAR 300 UNIT/ML SOPN, INJECT 50 UNITS INTO THE  SKIN DAILY., Disp: 18 mL, Rfl: 1 .  triamcinolone ointment (KENALOG) 0.5 %, Apply 1 application topically 2 (two)  times daily., Disp: 60 g, Rfl: 0 .  valACYclovir (VALTREX) 1000 MG tablet, Take 1 tablet (1,000 mg total) by mouth 2 (two) times daily. Prn, Disp: 60 tablet, Rfl: 0 .  valsartan-hydrochlorothiazide (DIOVAN-HCT) 160-12.5 MG tablet, TAKE 1 TABLET BY MOUTH  DAILY, Disp: 90 tablet, Rfl: 1  Allergies  Allergen Reactions  . Ace Inhibitors     cough  . Bee Venom     Bee  . Dapagliflozin     hematuria    I personally reviewed active problem list, medication list, allergies, family history, social history, health maintenance with the patient/caregiver today.   ROS  Constitutional: Negative for fever , positive for  weight change.  Respiratory: Negative for cough and shortness of breath.   Cardiovascular: Negative for chest pain or palpitations.  Gastrointestinal: Negative for abdominal pain, no bowel changes.  Musculoskeletal:  Negative for gait problem or joint swelling.  Skin: Negative for rash.  Neurological: Negative for dizziness or headache.  No other specific complaints in a complete review of systems (except as listed in HPI above).  Objective   Vitals:   08/03/19 0916  BP: 100/60  Pulse: (!) 106  Resp: 16  Temp: (!) 96.9 F (36.1 C)  TempSrc: Temporal  SpO2: 97%  Weight: 191 lb 8 oz (86.9 kg)  Height: 5\' 7"  (1.702 m)    Body mass index is 29.99 kg/m.  Physical Exam  Constitutional: Patient appears well-developed and well-nourished. Obese  No distress.  HEENT: head atraumatic, normocephalic, pupils equal and reactive to light Cardiovascular: Normal rate, regular rhythm and normal heart sounds.  No murmur heard. No BLE edema. Pulmonary/Chest: Effort normal and breath sounds normal. No respiratory distress. Abdominal: Soft.  There is no tenderness. Psychiatric: Patient has a normal mood and affect. behavior is normal. Judgment and thought content normal.  PHQ2/9: Depression screen [facility] 2/9 08/03/2019 04/30/2019 01/27/2019 10/06/2018 06/29/2018  Decreased Interest 0 1 1 0  0  Down, Depressed, Hopeless 0 0 1 0 0  PHQ - 2 Score 0 1 2 0 0  Altered sleeping 0 0 3 3 0  Tired, decreased energy 0 1 0 0 0  Change in appetite 0 0 1 1 0  Feeling bad or failure about yourself  0 0 0 0 0  Trouble concentrating 0 0 0 0 0  Moving slowly or fidgety/restless 0 0 0 0 0  Suicidal thoughts 0 0 0 0 0  PHQ-9 Score 0 2 6 4  0  Difficult doing work/chores - Not difficult at all Not difficult at all Not difficult at all Not difficult at all  Some recent data might be hidden    phq 9 is negative   Fall Risk: Fall Risk  08/03/2019 04/30/2019 01/27/2019 06/29/2018 03/23/2018  Falls in the past year? 0 - 0 0 No  Number falls in past yr: 0 0 0 - -  Injury with Fall? 0 0 0 - -     Functional Status Survey: Is the patient deaf or have difficulty hearing?: No Does the patient have difficulty seeing, even when wearing glasses/contacts?: No Does the patient have difficulty concentrating, remembering, or making decisions?: No Does the patient have difficulty walking or climbing stairs?: No Does the patient have difficulty dressing or bathing?: No Does the patient have difficulty doing errands alone such as visiting a doctor's office or shopping?: No    Assessment & Plan  1. Controlled type 2 diabetes with neuropathy (HCC)  - POCT HgB A1C  2. Dyslipidemia  - atorvastatin (LIPITOR) 40 MG tablet; Take 1 tablet (40 mg total) by mouth daily.  Dispense: 90 tablet; Refill: 1  3. OSA on CPAP  Resume CPAP   4. Benign hypertension  - valsartan-hydrochlorothiazide (DIOVAN HCT) 80-12.5 MG tablet; Take 1 tablet by mouth daily.  Dispense: 90 tablet; Refill: 1  5. Vitamin D deficiency   6. Menopausal symptom  - sertraline (ZOLOFT) 100 MG tablet; Take 1 tablet (100 mg total) by mouth daily.  Dispense: 90 tablet; Refill: 1   7. Major depression in remission (HCC)

## 2019-09-30 LAB — CBC WITH DIFFERENTIAL/PLATELET
Basophils Absolute: 0 10*3/uL (ref 0.0–0.2)
Basos: 0 %
EOS (ABSOLUTE): 0.1 10*3/uL (ref 0.0–0.4)
Eos: 2 %
Hematocrit: 37.1 % (ref 34.0–46.6)
Hemoglobin: 12.3 g/dL (ref 11.1–15.9)
Immature Grans (Abs): 0 10*3/uL (ref 0.0–0.1)
Immature Granulocytes: 0 %
Lymphocytes Absolute: 2 10*3/uL (ref 0.7–3.1)
Lymphs: 39 %
MCH: 27.3 pg (ref 26.6–33.0)
MCHC: 33.2 g/dL (ref 31.5–35.7)
MCV: 82 fL (ref 79–97)
Monocytes Absolute: 0.3 10*3/uL (ref 0.1–0.9)
Monocytes: 6 %
Neutrophils Absolute: 2.7 10*3/uL (ref 1.4–7.0)
Neutrophils: 53 %
Platelets: 265 10*3/uL (ref 150–450)
RBC: 4.5 x10E6/uL (ref 3.77–5.28)
RDW: 14.3 % (ref 11.7–15.4)
WBC: 5.1 10*3/uL (ref 3.4–10.8)

## 2019-09-30 LAB — COMPREHENSIVE METABOLIC PANEL
ALT: 17 IU/L (ref 0–32)
AST: 18 IU/L (ref 0–40)
Albumin/Globulin Ratio: 1.5 (ref 1.2–2.2)
Albumin: 4 g/dL (ref 3.8–4.9)
Alkaline Phosphatase: 113 IU/L (ref 39–117)
BUN/Creatinine Ratio: 11 (ref 9–23)
BUN: 9 mg/dL (ref 6–24)
Bilirubin Total: 0.3 mg/dL (ref 0.0–1.2)
CO2: 21 mmol/L (ref 20–29)
Calcium: 9 mg/dL (ref 8.7–10.2)
Chloride: 102 mmol/L (ref 96–106)
Creatinine, Ser: 0.82 mg/dL (ref 0.57–1.00)
GFR calc Af Amer: 92 mL/min/{1.73_m2} (ref >59)
GFR calc non Af Amer: 80 mL/min/{1.73_m2} (ref >59)
Globulin, Total: 2.6 g/dL (ref 1.5–4.5)
Glucose: 176 mg/dL — ABNORMAL HIGH (ref 65–99)
Potassium: 3.8 mmol/L (ref 3.5–5.2)
Sodium: 139 mmol/L (ref 134–144)
Total Protein: 6.6 g/dL (ref 6.0–8.5)

## 2019-09-30 LAB — LIPID PANEL
Chol/HDL Ratio: 3.1 ratio (ref 0.0–4.4)
Cholesterol, Total: 120 mg/dL (ref 100–199)
HDL: 39 mg/dL — ABNORMAL LOW (ref >39)
LDL Chol Calc (NIH): 61 mg/dL (ref 0–99)
Triglycerides: 108 mg/dL (ref 0–149)
VLDL Cholesterol Cal: 20 mg/dL (ref 5–40)

## 2019-09-30 LAB — MICROALBUMIN / CREATININE URINE RATIO
Creatinine, Urine: 222.4 mg/dL
Microalb/Creat Ratio: 3 mg/g creat (ref 0–29)
Microalbumin, Urine: 6.2 ug/mL

## 2019-09-30 LAB — VITAMIN D 25 HYDROXY (VIT D DEFICIENCY, FRACTURES): Vit D, 25-Hydroxy: 9.3 ng/mL — ABNORMAL LOW (ref 30.0–100.0)

## 2019-10-03 ENCOUNTER — Other Ambulatory Visit: Payer: Self-pay | Admitting: Family Medicine

## 2019-10-03 MED ORDER — VITAMIN D (ERGOCALCIFEROL) 1.25 MG (50000 UNIT) PO CAPS: 50000.0000 [IU] | ORAL_CAPSULE | ORAL | 0 refills | Status: DC

## 2019-10-16 ENCOUNTER — Other Ambulatory Visit: Payer: Self-pay | Admitting: Family Medicine

## 2019-10-16 DIAGNOSIS — J302 Other seasonal allergic rhinitis: Secondary | ICD-10-CM

## 2019-10-16 NOTE — Telephone Encounter (Signed)
Requested Prescriptions  Pending Prescriptions Disp Refills  . montelukast (SINGULAIR) 10 MG tablet [Pharmacy Med Name: MONTELUKAST 10MG  TABLET] 90 tablet 3    Sig: TAKE 1 TABLET BY MOUTH AT  BEDTIME     Pulmonology:  Leukotriene Inhibitors Passed - 10/16/2019  9:12 PM      Passed - Valid encounter within last 12 months    Recent Outpatient Visits          2 months ago Controlled type 2 diabetes with neuropathy [facility])   [facility], MD   5 months ago Controlled type 2 diabetes with neuropathy [facility])   [facility] [place], [place]   8 months ago Controlled type 2 diabetes with neuropathy [facility])   [facility], MD   1 year ago Controlled type 2 diabetes with neuropathy [facility])   [facility], MD   1 year ago Diabetic polyneuropathy associated with type 2 diabetes mellitus [facility])   [facility], MD      Future Appointments            In 2 weeks Alba Cory, MD [facility]

## 2019-11-02 ENCOUNTER — Encounter: Payer: Self-pay | Admitting: Family Medicine

## 2019-11-02 ENCOUNTER — Ambulatory Visit: Payer: Managed Care, Other (non HMO) | Admitting: Family Medicine

## 2019-11-02 ENCOUNTER — Other Ambulatory Visit: Payer: Self-pay

## 2019-11-02 VITALS — BP 100/62 | HR 78 | Temp 96.8°F | Resp 16 | Ht 67.0 in | Wt 190.7 lb

## 2019-11-02 DIAGNOSIS — E559 Vitamin D deficiency, unspecified: Secondary | ICD-10-CM

## 2019-11-02 DIAGNOSIS — E1140 Type 2 diabetes mellitus with diabetic neuropathy, unspecified: Secondary | ICD-10-CM | POA: Diagnosis not present

## 2019-11-02 DIAGNOSIS — G4700 Insomnia, unspecified: Secondary | ICD-10-CM | POA: Diagnosis not present

## 2019-11-02 DIAGNOSIS — N951 Menopausal and female climacteric states: Secondary | ICD-10-CM

## 2019-11-02 DIAGNOSIS — E1142 Type 2 diabetes mellitus with diabetic polyneuropathy: Secondary | ICD-10-CM | POA: Diagnosis not present

## 2019-11-02 DIAGNOSIS — I152 Hypertension secondary to endocrine disorders: Secondary | ICD-10-CM

## 2019-11-02 DIAGNOSIS — I10 Essential (primary) hypertension: Secondary | ICD-10-CM

## 2019-11-02 DIAGNOSIS — E785 Hyperlipidemia, unspecified: Secondary | ICD-10-CM

## 2019-11-02 DIAGNOSIS — K219 Gastro-esophageal reflux disease without esophagitis: Secondary | ICD-10-CM

## 2019-11-02 DIAGNOSIS — E1159 Type 2 diabetes mellitus with other circulatory complications: Secondary | ICD-10-CM

## 2019-11-02 DIAGNOSIS — Z9989 Dependence on other enabling machines and devices: Secondary | ICD-10-CM

## 2019-11-02 DIAGNOSIS — G4733 Obstructive sleep apnea (adult) (pediatric): Secondary | ICD-10-CM

## 2019-11-02 LAB — POCT GLYCOSYLATED HEMOGLOBIN (HGB A1C): Hemoglobin A1C: 6.3 % — AB (ref 4.0–5.6)

## 2019-11-02 MED ORDER — ATORVASTATIN CALCIUM 40 MG PO TABS: 40.0000 mg | ORAL_TABLET | Freq: Every day | ORAL | 1 refills | Status: DC

## 2019-11-02 MED ORDER — OZEMPIC (1 MG/DOSE) 2 MG/1.5ML ~~LOC~~ SOPN: 1.0000 mg | PEN_INJECTOR | SUBCUTANEOUS | 1 refills | Status: DC

## 2019-11-02 MED ORDER — VALSARTAN 80 MG PO TABS: 80.0000 mg | ORAL_TABLET | Freq: Every day | ORAL | 0 refills | Status: DC

## 2019-11-02 MED ORDER — METFORMIN HCL ER 750 MG PO TB24: 1500.0000 mg | ORAL_TABLET | Freq: Every day | ORAL | 1 refills | Status: DC

## 2019-11-02 MED ORDER — VITAMIN D (ERGOCALCIFEROL) 1.25 MG (50000 UNIT) PO CAPS: 50000.0000 [IU] | ORAL_CAPSULE | ORAL | 3 refills | Status: DC

## 2019-11-02 MED ORDER — TEMAZEPAM 30 MG PO CAPS: 30.0000 mg | ORAL_CAPSULE | Freq: Every day | ORAL | 0 refills | Status: DC

## 2019-11-02 MED ORDER — SERTRALINE HCL 100 MG PO TABS: 100.0000 mg | ORAL_TABLET | Freq: Every day | ORAL | 1 refills | Status: DC

## 2019-11-02 NOTE — Progress Notes (Addendum)
Name: Joyce Blackburn   MRN: [id number]    DOB: 10-23-1960   Date:11/02/2019       Progress Note  Subjective  Chief Complaint  Chief Complaint  Patient presents with  . Diabetes  . Depression  . Dyslipidemia  . Hypertension  . Gastroesophageal Reflux    HPI  DMII: She denies polyphagia, polydipsia or polyuria.  She states that neuropathy pain has improved, only has occasional symptoms. Eye exam is up to date.A1C  6.6%,7%,7.2%,7.3%,6.7%,6.6%, 6.5% ,6.3% and again at 6.3%  . She has not been checking glucose at home, she has only been using Toujeo a couple of times a week, Ozempic every 2 weeks or so, but takes Metformin daily Advised to stop toujeo, use Ozempic as prescribed and continue Metformin and we will recheck A1C in 3 months She is on ARB  GERD: she also has a history of gastric ulcerover 20years ago. She states occasionally still has nausea with certain smells, like grease. She was seen by Dr. Durwin Reges and had multiple labs done for evaluation of fatty liver, HIDA was normal. She is onPantoprazole ( Dexilant no longer covered), very seldom has diarrhea but usually triggered by fatty diet . She states symptoms are unchanged  OSA:she states not wearing CPAP lately , she states she will get the cleaning kit for her supplies   Dyslipidemia: taking Lipitor and is tolerating it well. No myalgia or chest pain.Last HDL was low, discussed eating more tree nuts and exercising more   HTN: bp is towards low end of normal, but denies pain, palpitation or dizziness We will change from diovan hctz 80/12.5 to diovan 80 mg.   Vitamin D deficiency:continue rx vitamin D weekly.   Menopause/major depressionmild: she states Zoloft100 mgand states hot flashes is still present but night sweats has improvedshe is doing well, seems to be in remission but wants to continue medication Unchanged   Insomnia: she is taking Temazepam andshe has been sleeping well on medication. No side  effects of medication. She takes it every night otherwise she cannot stay asleep.   Morbid obesity: based on BMI and risk factors. [place] another 1 lbs since last visit.She states she is stopping when she feels full instead of over eating, also drinking more water, also drinking smoothies without sugar. Packing snacks for work    Patient Active Problem List   Diagnosis Date Noted  . Major depression in remission ([place]) 12/19/2017  . Fatty liver 03/04/2016  . Vitamin D deficiency 03/04/2016  . Gastroesophageal reflux disease without esophagitis 07/04/2015  . Diabetic polyneuropathy associated with type 2 diabetes mellitus ([place]) 03/03/2015  . Peripheral neuropathy 03/03/2015  . Allergic to bees 02/20/2015  . Benign hypertension 02/20/2015  . Insomnia, persistent 02/20/2015  . Chronic idiopathic constipation 02/20/2015  . Dyslipidemia 02/20/2015  . Dermatitis, eczematoid 02/20/2015  . H/O gastric ulcer 02/20/2015  . Herpes 02/20/2015  . Migraine without aura and responsive to treatment 02/20/2015  . Morbid obesity ([place]) 02/20/2015  . Obstructive apnea 02/20/2015  . Paresthesia of arm 02/20/2015  . Periodic limb movement 02/20/2015  . Allergic rhinitis, seasonal 02/20/2015  . Menopausal symptom 02/20/2015    Past Surgical History:  Procedure Laterality Date  . ABDOMINAL HYSTERECTOMY    . RETINAL TEAR REPAIR CRYOTHERAPY Right    [facility] (Dr. Noel Journey)    Family History  Problem Relation Age of Onset  . Mental illness Mother   . Diabetes Mother   . CVA Mother   . Schizophrenia Mother   .  Diabetes Sister   . Hypertension Sister   . Diabetes Brother   . Mental illness Brother   . Bipolar disorder Brother   . Schizophrenia Brother     Social History   Tobacco Use  . Smoking status: Former Smoker    Packs/day: 0.10    Years: 15.00    Pack years: 1.50    Quit date: 07/29/2004    Years since quitting: 15.2  . Smokeless tobacco: Never Used  Substance Use Topics   . Alcohol use: No    Alcohol/week: 0.0 standard drinks     Current Outpatient Medications:  .  aspirin 81 MG tablet, Take 81 mg by mouth daily., Disp: , Rfl:  .  atorvastatin (LIPITOR) 40 MG tablet, Take 1 tablet (40 mg total) by mouth daily., Disp: 90 tablet, Rfl: 1 .  EPINEPHRINE, ANAPHYLAXIS THERAPY AGENTS,, EPIPEN 2-PAK, 0.3MG/0.3ML (Injection Device)  1 Device use as directed for 30 days  Quantity: 2;  Refills: 1   Ordered :06-Mar-2012  Steele Sizer MD;  Marisa Sprinkles Active Comments: DX: 989.5, Disp: , Rfl:  .  Insulin Pen Needle (NOVOFINE) 32G X 6 MM MISC, 1 each by Does not apply route 2 (two) times daily., Disp: 100 each, Rfl: 1 .  Lancets (ACCU-CHEK SOFT TOUCH) lancets, Use as instructed, Disp: 100 each, Rfl: 12 .  loratadine (CLARITIN) 10 MG tablet, Take 1 tablet (10 mg total) by mouth daily., Disp: 30 tablet, Rfl: 11 .  meloxicam (MOBIC) 15 MG tablet, TAKE 1 TABLET BY MOUTH EVERY DAY (Patient taking differently: Take 15 mg by mouth as needed. ), Disp: 30 tablet, Rfl: 0 .  metaxalone (SKELAXIN) 800 MG tablet, Take 1 tablet (800 mg total) by mouth 3 (three) times daily., Disp: 90 tablet, Rfl: 0 .  metFORMIN (GLUCOPHAGE-XR) 750 MG 24 hr tablet, Take 2 tablets (1,500 mg total) by mouth daily with breakfast., Disp: 180 tablet, Rfl: 1 .  montelukast (SINGULAIR) 10 MG tablet, TAKE 1 TABLET BY MOUTH AT  BEDTIME, Disp: 90 tablet, Rfl: 3 .  ONE TOUCH LANCETS MISC, 1 each by Does not apply route 2 (two) times daily., Disp: 100 each, Rfl: 1 .  ONE TOUCH ULTRA TEST test strip, USE AS INSTRUCTED TWO TIMES DAILY, Disp: 200 each, Rfl: 2 .  pantoprazole (PROTONIX) 40 MG tablet, TAKE 1 TABLET BY MOUTH  DAILY IN PLACE OF DEXILANT, Disp: 90 tablet, Rfl: 3 .  Semaglutide, 1 MG/DOSE, (OZEMPIC, 1 MG/DOSE,) 2 MG/1.5ML SOPN, Inject 1 mg into the skin once a week., Disp: 9 mL, Rfl: 1 .  sertraline (ZOLOFT) 100 MG tablet, Take 1 tablet (100 mg total) by mouth daily., Disp: 90 tablet, Rfl: 1 .   temazepam (RESTORIL) 30 MG capsule, Take 1 capsule (30 mg total) by mouth daily., Disp: 90 capsule, Rfl: 0 .  triamcinolone ointment (KENALOG) 0.5 %, Apply 1 application topically 2 (two) times daily., Disp: 60 g, Rfl: 0 .  valACYclovir (VALTREX) 1000 MG tablet, Take 1 tablet (1,000 mg total) by mouth 2 (two) times daily. Prn, Disp: 60 tablet, Rfl: 0 .  Vitamin D, Ergocalciferol, (DRISDOL) 1.25 MG (50000 UNIT) CAPS capsule, Take 1 capsule (50,000 Units total) by mouth every 7 (seven) days., Disp: 12 capsule, Rfl: 3 .  valsartan (DIOVAN) 80 MG tablet, Take 1 tablet (80 mg total) by mouth daily., Disp: 90 tablet, Rfl: 0  Allergies  Allergen Reactions  . Ace Inhibitors     cough  . Bee Venom     Bee  .  Dapagliflozin     hematuria    I personally reviewed active problem list, medication list, allergies, family history, social history, health maintenance with the patient/caregiver today.   ROS  Constitutional: Negative for fever or weight change.  Respiratory: Negative for cough and shortness of breath.   Cardiovascular: Negative for chest pain or palpitations.  Gastrointestinal: Negative for abdominal pain, no bowel changes.  Musculoskeletal: Negative for gait problem or joint swelling.  Skin: Negative for rash.  Neurological: Negative for dizziness or headache.  No other specific complaints in a complete review of systems (except as listed in HPI above).  Objective  Vitals:   11/02/19 0809  BP: 100/62  Pulse: 78  Resp: 16  Temp: (!) 96.8 F (36 C)  TempSrc: Temporal  SpO2: 96%  Weight: 190 lb 11.2 oz (86.5 kg)  Height: '5\' 7"'  (1.702 m)    Body mass index is 29.87 kg/m.  Physical Exam   Constitutional: Patient appears well-developed and well-nourished. Obese  No distress.  HEENT: head atraumatic, normocephalic, pupils equal and reactive to light Cardiovascular: Normal rate, regular rhythm and normal heart sounds.  No murmur heard. No BLE edema. Pulmonary/Chest: Effort  normal and breath sounds normal. No respiratory distress. Abdominal: Soft.  There is no tenderness. Psychiatric: Patient has a normal mood and affect. behavior is normal. Judgment and thought content normal.  Recent Results (from the past 2160 hour(s))  Lipid panel     Status: Abnormal   Collection Time: 09/29/19  1:03 PM  Result Value Ref Range   Cholesterol, Total 120 100 - 199 mg/dL   Triglycerides 108 0 - 149 mg/dL   HDL 39 (L) >39 mg/dL   VLDL Cholesterol Cal 20 5 - 40 mg/dL   LDL Chol Calc (NIH) 61 0 - 99 mg/dL   Chol/HDL Ratio 3.1 0.0 - 4.4 ratio    Comment:                                   T. Chol/HDL Ratio                                             Men  Women                               1/2 Avg.Risk  3.4    3.3                                   Avg.Risk  5.0    4.4                                2X Avg.Risk  9.6    7.1                                3X Avg.Risk 23.4   11.0   CBC with Differential/Platelet     Status: None   Collection Time: 09/29/19  1:03 PM  Result Value Ref Range   WBC 5.1 3.4 - 10.8 x10E3/uL   RBC 4.50 3.77 - 5.28 x10E6/uL  Hemoglobin 12.3 11.1 - 15.9 g/dL   Hematocrit 37.1 34.0 - 46.6 %   MCV 82 79 - 97 fL   MCH 27.3 26.6 - 33.0 pg   MCHC 33.2 31.5 - 35.7 g/dL   RDW 14.3 11.7 - 15.4 %   Platelets 265 150 - 450 x10E3/uL   Neutrophils 53 Not Estab. %   Lymphs 39 Not Estab. %   Monocytes 6 Not Estab. %   Eos 2 Not Estab. %   Basos 0 Not Estab. %   Neutrophils Absolute 2.7 1.4 - 7.0 x10E3/uL   Lymphocytes Absolute 2.0 0.7 - 3.1 x10E3/uL   Monocytes Absolute 0.3 0.1 - 0.9 x10E3/uL   EOS (ABSOLUTE) 0.1 0.0 - 0.4 x10E3/uL   Basophils Absolute 0.0 0.0 - 0.2 x10E3/uL   Immature Granulocytes 0 Not Estab. %   Immature Grans (Abs) 0.0 0.0 - 0.1 x10E3/uL  Comprehensive metabolic panel     Status: Abnormal   Collection Time: 09/29/19  1:03 PM  Result Value Ref Range   Glucose 176 (H) 65 - 99 mg/dL   BUN 9 6 - 24 mg/dL   Creatinine, Ser 0.82 0.57  - 1.00 mg/dL   GFR calc non Af Amer 80 >59 mL/min/1.73   GFR calc Af Amer 92 >59 mL/min/1.73   BUN/Creatinine Ratio 11 9 - 23   Sodium 139 134 - 144 mmol/L   Potassium 3.8 3.5 - 5.2 mmol/L   Chloride 102 96 - 106 mmol/L   CO2 21 20 - 29 mmol/L   Calcium 9.0 8.7 - 10.2 mg/dL   Total Protein 6.6 6.0 - 8.5 g/dL   Albumin 4.0 3.8 - 4.9 g/dL   Globulin, Total 2.6 1.5 - 4.5 g/dL   Albumin/Globulin Ratio 1.5 1.2 - 2.2   Bilirubin Total 0.3 0.0 - 1.2 mg/dL   Alkaline Phosphatase 113 39 - 117 IU/L   AST 18 0 - 40 IU/L   ALT 17 0 - 32 IU/L  VITAMIN D 25 Hydroxy (Vit-D Deficiency, Fractures)     Status: Abnormal   Collection Time: 09/29/19  1:03 PM  Result Value Ref Range   Vit D, 25-Hydroxy 9.3 (L) 30.0 - 100.0 ng/mL    Comment: Vitamin D deficiency has been defined by the Institute of Medicine and an Endocrine Society practice guideline as a level of serum 25-OH vitamin D less than 20 ng/mL (1,2). The Endocrine Society went on to further define vitamin D insufficiency as a level between 21 and 29 ng/mL (2). 1. IOM (Institute of Medicine). 2010. Dietary reference    intakes for calcium and D. [place]: The    [employer]. 2. Holick MF, Binkley [place], Bischoff-Ferrari HA, et al.    Evaluation, treatment, and prevention of vitamin D    deficiency: an Endocrine Society clinical practice    guideline. JCEM. 2011 Jul; 96(7):1911-30.   Microalbumin / creatinine urine ratio     Status: None   Collection Time: 09/29/19  1:03 PM  Result Value Ref Range   Creatinine, Urine 222.4 Not Estab. mg/dL   Microalbumin, Urine 6.2 Not Estab. ug/mL   Microalb/Creat Ratio 3 0 - 29 mg/g creat    Comment:                        Normal:                0 -  29  Moderately increased: 30 - 300                        Severely increased:       >300   POCT HgB A1C     Status: Abnormal   Collection Time: 11/02/19  8:14 AM  Result Value Ref Range   Hemoglobin A1C 6.3 (A) 4.0  - 5.6 %   HbA1c POC (<> result, manual entry)     HbA1c, POC (prediabetic range)     HbA1c, POC (controlled diabetic range)        PHQ2/9: Depression screen [facility] 2/9 11/02/2019 08/03/2019 04/30/2019 01/27/2019 10/06/2018  Decreased Interest 0 '1 1 1 ' 0  Down, Depressed, Hopeless 0 0 0 1 0  PHQ - 2 Score 0 '1 1 2 ' 0  Altered sleeping 0 3 0 3 3  Tired, decreased energy 0 0 1 0 0  Change in appetite 0 0 0 1 1  Feeling bad or failure about yourself  0 0 0 0 0  Trouble concentrating 0 0 0 0 0  Moving slowly or fidgety/restless 0 0 0 0 0  Suicidal thoughts 0 0 0 0 0  PHQ-9 Score 0 '4 2 6 4  ' Difficult doing work/chores - Not difficult at all Not difficult at all Not difficult at all Not difficult at all  Some recent data might be hidden    phq 9 is negative   Fall Risk: Fall Risk  11/02/2019 08/03/2019 04/30/2019 01/27/2019 06/29/2018  Falls in the past year? 0 0 - 0 0  Number falls in past yr: 0 0 0 0 -  Injury with Fall? 0 0 0 0 -     Functional Status Survey: Is the patient deaf or have difficulty hearing?: No Does the patient have difficulty seeing, even when wearing glasses/contacts?: No Does the patient have difficulty concentrating, remembering, or making decisions?: No Does the patient have difficulty walking or climbing stairs?: No Does the patient have difficulty dressing or bathing?: No Does the patient have difficulty doing errands alone such as visiting a doctor's office or shopping?: No    Assessment & Plan  1. Controlled type 2 diabetes with neuropathy (HCC)  - POCT HgB A1C  2. Diabetic polyneuropathy associated with type 2 diabetes mellitus (HCC)  - metFORMIN (GLUCOPHAGE-XR) 750 MG 24 hr tablet; Take 2 tablets (1,500 mg total) by mouth daily with breakfast.  Dispense: 180 tablet; Refill: 1 - Semaglutide, 1 MG/DOSE, (OZEMPIC, 1 MG/DOSE,) 2 MG/1.5ML SOPN; Inject 1 mg into the skin once a week.  Dispense: 9 mL; Refill: 1  3. Insomnia, persistent  - temazepam (RESTORIL) 30 MG  capsule; Take 1 capsule (30 mg total) by mouth daily.  Dispense: 90 capsule; Refill: 0  4. Menopausal symptom  - sertraline (ZOLOFT) 100 MG tablet; Take 1 tablet (100 mg total) by mouth daily.  Dispense: 90 tablet; Refill: 1  5. Gastroesophageal reflux disease without esophagitis   6. Dyslipidemia  - atorvastatin (LIPITOR) 40 MG tablet; Take 1 tablet (40 mg total) by mouth daily.  Dispense: 90 tablet; Refill: 1  7. OSA on CPAP   8. Benign hypertension  - valsartan (DIOVAN) 80 MG tablet; Take 1 tablet (80 mg total) by mouth daily.  Dispense: 90 tablet; Refill: 0  9. Vitamin D deficiency  - Vitamin D, Ergocalciferol, (DRISDOL) 1.25 MG (50000 UNIT) CAPS capsule; Take 1 capsule (50,000 Units total) by mouth every 7 (seven) days.  Dispense: 12 capsule; Refill:  3  10. Morbid obesity ([place])  Discussed with the patient the risk posed by an increased BMI. Discussed importance of portion control, calorie counting and at least 150 minutes of physical activity weekly. Avoid sweet beverages and drink more water. Eat at least 6 servings of fruit and vegetables daily   11. Hypertension associated with diabetes ([place])  We will decrease dose since BP is low  - valsartan (DIOVAN) 80 MG tablet; Take 1 tablet (80 mg total) by mouth daily.  Dispense: 90 tablet; Refill: 0

## 2020-01-03 ENCOUNTER — Other Ambulatory Visit: Payer: Self-pay | Admitting: Family Medicine

## 2020-01-03 DIAGNOSIS — I10 Essential (primary) hypertension: Secondary | ICD-10-CM

## 2020-01-03 DIAGNOSIS — I152 Hypertension secondary to endocrine disorders: Secondary | ICD-10-CM

## 2020-01-20 ENCOUNTER — Encounter: Payer: Self-pay | Admitting: Family Medicine

## 2020-02-20 ENCOUNTER — Other Ambulatory Visit: Payer: Self-pay | Admitting: Family Medicine

## 2020-02-20 DIAGNOSIS — G4700 Insomnia, unspecified: Secondary | ICD-10-CM

## 2020-02-20 NOTE — Telephone Encounter (Signed)
Requested medication (s) are due for refill today: yes  Requested medication (s) are on the active medication list: yes  Last refill:  11/02/19  Future visit scheduled: no  Notes to clinic:  med not delegated to NT to RF   Requested Prescriptions  Pending Prescriptions Disp Refills   temazepam (RESTORIL) 30 MG capsule [Pharmacy Med Name: TEMAZEPAM  30MG   CAP] 90 capsule     Sig: TAKE 1 CAPSULE BY MOUTH  DAILY      Not Delegated - Psychiatry:  Anxiolytics/Hypnotics Failed - 02/20/2020  6:10 PM      Failed - This refill cannot be delegated      Failed - Urine Drug Screen completed in last 360 days.      Passed - Valid encounter within last 6 months    Recent Outpatient Visits           3 months ago Controlled type 2 diabetes with neuropathy [facility])   [facility] [place], MD   6 months ago Controlled type 2 diabetes with neuropathy [facility])   [facility], MD   9 months ago Controlled type 2 diabetes with neuropathy [facility])   [facility], MD   1 year ago Controlled type 2 diabetes with neuropathy [facility])   [facility], MD   1 year ago Controlled type 2 diabetes with neuropathy [facility])   [facility], MD       Future Appointments             In 2 weeks Alba Cory, MD [facility], [facility]

## 2020-03-06 ENCOUNTER — Encounter: Payer: Self-pay | Admitting: Family Medicine

## 2020-03-06 ENCOUNTER — Other Ambulatory Visit: Payer: Self-pay

## 2020-03-06 ENCOUNTER — Ambulatory Visit: Payer: Managed Care, Other (non HMO) | Admitting: Family Medicine

## 2020-03-06 VITALS — BP 122/86 | HR 95 | Temp 98.3°F | Resp 16 | Ht 67.0 in | Wt 193.8 lb

## 2020-03-06 DIAGNOSIS — R202 Paresthesia of skin: Secondary | ICD-10-CM

## 2020-03-06 DIAGNOSIS — E785 Hyperlipidemia, unspecified: Secondary | ICD-10-CM

## 2020-03-06 DIAGNOSIS — G4700 Insomnia, unspecified: Secondary | ICD-10-CM | POA: Diagnosis not present

## 2020-03-06 DIAGNOSIS — E1140 Type 2 diabetes mellitus with diabetic neuropathy, unspecified: Secondary | ICD-10-CM

## 2020-03-06 DIAGNOSIS — M79604 Pain in right leg: Secondary | ICD-10-CM

## 2020-03-06 DIAGNOSIS — M545 Low back pain, unspecified: Secondary | ICD-10-CM

## 2020-03-06 DIAGNOSIS — G4733 Obstructive sleep apnea (adult) (pediatric): Secondary | ICD-10-CM

## 2020-03-06 DIAGNOSIS — G43009 Migraine without aura, not intractable, without status migrainosus: Secondary | ICD-10-CM

## 2020-03-06 DIAGNOSIS — I10 Essential (primary) hypertension: Secondary | ICD-10-CM

## 2020-03-06 DIAGNOSIS — E559 Vitamin D deficiency, unspecified: Secondary | ICD-10-CM

## 2020-03-06 DIAGNOSIS — M25522 Pain in left elbow: Secondary | ICD-10-CM

## 2020-03-06 DIAGNOSIS — Z23 Encounter for immunization: Secondary | ICD-10-CM | POA: Diagnosis not present

## 2020-03-06 DIAGNOSIS — E1159 Type 2 diabetes mellitus with other circulatory complications: Secondary | ICD-10-CM

## 2020-03-06 DIAGNOSIS — I152 Hypertension secondary to endocrine disorders: Secondary | ICD-10-CM

## 2020-03-06 DIAGNOSIS — F330 Major depressive disorder, recurrent, mild: Secondary | ICD-10-CM

## 2020-03-06 DIAGNOSIS — Z1211 Encounter for screening for malignant neoplasm of colon: Secondary | ICD-10-CM

## 2020-03-06 DIAGNOSIS — Z9989 Dependence on other enabling machines and devices: Secondary | ICD-10-CM

## 2020-03-06 DIAGNOSIS — E1142 Type 2 diabetes mellitus with diabetic polyneuropathy: Secondary | ICD-10-CM | POA: Diagnosis not present

## 2020-03-06 DIAGNOSIS — K219 Gastro-esophageal reflux disease without esophagitis: Secondary | ICD-10-CM

## 2020-03-06 DIAGNOSIS — N951 Menopausal and female climacteric states: Secondary | ICD-10-CM

## 2020-03-06 LAB — POCT GLYCOSYLATED HEMOGLOBIN (HGB A1C): Hemoglobin A1C: 7.5 % — AB (ref 4.0–5.6)

## 2020-03-06 MED ORDER — ARIPIPRAZOLE 2 MG PO TABS: 2.0000 mg | ORAL_TABLET | Freq: Every day | ORAL | 1 refills | Status: DC

## 2020-03-06 MED ORDER — PANTOPRAZOLE SODIUM 40 MG PO TBEC: 40.0000 mg | DELAYED_RELEASE_TABLET | Freq: Every day | ORAL | 3 refills | Status: DC

## 2020-03-06 MED ORDER — OZEMPIC (0.25 OR 0.5 MG/DOSE) 2 MG/1.5ML ~~LOC~~ SOPN: 0.5000 mg | PEN_INJECTOR | SUBCUTANEOUS | 0 refills | Status: DC

## 2020-03-06 MED ORDER — SERTRALINE HCL 100 MG PO TABS: 100.0000 mg | ORAL_TABLET | Freq: Every day | ORAL | 1 refills | Status: DC

## 2020-03-06 MED ORDER — MELOXICAM 15 MG PO TABS: 15.0000 mg | ORAL_TABLET | ORAL | 0 refills | Status: DC | PRN

## 2020-03-06 MED ORDER — NURTEC 75 MG PO TBDP: 1.0000 | ORAL_TABLET | ORAL | 2 refills | Status: DC

## 2020-03-06 NOTE — Progress Notes (Signed)
Name: Joyce Blackburn   MRN: [id number]    DOB: 05-Nov-1960   Date:03/06/2020       Progress Note  Subjective  Chief Complaint  Chief Complaint  Patient presents with  . Follow-up  . Diabetes    HPI  DMII: Shehas not beenchecking fsbson a regular basis.She denies polyphagia, polydipsia or polyuria. Started on Tresiba Fall 2016but is now on Toujeo 50 units  Metformin, she stopped Ozempic because it caused a sulfa taste in her mouth and bloating. Taking ARB. She states that neuropathy pain has improved, only has occasional symptoms. Eye exam is up to date.LastHgbA1C 6.6%,7%,7.2%,7.3%,6.7%,6.6%, 6.5%, 6.3% . No recent hypoglycemic episodes   GERD: she also has a history of gastric ulcerover 20years ago. She states occasionally still has nausea with certain smells, like grease. She was seen by Dr. Lars Pinks and had multiple labs done for evaluation of fatty liver, HIDA was normal. She is onPantoprazole ( Dexilant no longer covered), very seldom has diarrhea but usually triggered by fatty diet . She was having more bloating on Ozempic 1 mg , we will decrease the dose and monitor   OSA:she resumed CPAP machine    Dyslipidemia: taking Lipitor and is tolerating it well. No myalgia or chest pain.LDL is at goal   HTN: bp is at goal,  but denies pain, palpitation or dizziness . Continue ARB  Vitamin D deficiency:level was low, continue supplementation and recheck next visit   Menopause/major depressionmild: she states Zoloft100 mgand states hot flashes is still present but night sweats has improvedshe is doing well, she states recently feeling more emotional. She has been getting upset with minor things at work, the anniversary of her husband's death in this 12-14-22. We will add Abilify   Insomnia: she is taking Temazepam andshe has been sleeping well on medication. She has rx at home   Morbid obesity: based on BMI and risk factors. She gained a few pounds since  last visit, but not taking Ozempic    Migraine headaches: she states that episodes has been more frequent over the past 2 months. Episodes are starting suddenly associated with nausea and vomiting, at least once a week that is very intense, and sometimes a dull ache.   Paresthesia left hand: she has noticed left elbow pain for the past months, tenderness on left elbow that she has been wearing an elbow brace, no weakness associated with symptoms. Tender to touch on left elbow  Patient Active Problem List   Diagnosis Date Noted  . Major depression in remission (HCC) 12/19/2017  . Fatty liver 03/04/2016  . Vitamin D deficiency 03/04/2016  . Gastroesophageal reflux disease without esophagitis 07/04/2015  . Diabetic polyneuropathy associated with type 2 diabetes mellitus (HCC) 03/03/2015  . Peripheral neuropathy 03/03/2015  . Allergic to bees 02/20/2015  . Benign hypertension 02/20/2015  . Insomnia, persistent 02/20/2015  . Chronic idiopathic constipation 02/20/2015  . Dyslipidemia 02/20/2015  . Dermatitis, eczematoid 02/20/2015  . H/O gastric ulcer 02/20/2015  . Herpes 02/20/2015  . Migraine without aura and responsive to treatment 02/20/2015  . Morbid obesity (HCC) 02/20/2015  . Obstructive apnea 02/20/2015  . Paresthesia of arm 02/20/2015  . Periodic limb movement 02/20/2015  . Allergic rhinitis, seasonal 02/20/2015  . Menopausal symptom 02/20/2015    Past Surgical History:  Procedure Laterality Date  . ABDOMINAL HYSTERECTOMY    . RETINAL TEAR REPAIR CRYOTHERAPY Right    [facility] (Dr. Robin Searing)    Family History  Problem Relation Age of Onset  .  Mental illness Mother   . Diabetes Mother   . CVA Mother   . Schizophrenia Mother   . Diabetes Sister   . Hypertension Sister   . Diabetes Brother   . Mental illness Brother   . Bipolar disorder Brother   . Schizophrenia Brother     Social History   Tobacco Use  . Smoking status: Former Smoker    Packs/day: 0.10     Years: 15.00    Pack years: 1.50    Quit date: 07/29/2004    Years since quitting: 15.6  . Smokeless tobacco: Never Used  Substance Use Topics  . Alcohol use: No    Alcohol/week: 0.0 standard drinks     Current Outpatient Medications:  .  aspirin 81 MG tablet, Take 81 mg by mouth daily., Disp: , Rfl:  .  atorvastatin (LIPITOR) 40 MG tablet, Take 1 tablet (40 mg total) by mouth daily., Disp: 90 tablet, Rfl: 1 .  EPINEPHRINE, ANAPHYLAXIS THERAPY AGENTS,, EPIPEN 2-PAK, 0.3MG /0.3ML (Injection Device)  1 Device use as directed for 30 days  Quantity: 2;  Refills: 1   Ordered :06-Mar-2012  Alba Cory MD;  Started 06-Mar-2012 Active Comments: DX: 989.5, Disp: , Rfl:  .  loratadine (CLARITIN) 10 MG tablet, Take 1 tablet (10 mg total) by mouth daily., Disp: 30 tablet, Rfl: 11 .  meloxicam (MOBIC) 15 MG tablet, Take 1 tablet (15 mg total) by mouth as needed., Disp: 90 tablet, Rfl: 0 .  metaxalone (SKELAXIN) 800 MG tablet, Take 1 tablet (800 mg total) by mouth 3 (three) times daily., Disp: 90 tablet, Rfl: 0 .  metFORMIN (GLUCOPHAGE-XR) 750 MG 24 hr tablet, Take 2 tablets (1,500 mg total) by mouth daily with breakfast., Disp: 180 tablet, Rfl: 1 .  montelukast (SINGULAIR) 10 MG tablet, TAKE 1 TABLET BY MOUTH AT  BEDTIME, Disp: 90 tablet, Rfl: 3 .  pantoprazole (PROTONIX) 40 MG tablet, Take 1 tablet (40 mg total) by mouth daily., Disp: 90 tablet, Rfl: 3 .  sertraline (ZOLOFT) 100 MG tablet, Take 1 tablet (100 mg total) by mouth daily., Disp: 90 tablet, Rfl: 1 .  temazepam (RESTORIL) 30 MG capsule, TAKE 1 CAPSULE BY MOUTH  DAILY, Disp: 90 capsule, Rfl: 0 .  triamcinolone ointment (KENALOG) 0.5 %, Apply 1 application topically 2 (two) times daily., Disp: 60 g, Rfl: 0 .  valACYclovir (VALTREX) 1000 MG tablet, Take 1 tablet (1,000 mg total) by mouth 2 (two) times daily. Prn, Disp: 60 tablet, Rfl: 0 .  valsartan (DIOVAN) 80 MG tablet, TAKE 1 TABLET BY MOUTH  DAILY, Disp: 90 tablet, Rfl: 1 .  Vitamin D,  Ergocalciferol, (DRISDOL) 1.25 MG (50000 UNIT) CAPS capsule, Take 1 capsule (50,000 Units total) by mouth every 7 (seven) days., Disp: 12 capsule, Rfl: 3 .  ARIPiprazole (ABILIFY) 2 MG tablet, Take 1 tablet (2 mg total) by mouth daily., Disp: 30 tablet, Rfl: 1 .  ONE TOUCH LANCETS MISC, 1 each by Does not apply route 2 (two) times daily. (Patient not taking: Reported on 03/06/2020), Disp: 100 each, Rfl: 1 .  ONE TOUCH ULTRA TEST test strip, USE AS INSTRUCTED TWO TIMES DAILY (Patient not taking: Reported on 03/06/2020), Disp: 200 each, Rfl: 2 .  Rimegepant Sulfate (NURTEC) 75 MG TBDP, Take 1 tablet by mouth every other day., Disp: 16 tablet, Rfl: 2 .  Semaglutide,0.25 or 0.5MG /DOS, (OZEMPIC, 0.25 OR 0.5 MG/DOSE,) 2 MG/1.5ML SOPN, Inject 0.375 mLs (0.5 mg total) into the skin once a week., Disp: 9 mL, Rfl: 0  Allergies  Allergen Reactions  . Ace Inhibitors     cough  . Bee Venom     Bee  . Dapagliflozin     hematuria    I personally reviewed active problem list, medication list, allergies, family history, social history, health maintenance with the patient/caregiver today.   ROS  Constitutional: Negative for fever or weight change.  Respiratory: Negative for cough and shortness of breath.   Cardiovascular: Negative for chest pain or palpitations.  Gastrointestinal: Negative for abdominal pain, no bowel changes.  Musculoskeletal: positive  for gait problem and sometimes left elbow  joint swelling.  Skin: Negative for rash.  Neurological: Negative for dizziness or headache.  No other specific complaints in a complete review of systems (except as listed in HPI above).  Objective   Vitals:   03/06/20 0831  BP: 122/86  Pulse: 95  Resp: 16  Temp: 98.3 F (36.8 C)  TempSrc: Oral  SpO2: 99%  Weight: 193 lb 12.8 oz (87.9 kg)  Height: 5\' 7"  (1.702 m)    Body mass index is 30.35 kg/m.  Physical Exam  Constitutional: Patient appears well-developed and well-nourished. Obese  No  distress.  HEENT: head atraumatic, normocephalic, pupils equal and reactive to light,  neck supple Cardiovascular: Normal rate, regular rhythm and normal heart sounds.  No murmur heard. No BLE edema. Pulmonary/Chest: Effort normal and breath sounds normal. No respiratory distress. Abdominal: Soft.  There is no tenderness. Psychiatric: Patient has a normal mood and affect. behavior is normal. Judgment and thought content normal.   Diabetic Foot Exam: Diabetic Foot Exam - Simple   Simple Foot Form Diabetic Foot exam was performed with the following findings: Yes 03/06/2020  9:10 AM  Visual Inspection See comments: Yes Sensation Testing Intact to touch and monofilament testing bilaterally: Yes Pulse Check Posterior Tibialis and Dorsalis pulse intact bilaterally: Yes Comments Dry skin on both feet       PHQ2/9: Depression screen [facility] [place] 2/9 03/06/2020 11/02/2019 08/03/2019 04/30/2019 01/27/2019  Decreased Interest 0 0 1 1 1   Down, Depressed, Hopeless 0 0 0 0 1  PHQ - 2 Score 0 0 1 1 2   Altered sleeping 0 0 3 0 3  Tired, decreased energy 0 0 0 1 0  Change in appetite 0 0 0 0 1  Feeling bad or failure about yourself  0 0 0 0 0  Trouble concentrating 0 0 0 0 0  Moving slowly or fidgety/restless 0 0 0 0 0  Suicidal thoughts 0 0 0 0 0  PHQ-9 Score 0 0 4 2 6   Difficult doing work/chores - - Not difficult at all Not difficult at all Not difficult at all  Some recent data might be hidden    phq 9 is negative  Fall Risk: Fall Risk  03/06/2020 11/02/2019 08/03/2019 04/30/2019 01/27/2019  Falls in the past year? 0 0 0 - 0  Number falls in past yr: 0 0 0 0 0  Injury with Fall? 0 0 0 0 0     Functional Status Survey: Is the patient deaf or have difficulty hearing?: No Does the patient have difficulty seeing, even when wearing glasses/contacts?: No Does the patient have difficulty concentrating, remembering, or making decisions?: No Does the patient have difficulty walking or climbing stairs?: No Does  the patient have difficulty dressing or bathing?: No Does the patient have difficulty doing errands alone such as visiting a doctor's office or shopping?: No    Assessment & Plan  1. Controlled type  2 diabetes with neuropathy (HCC)  - POCT HgB A1C  2. Diabetic polyneuropathy associated with type 2 diabetes mellitus (HCC)  She will try resuming lower dose of Ozempic since she had bloating and weird taste in her mouth with higher dose  3. Insomnia, persistent  On medication at night   4. Dyslipidemia  On statin   5. Vitamin D deficiency  Continue supplementation   6. Gastroesophageal reflux disease without esophagitis  - pantoprazole (PROTONIX) 40 MG tablet; Take 1 tablet (40 mg total) by mouth daily.  Dispense: 90 tablet; Refill: 3  7. Benign hypertension  At goal   8. Hypertension associated with diabetes (HCC)  Continue ARB  9. Obesity (HCC)  Discussed with the patient the risk posed by an increased BMI. Discussed importance of portion control, calorie counting and at least 150 minutes of physical activity weekly. Avoid sweet beverages and drink more water. Eat at least 6 servings of fruit and vegetables daily   10. OSA on CPAP   11. Colon cancer screening  Referral GI   12. Need for Tdap vaccination  - Tdap vaccine greater than or equal to 7yo IM  13. Left hand paresthesia  - Ambulatory referral to Orthopedic Surgery  14. Migraine without aura and without status migrainosus, not intractable  - Rimegepant Sulfate (NURTEC) 75 MG TBDP; Take 1 tablet by mouth every other day.  Dispense: 16 tablet; Refill: 2  15. Mild recurrent major depression (HCC)  She needs to have evaluation by psychologist  - ARIPiprazole (ABILIFY) 2 MG tablet; Take 1 tablet (2 mg total) by mouth daily.  Dispense: 30 tablet; Refill: 1  16. Pain in right leg  - Ambulatory referral to Orthopedic Surgery  17. Elbow pain, left  - Ambulatory referral to Orthopedic Surgery  18.  Intermittent low back pain  - meloxicam (MOBIC) 15 MG tablet; Take 1 tablet (15 mg total) by mouth as needed.  Dispense: 90 tablet; Refill: 0  19. Menopausal symptom  - sertraline (ZOLOFT) 100 MG tablet; Take 1 tablet (100 mg total) by mouth daily.  Dispense: 90 tablet; Refill: 1

## 2020-03-13 ENCOUNTER — Other Ambulatory Visit: Payer: Self-pay

## 2020-03-13 DIAGNOSIS — M545 Low back pain, unspecified: Secondary | ICD-10-CM

## 2020-03-13 MED ORDER — MELOXICAM 15 MG PO TABS: 15.0000 mg | ORAL_TABLET | ORAL | 0 refills | Status: DC | PRN

## 2020-03-14 ENCOUNTER — Other Ambulatory Visit: Payer: Self-pay

## 2020-03-14 ENCOUNTER — Telehealth (INDEPENDENT_AMBULATORY_CARE_PROVIDER_SITE_OTHER): Payer: Self-pay | Admitting: Gastroenterology

## 2020-03-14 DIAGNOSIS — Z1211 Encounter for screening for malignant neoplasm of colon: Secondary | ICD-10-CM

## 2020-03-14 NOTE — Progress Notes (Signed)
Gastroenterology Pre-Procedure Review  Request Date: Friday 05/26/20 Requesting Physician: Dr. Tobi Bastos  PATIENT REVIEW QUESTIONS: The patient responded to the following health history questions as indicated:    1. Are you having any GI issues? no 2. Do you have a personal history of Polyps? no 3. Do you have a family history of Colon Cancer or Polyps? no 4. Diabetes Mellitus? yes (type 2 oral meds) 5. Joint replacements in the past 12 months?no 6. Major health problems in the past 3 months?no 7. Any artificial heart valves, MVP, or defibrillator?no    MEDICATIONS & ALLERGIES:    Patient reports the following regarding taking any anticoagulation/antiplatelet therapy:   Plavix, Coumadin, Eliquis, Xarelto, Lovenox, Pradaxa, Brilinta, or Effient? no Aspirin? no  Patient confirms/reports the following medications:  Current Outpatient Medications  Medication Sig Dispense Refill  . ARIPiprazole (ABILIFY) 2 MG tablet Take 1 tablet (2 mg total) by mouth daily. 30 tablet 1  . aspirin 81 MG tablet Take 81 mg by mouth daily.    Marland Kitchen atorvastatin (LIPITOR) 40 MG tablet Take 1 tablet (40 mg total) by mouth daily. 90 tablet 1  . EPINEPHRINE, ANAPHYLAXIS THERAPY AGENTS, EPIPEN 2-PAK, 0.3MG /0.3ML (Injection Device)  1 Device use as directed for 30 days  Quantity: 2;  Refills: 1   Ordered :06-Mar-2012  Alba Cory MD;  Started 06-Mar-2012 Active Comments: DX: 989.5    . loratadine (CLARITIN) 10 MG tablet Take 1 tablet (10 mg total) by mouth daily. 30 tablet 11  . meloxicam (MOBIC) 15 MG tablet Take 1 tablet (15 mg total) by mouth as needed. 90 tablet 0  . metaxalone (SKELAXIN) 800 MG tablet Take 1 tablet (800 mg total) by mouth 3 (three) times daily. 90 tablet 0  . metFORMIN (GLUCOPHAGE-XR) 750 MG 24 hr tablet Take 2 tablets (1,500 mg total) by mouth daily with breakfast. 180 tablet 1  . montelukast (SINGULAIR) 10 MG tablet TAKE 1 TABLET BY MOUTH AT  BEDTIME 90 tablet 3  . ONE TOUCH LANCETS MISC 1  each by Does not apply route 2 (two) times daily. 100 each 1  . ONE TOUCH ULTRA TEST test strip USE AS INSTRUCTED TWO TIMES DAILY 200 each 2  . pantoprazole (PROTONIX) 40 MG tablet Take 1 tablet (40 mg total) by mouth daily. 90 tablet 3  . Rimegepant Sulfate (NURTEC) 75 MG TBDP Take 1 tablet by mouth every other day. 16 tablet 2  . Semaglutide,0.25 or 0.5MG /DOS, (OZEMPIC, 0.25 OR 0.5 MG/DOSE,) 2 MG/1.5ML SOPN Inject 0.375 mLs (0.5 mg total) into the skin once a week. 9 mL 0  . sertraline (ZOLOFT) 100 MG tablet Take 1 tablet (100 mg total) by mouth daily. 90 tablet 1  . temazepam (RESTORIL) 30 MG capsule TAKE 1 CAPSULE BY MOUTH  DAILY 90 capsule 0  . triamcinolone ointment (KENALOG) 0.5 % Apply 1 application topically 2 (two) times daily. 60 g 0  . valACYclovir (VALTREX) 1000 MG tablet Take 1 tablet (1,000 mg total) by mouth 2 (two) times daily. Prn 60 tablet 0  . valsartan (DIOVAN) 80 MG tablet TAKE 1 TABLET BY MOUTH  DAILY 90 tablet 1  . Vitamin D, Ergocalciferol, (DRISDOL) 1.25 MG (50000 UNIT) CAPS capsule Take 1 capsule (50,000 Units total) by mouth every 7 (seven) days. 12 capsule 3   No current facility-administered medications for this visit.    Patient confirms/reports the following allergies:  Allergies  Allergen Reactions  . Ace Inhibitors     cough  . Bee Venom  Bee  . Dapagliflozin     hematuria    No orders of the defined types were placed in this encounter.   AUTHORIZATION INFORMATION Primary Insurance: 1D#: Group #:  Secondary Insurance: 1D#: Group #:  SCHEDULE INFORMATION: Date: Friday 05/26/20 Time: Location:[facility]

## 2020-03-15 ENCOUNTER — Other Ambulatory Visit: Payer: Self-pay | Admitting: Family Medicine

## 2020-03-15 ENCOUNTER — Telehealth: Payer: Self-pay

## 2020-03-15 NOTE — Telephone Encounter (Signed)
Lucy with [facility] needs a specific amount on the  meloxicam (MOBIC) 15 MG tablet  They need to know if once daily as needed, or BID as needed.  But not just "as needed"  please resend or cb  cb [phone]  Ref [id number]

## 2020-03-16 NOTE — Telephone Encounter (Signed)
Called in.

## 2020-04-05 ENCOUNTER — Other Ambulatory Visit: Payer: Self-pay | Admitting: Family Medicine

## 2020-04-05 DIAGNOSIS — Z1231 Encounter for screening mammogram for malignant neoplasm of breast: Secondary | ICD-10-CM

## 2020-04-12 ENCOUNTER — Other Ambulatory Visit: Payer: Self-pay | Admitting: Family Medicine

## 2020-04-12 DIAGNOSIS — E785 Hyperlipidemia, unspecified: Secondary | ICD-10-CM

## 2020-04-26 ENCOUNTER — Other Ambulatory Visit: Payer: Self-pay | Admitting: Family Medicine

## 2020-04-26 ENCOUNTER — Other Ambulatory Visit: Payer: Managed Care, Other (non HMO)

## 2020-04-26 DIAGNOSIS — Z20822 Contact with and (suspected) exposure to covid-19: Secondary | ICD-10-CM

## 2020-04-26 DIAGNOSIS — F330 Major depressive disorder, recurrent, mild: Secondary | ICD-10-CM

## 2020-04-26 NOTE — Telephone Encounter (Signed)
Pt said it does work

## 2020-04-26 NOTE — Telephone Encounter (Signed)
Requested medications are due for refill today? Yes - This medication refill cannot be delegated.    Requested medications are on active medication list?  Yes  Last Refill: 03/06/2020  # 30 with one refill   Future visit scheduled?  Yes   Notes to Clinic:  This medication refill cannot be delegated.

## 2020-04-27 LAB — SARS-COV-2, NAA 2 DAY TAT

## 2020-04-27 LAB — NOVEL CORONAVIRUS, NAA: SARS-CoV-2, NAA: NOT DETECTED

## 2020-05-21 ENCOUNTER — Other Ambulatory Visit: Payer: Self-pay | Admitting: Family Medicine

## 2020-05-21 DIAGNOSIS — G4700 Insomnia, unspecified: Secondary | ICD-10-CM

## 2020-05-21 NOTE — Telephone Encounter (Signed)
Requested medication (s) are due for refill today: yes  Requested medication (s) are on the active medication list: yes  Last refill:  02/01/20  Future visit scheduled: yes  Notes to clinic:  med not delegated to NT to RF   Requested Prescriptions  Pending Prescriptions Disp Refills   temazepam (RESTORIL) 30 MG capsule [Pharmacy Med Name: TEMAZEPAM  30MG   CAP] 90 capsule     Sig: TAKE 1 CAPSULE BY MOUTH  DAILY      Not Delegated - Psychiatry:  Anxiolytics/Hypnotics Failed - 05/21/2020  9:17 AM      Failed - This refill cannot be delegated      Failed - Urine Drug Screen completed in last 360 days.      Passed - Valid encounter within last 6 months    Recent Outpatient Visits           2 months ago Controlled type 2 diabetes with neuropathy [facility])   [facility] [place], [place], MD   6 months ago Controlled type 2 diabetes with neuropathy [facility])   [facility], MD   9 months ago Controlled type 2 diabetes with neuropathy [facility])   [facility], MD   1 year ago Controlled type 2 diabetes with neuropathy [facility])   [facility], MD   1 year ago Controlled type 2 diabetes with neuropathy [facility])   [facility], MD       Future Appointments             In 2 weeks Alba Cory, MD [facility]

## 2020-05-23 ENCOUNTER — Telehealth: Payer: Self-pay

## 2020-05-23 ENCOUNTER — Other Ambulatory Visit: Payer: Self-pay

## 2020-05-23 DIAGNOSIS — G43009 Migraine without aura, not intractable, without status migrainosus: Secondary | ICD-10-CM

## 2020-05-23 MED ORDER — NURTEC 75 MG PO TBDP: 1.0000 | ORAL_TABLET | ORAL | 2 refills | Status: DC

## 2020-05-23 MED ORDER — NA SULFATE-K SULFATE-MG SULF 17.5-3.13-1.6 GM/177ML PO SOLN: 354.0000 mL | Freq: Once | ORAL | 0 refills | Status: AC

## 2020-05-23 NOTE — Telephone Encounter (Signed)
Patient left a voicemail stating the prep has not been called into the pharmacy and she does not know when to go for COVID test. Informed patient she would go [facility] covid [facility] and 1pm. Patient verbalized understanding. [facility] [place] [facility]

## 2020-05-24 ENCOUNTER — Other Ambulatory Visit
Admission: RE | Admit: 2020-05-24 | Discharge: 2020-05-24 | Disposition: A | Discharge status: Home / Self Care | Payer: Managed Care, Other (non HMO) | Source: Ambulatory Visit | Attending: Gastroenterology | Admitting: Gastroenterology

## 2020-05-24 ENCOUNTER — Other Ambulatory Visit: Payer: Self-pay

## 2020-05-24 DIAGNOSIS — Z01818 Encounter for other preprocedural examination: Secondary | ICD-10-CM | POA: Diagnosis present

## 2020-05-24 DIAGNOSIS — Z20822 Contact with and (suspected) exposure to covid-19: Secondary | ICD-10-CM | POA: Insufficient documentation

## 2020-05-24 LAB — SARS CORONAVIRUS 2 (TAT 6-24 HRS): SARS Coronavirus 2: NEGATIVE

## 2020-05-26 ENCOUNTER — Ambulatory Visit: Payer: Managed Care, Other (non HMO) | Admitting: Anesthesiology

## 2020-05-26 ENCOUNTER — Encounter
Admission: RE | Disposition: A | Discharge status: Home / Self Care | Payer: Self-pay | Source: Home / Self Care | Attending: Gastroenterology

## 2020-05-26 ENCOUNTER — Ambulatory Visit
Admission: RE | Admit: 2020-05-26 | Discharge: 2020-05-26 | Disposition: A | Discharge status: Home / Self Care | Payer: Managed Care, Other (non HMO) | Attending: Gastroenterology | Admitting: Gastroenterology

## 2020-05-26 ENCOUNTER — Other Ambulatory Visit: Payer: Self-pay

## 2020-05-26 ENCOUNTER — Encounter: Payer: Self-pay | Admitting: Gastroenterology

## 2020-05-26 DIAGNOSIS — Z1211 Encounter for screening for malignant neoplasm of colon: Secondary | ICD-10-CM | POA: Diagnosis present

## 2020-05-26 DIAGNOSIS — Z87891 Personal history of nicotine dependence: Secondary | ICD-10-CM | POA: Diagnosis not present

## 2020-05-26 DIAGNOSIS — Z91030 Bee allergy status: Secondary | ICD-10-CM | POA: Insufficient documentation

## 2020-05-26 DIAGNOSIS — Z888 Allergy status to other drugs, medicaments and biological substances status: Secondary | ICD-10-CM | POA: Insufficient documentation

## 2020-05-26 HISTORY — PX: COLONOSCOPY WITH PROPOFOL: SHX5780

## 2020-05-26 LAB — GLUCOSE, CAPILLARY: Glucose-Capillary: 194 mg/dL — ABNORMAL HIGH (ref 70–99)

## 2020-05-26 SURGERY — COLONOSCOPY WITH PROPOFOL
Anesthesia: General

## 2020-05-26 MED ORDER — PROPOFOL 500 MG/50ML IV EMUL
INTRAVENOUS | Status: AC
  Filled 2020-05-26: qty 50

## 2020-05-26 MED ORDER — PROPOFOL 500 MG/50ML IV EMUL
INTRAVENOUS | Status: DC | PRN
  Administered 2020-05-26: 180 ug/kg/min via INTRAVENOUS

## 2020-05-26 MED ORDER — PROPOFOL 500 MG/50ML IV EMUL
INTRAVENOUS | Status: AC
  Filled 2020-05-26: qty 100

## 2020-05-26 MED ORDER — ONDANSETRON HCL 4 MG/2ML IJ SOLN
INTRAMUSCULAR | Status: DC | PRN
  Administered 2020-05-26: 4 mg via INTRAVENOUS

## 2020-05-26 MED ORDER — PROPOFOL 10 MG/ML IV BOLUS
INTRAVENOUS | Status: DC | PRN
  Administered 2020-05-26: 20 mg via INTRAVENOUS
  Administered 2020-05-26: 80 mg via INTRAVENOUS
  Administered 2020-05-26: 30 mg via INTRAVENOUS

## 2020-05-26 MED ORDER — SODIUM CHLORIDE 0.9 % IV SOLN: INTRAVENOUS | Status: DC

## 2020-05-26 MED ORDER — ONDANSETRON HCL 4 MG/2ML IJ SOLN
INTRAMUSCULAR | Status: AC
  Filled 2020-05-26: qty 2

## 2020-05-26 NOTE — Anesthesia Preprocedure Evaluation (Signed)
Anesthesia Evaluation  Patient identified by MRN, date of birth, ID band Patient awake    Reviewed: Allergy & Precautions, NPO status , Patient's Chart, lab work & pertinent test results  History of Anesthesia Complications Negative for: history of anesthetic complications  Airway Mallampati: II  TM Distance: >3 FB Neck ROM: Full    Dental no notable dental hx.    Pulmonary sleep apnea and Continuous Positive Airway Pressure Ventilation , neg COPD, former smoker,    breath sounds clear to auscultation- rhonchi (-) wheezing      Cardiovascular hypertension, Pt. on medications (-) CAD, (-) Past MI, (-) Cardiac Stents and (-) CABG  Rhythm:Regular Rate:Normal - Systolic murmurs and - Diastolic murmurs    Neuro/Psych  Headaches, neg Seizures PSYCHIATRIC DISORDERS Depression    GI/Hepatic Neg liver ROS, GERD  ,  Endo/Other  diabetes, Oral Hypoglycemic Agents  Renal/GU negative Renal ROS     Musculoskeletal negative musculoskeletal ROS (+)   Abdominal (+) + obese,   Peds  Hematology negative hematology ROS (+)   Anesthesia Other Findings Past Medical History: No date: Allergy No date: Ankle edema No date: Bee sting allergy No date: Chronic idiopathic constipation No date: Chronic insomnia No date: Diabetes mellitus without complication (HCC) No date: Eczema No date: Encounter for therapeutic drug monitoring No date: GERD (gastroesophageal reflux disease) No date: Herpes simplex without complication No date: History of gastric ulcer No date: Hyperlipidemia No date: Hypertension No date: Migraine without aura and without status migrainosus, not  intractable No date: Obesity No date: OSA (obstructive sleep apnea) No date: Pain in joint, lower leg No date: Paresthesia of left arm No date: Periodic limb movement disorder No date: Symptomatic menopausal or female climacteric states   Reproductive/Obstetrics                              Anesthesia Physical Anesthesia Plan  ASA: III  Anesthesia Plan: General   Post-op Pain Management:    Induction: Intravenous  PONV Risk Score and Plan: 2 and Propofol infusion  Airway Management Planned: Natural Airway  Additional Equipment:   Intra-op Plan:   Post-operative Plan:   Informed Consent: I have reviewed the patients History and Physical, chart, labs and discussed the procedure including the risks, benefits and alternatives for the proposed anesthesia with the patient or authorized representative who has indicated his/her understanding and acceptance.     Dental advisory given  Plan Discussed with: CRNA and Anesthesiologist  Anesthesia Plan Comments:         Anesthesia Quick Evaluation

## 2020-05-26 NOTE — Op Note (Signed)
[facility] Patient Name: Joyce Blackburn Procedure Date: 05/26/2020 8:29 AM MRN: [id number] Account #: [id number] Date of Birth: [birth year] Admit Type: Outpatient Age: 59 Room: [facility] ENDO ROOM 4 Gender: Female Note Status: Finalized Procedure:             Colonoscopy Indications:           Screening for colorectal malignant neoplasm Providers:             Wyline Mood MD, MD Referring MD:          Onnie Boer. Sowles, MD (Referring MD) Medicines:             Monitored Anesthesia Care Complications:         No immediate complications. Procedure:             Pre-Anesthesia Assessment:                        - Prior to the procedure, a History and Physical was                         performed, and patient medications, allergies and                         sensitivities were reviewed. The patient's tolerance                         of previous anesthesia was reviewed.                        - The risks and benefits of the procedure and the                         sedation options and risks were discussed with the                         patient. All questions were answered and informed                         consent was obtained.                        - ASA Grade Assessment: II - A patient with mild                         systemic disease.                        After obtaining informed consent, the colonoscope was                         passed under direct vision. Throughout the procedure,                         the patient's blood pressure, pulse, and oxygen                         saturations were monitored continuously. The                         Colonoscope was introduced through the anus  and                         advanced to the the cecum, identified by the                         appendiceal orifice. The colonoscopy was performed                         with ease. The patient tolerated the procedure well.                         The quality  of the bowel preparation was excellent. Findings:      The perianal and digital rectal examinations were normal.      The entire examined colon appeared normal on direct and retroflexion       views. Impression:            - The entire examined colon is normal on direct and                         retroflexion views.                        - No specimens collected. Recommendation:        - Discharge patient to home (with escort).                        - Resume previous diet.                        - Continue present medications.                        - Repeat colonoscopy in 10 years for screening                         purposes. Procedure Code(s):     --- Professional ---                        [phone], Colonoscopy, flexible; diagnostic, including                         collection of specimen(s) by brushing or washing, when                         performed (separate procedure) Diagnosis Code(s):     --- Professional ---                        Z12.11, Encounter for screening for malignant neoplasm                         of colon CPT copyright 2019 American Medical Association. All rights reserved. The codes documented in this report are preliminary and upon coder review may  be revised to meet current compliance requirements. Wyline Mood, MD Wyline Mood MD, MD 05/26/2020 9:03:48 AM This report has been signed electronically. Number of Addenda: 0 Note Initiated On: 05/26/2020 8:29 AM Scope Withdrawal Time: 0 hours 10 minutes 15 seconds  Total Procedure Duration: 0 hours 15 minutes 20 seconds  Estimated Blood Loss:  Estimated blood loss: none.      [facility]

## 2020-05-26 NOTE — Anesthesia Postprocedure Evaluation (Signed)
Anesthesia Post Note  Patient: Joyce Blackburn  Procedure(s) Performed: COLONOSCOPY WITH PROPOFOL (N/A )  Patient location during evaluation: Endoscopy Anesthesia Type: General Level of consciousness: awake and alert and oriented Pain management: pain level controlled Vital Signs Assessment: post-procedure vital signs reviewed and stable Respiratory status: spontaneous breathing, nonlabored ventilation and respiratory function stable Cardiovascular status: blood pressure returned to baseline and stable Postop Assessment: no signs of nausea or vomiting Anesthetic complications: no   No complications documented.   Last Vitals:  Vitals:   05/26/20 0920 05/26/20 0930  BP: 140/90 (!) 147/86  Pulse: 73   Resp: 12 15  Temp:    SpO2: 100% 100%    Last Pain:  Vitals:   05/26/20 0904  TempSrc: Temporal  PainSc:                  [name]

## 2020-05-26 NOTE — Transfer of Care (Signed)
Immediate Anesthesia Transfer of Care Note  Patient: Joyce Blackburn  Procedure(s) Performed: COLONOSCOPY WITH PROPOFOL (N/A )  Patient Location: PACU  Anesthesia Type:General  Level of Consciousness: sedated  Airway & Oxygen Therapy: Patient Spontanous Breathing  Post-op Assessment: Report given to RN and Post -op Vital signs reviewed and stable  Post vital signs: Reviewed and stable  Last Vitals:  Vitals Value Taken Time  BP    Temp    Pulse 59 05/26/20 0913  Resp 18 05/26/20 0913  SpO2 98 % 05/26/20 0913  Vitals shown include unvalidated device data.  Last Pain:  Vitals:   05/26/20 0904  TempSrc: Temporal  PainSc:          Complications: No complications documented.

## 2020-05-26 NOTE — H&P (Signed)
Wyline Mood, MD [address], [address], [place], [place], [address] [address], [place], [place], [address] Phone: [phone]  Fax: [phone]  Primary Care Physician:  Alba Cory, MD   Pre-Procedure History & Physical: HPI:  Joyce Blackburn is a 59 y.o. female is here for an colonoscopy.   Past Medical History:  Diagnosis Date  . Allergy   . Ankle edema   . Bee sting allergy   . Chronic idiopathic constipation   . Chronic insomnia   . Diabetes mellitus without complication (HCC)   . Eczema   . Encounter for therapeutic drug monitoring   . GERD (gastroesophageal reflux disease)   . 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 History of gastric ulcer   . Hyperlipidemia   . Hypertension   . Migraine without aura and without status migrainosus, not intractable   . Obesity   . OSA (obstructive sleep apnea)   . Pain in joint, lower leg   . Paresthesia of left arm   . Periodic limb movement disorder   . Symptomatic menopausal or female climacteric states     Past Surgical History:  Procedure Laterality Date  . ABDOMINAL HYSTERECTOMY    . RETINAL TEAR REPAIR CRYOTHERAPY Right    [facility] (Dr. Robin Searing)    Prior to Admission medications   Medication Sig Start Date End Date Taking? Authorizing Provider  ARIPiprazole (ABILIFY) 2 MG tablet TAKE 1 TABLET BY MOUTH EVERY DAY 04/27/20  Yes Sowles, Danna Hefty, MD  aspirin 81 MG tablet Take 81 mg by mouth daily.   Yes [provider]  atorvastatin (LIPITOR) 40 MG tablet TAKE 1 TABLET BY MOUTH  DAILY 04/12/20  Yes Sowles, Danna Hefty, MD  loratadine (CLARITIN) 10 MG tablet Take 1 tablet (10 mg total) by mouth daily. 12/01/15  Yes Sowles, Danna Hefty, MD  meloxicam (MOBIC) 15 MG tablet Take 1 tablet (15 mg total) by mouth as needed. 03/13/20  Yes Sowles, Danna Hefty, MD  metaxalone (SKELAXIN) 800 MG tablet Take 1 tablet (800 mg total) by mouth 3 (three) times daily. 06/29/18  Yes Sowles, Danna Hefty, MD  metFORMIN  (GLUCOPHAGE-XR) 750 MG 24 hr tablet Take 2 tablets (1,500 mg total) by mouth daily with breakfast. 11/02/19  Yes Sowles, Danna Hefty, MD  montelukast (SINGULAIR) 10 MG tablet TAKE 1 TABLET BY MOUTH AT  BEDTIME 10/16/19  Yes Sowles, Danna Hefty, MD  pantoprazole (PROTONIX) 40 MG tablet Take 1 tablet (40 mg total) by mouth daily. 03/06/20  Yes Sowles, Danna Hefty, MD  Rimegepant Sulfate (NURTEC) 75 MG TBDP Take 1 tablet by mouth every other day. 05/23/20  Yes Sowles, Danna Hefty, MD  Semaglutide,0.25 or 0.5MG /DOS, (OZEMPIC, 0.25 OR 0.5 MG/DOSE,) 2 MG/1.5ML SOPN Inject 0.375 mLs (0.5 mg total) into the skin once a week. 03/06/20  Yes Sowles, Danna Hefty, MD  sertraline (ZOLOFT) 100 MG tablet Take 1 tablet (100 mg total) by mouth daily. 03/06/20  Yes Sowles, Danna Hefty, MD  temazepam (RESTORIL) 30 MG capsule TAKE 1 CAPSULE BY MOUTH  DAILY 02/21/20  Yes Sowles, Danna Hefty, MD  valACYclovir (VALTREX) 1000 MG tablet Take 1 tablet (1,000 mg total) by mouth 2 (two) times daily. Prn 10/06/18  Yes Sowles, Danna Hefty, MD  valsartan (DIOVAN) 80 MG tablet TAKE 1 TABLET BY MOUTH  DAILY 01/03/20  Yes Sowles, Danna Hefty, MD  Vitamin D, Ergocalciferol, (DRISDOL) 1.25 MG (50000 UNIT) CAPS capsule Take 1 capsule (50,000 Units total) by mouth every 7 (seven) days. 11/02/19  Yes Alba Cory, MD  EPINEPHRINE, ANAPHYLAXIS THERAPY AGENTS,  EPIPEN 2-PAK, 0.3MG /0.3ML (Injection Device)  1 Device use as directed for 30 days  Quantity: 2;  Refills: 1   Ordered :06-Mar-2012  Alba Cory MD;  Mora Appl 06-Mar-2012 Active Comments: DX: 989.5 03/06/12   [provider]  ONE TOUCH LANCETS MISC 1 each by Does not apply route 2 (two) times daily. 04/25/16   Alba Cory, MD  ONE TOUCH ULTRA TEST test strip USE AS INSTRUCTED TWO TIMES DAILY 08/29/16   Alba Cory, MD  triamcinolone ointment (KENALOG) 0.5 % Apply 1 application topically 2 (two) times daily. 01/27/19   Alba Cory, MD    Allergies as of 03/14/2020 - Review Complete 03/14/2020  Allergen Reaction  Noted  . Ace inhibitors  02/14/2015  . Bee venom  02/14/2015  . Dapagliflozin  02/14/2015    Family History  Problem Relation Age of Onset  . Mental illness Mother   . Diabetes Mother   . CVA Mother   . Schizophrenia Mother   . Diabetes Sister   . Hypertension Sister   . Diabetes Brother   . Mental illness Brother   . Bipolar disorder Brother   . Schizophrenia Brother     Social History   Socioeconomic History  . Marital status: Widowed    Spouse name: Not on file  . Number of children: 0  . Years of education: Not on file  . Highest education level: Associate degree: occupational, Scientist, product/process development, or vocational program  Occupational History  . 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 Smoking status: Former Smoker    Packs/day: 0.10    Years: 15.00    Pack years: 1.50    Quit date: 07/29/2004    Years since quitting: 15.8  . Smokeless tobacco: Never Used  Vaping Use  . Vaping Use: Never used  Substance and Sexual Activity  . Alcohol use: No    Alcohol/week: 0.0 standard drinks  . Drug use: No  . Sexual activity: Not Currently  Other Topics Concern  . Not on file  Social History Narrative   She has 3 step-daughters, but just has contact with the one in [place] one in [place] and the one that was in [place] moved back home to stay with her - but daughter's husband is still in [place] - lives in [place].    She has 3 grand-daughter and one grandson    One Engineer, maintenance (IT) and grandson live with her.    Social Determinants of Health   Financial Resource Strain:   . Difficulty of Paying Living Expenses: Not on file  Food Insecurity:   . Worried About Programme researcher, broadcasting/film/video in the Last Year: Not on file  . Ran Out of Food in the Last Year: Not on file  Transportation Needs:   . Lack of Transportation (Medical): Not on file  . Lack of Transportation (Non-Medical): Not on file  Physical Activity:   . Days of Exercise per  Week: Not on file  . Minutes of Exercise per Session: Not on file  Stress:   . Feeling of Stress : Not on file  Social Connections:   . Frequency of Communication with Friends and Family: Not on file  . Frequency of Social Gatherings with Friends and Family: Not on file  . Attends Religious Services: Not on file  . Active Member of Clubs or Organizations: Not on file  . Attends Banker Meetings: Not on file  .  Marital Status: Not on file  Intimate Partner Violence:   . Fear of Current or Ex-Partner: Not on file  . Emotionally Abused: Not on file  . Physically Abused: Not on file  . Sexually Abused: Not on file    Review of Systems: See HPI, otherwise negative ROS  Physical Exam: BP (!) 151/86   Pulse 69   Temp (!) 96.2 F (35.7 C) (Temporal)   Resp 16   Ht 5\' 6"  (1.676 m)   Wt 87.1 kg   SpO2 100%   BMI 30.99 kg/m  General:   Alert,  pleasant and cooperative in NAD Head:  Normocephalic and atraumatic. Neck:  Supple; no masses or thyromegaly. Lungs:  Clear throughout to auscultation, normal respiratory effort.    Heart:  +S1, +S2, Regular rate and rhythm, No edema. Abdomen:  Soft, nontender and nondistended. Normal bowel sounds, without guarding, and without rebound.   Neurologic:  Alert and  oriented x4;  grossly normal neurologically.  Impression/Plan: Joyce Blackburn is here for an colonoscopy to be performed for Screening colonoscopy average risk   Risks, benefits, limitations, and alternatives regarding  colonoscopy have been reviewed with the patient.  Questions have been answered.  All parties agreeable.   Fredderick Phenix, MD  05/26/2020, 8:40 AM

## 2020-05-27 ENCOUNTER — Encounter: Payer: Self-pay | Admitting: Family Medicine

## 2020-05-29 ENCOUNTER — Encounter: Payer: Self-pay | Admitting: Gastroenterology

## 2020-06-05 NOTE — Progress Notes (Signed)
Name: Joyce Blackburn   MRN: [id number]    DOB: 07/15/1961   Date:06/06/2020       Progress Note  Subjective  Chief Complaint  Follow up   HPI   DMII: Shehas not beenchecking fsbson a regular basis.She denies polyphagia, polydipsia or polyuria. Started on Tresiba Fall 2016but is now on Toujeo 25 units  Metformin, she is back on lower dose of Ozempic at 0.5 mg per week. She states she has been drinking more sodas, snacking in the middle of the night, splurged during Halloween . Taking ARB and statin therapy , she states neuropathy is better, symptoms no longer daily. A1C today is up from 7.5 % to 8.6 %. We will adjust dose of Toujeo to 30 units, she will resume a healthier diet and avoid Peps[employer]'s.  GERD: she also has a history of gastric ulcerover 20years ago. She states occasionally still has nausea with certain smells, like grease. She was seen by Dr. Lars PinksWhol and had multiple labs done for evaluation of fatty liver, HIDA was normal. She is onPantoprazole ( Dexilant no longer covered), very seldom has diarrhea but usually triggered by fatty diet . She is not having as much bloating since she went down on dose of Ozempic   OSA:she has been wearing CPAP every night.     Dyslipidemia: taking Lipitor and is tolerating it well. No myalgia or chest pain.LDL is at goal No side effect  HTN: bp is at goal,  but denies pain, palpitation or dizziness . Continue ARB, no side effects   Vitamin D deficiency:level was low, we will recheck labs next visit   Menopause/major depressionmild: she states Zoloft100 mgand states hot flashes is still present but night sweats has improvedshe is doing well, she was feeling more emotional and we added Abilify back in 02/2020 she states her mood has improved  Insomnia: she is taking Temazepam andshe has been sleeping well on medication. She ran out of mediation, we will send 30 days to local pharmacy and she will ask for 90 days after she gets the  refill.   Morbid obesity: based on BMI and risk factors. She gained weight since last visit but will try to resume a healthier diet    Migraine headaches:  Episodes are starting suddenly associated with nausea and vomiting, episodes are still about once a week, but severe about 2 times per month, but still able to work, Nurteq causes her to feel sleepy and too expensive, we will Ubrelvy instead   Paresthesia left hand: she has noticed left elbow pain for the past months, tenderness on left elbow that she has been wearing an elbow brace, no weakness associated with symptoms. Tender to touch on left elbow. She saw by Dr. Katrinka BlazingSmith, she was diagnosed with lateral epicondylitis, x-ray negative , he discussed PT for lumbar spine and meloxicam for pain, she states still has pain but only takes medication prn.   Patient Active Problem List   Diagnosis Date Noted  . Major depression in remission (HCC) 12/19/2017  . Fatty liver 03/04/2016  . Vitamin D deficiency 03/04/2016  . Gastroesophageal reflux disease without esophagitis 07/04/2015  . Diabetic polyneuropathy associated with type 2 diabetes mellitus (HCC) 03/03/2015  . Peripheral neuropathy 03/03/2015  . Allergic to bees 02/20/2015  . Benign hypertension 02/20/2015  . Insomnia, persistent 02/20/2015  . Chronic idiopathic constipation 02/20/2015  . Dyslipidemia 02/20/2015  . Dermatitis, eczematoid 02/20/2015  . H/O gastric ulcer 02/20/2015  . Herpes 02/20/2015  . Migraine without  aura and responsive to treatment 02/20/2015  . Morbid obesity (HCC) 02/20/2015  . Obstructive apnea 02/20/2015  . Paresthesia of arm 02/20/2015  . Periodic limb movement 02/20/2015  . Allergic rhinitis, seasonal 02/20/2015  . Menopausal symptom 02/20/2015    Past Surgical History:  Procedure Laterality Date  . ABDOMINAL HYSTERECTOMY    . COLONOSCOPY WITH PROPOFOL N/A 05/26/2020   Procedure: COLONOSCOPY WITH PROPOFOL;  Surgeon: Wyline Mood, MD;  Location: [facility]  ENDOSCOPY;  Service: Gastroenterology;  Laterality: N/A;  . RETINAL TEAR REPAIR CRYOTHERAPY Right    [facility] (Dr. Robin Searing)    Family History  Problem Relation Age of Onset  . Mental illness Mother   . Diabetes Mother   . CVA Mother   . Schizophrenia Mother   . Diabetes Sister   . Hypertension Sister   . Diabetes Brother   . Mental illness Brother   . Bipolar disorder Brother   . Schizophrenia Brother     Social History   Tobacco Use  . Smoking status: Former Smoker    Packs/day: 0.10    Years: 15.00    Pack years: 1.50    Quit date: 07/29/2004    Years since quitting: 15.8  . Smokeless tobacco: Never Used  Substance Use Topics  . Alcohol use: No    Alcohol/week: 0.0 standard drinks     Current Outpatient Medications:  .  aspirin 81 MG tablet, Take 81 mg by mouth daily., Disp: , Rfl:  .  atorvastatin (LIPITOR) 40 MG tablet, TAKE 1 TABLET BY MOUTH  DAILY, Disp: 90 tablet, Rfl: 1 .  EPINEPHRINE, ANAPHYLAXIS THERAPY AGENTS,, EPIPEN 2-PAK, 0.3MG /0.3ML (Injection Device)  1 Device use as directed for 30 days  Quantity: 2;  Refills: 1   Ordered :06-Mar-2012  Alba Cory MD;  Started 06-Mar-2012 Active Comments: DX: 989.5, Disp: , Rfl:  .  insulin glargine, 1 Unit Dial, (TOUJEO SOLOSTAR) 300 UNIT/ML Solostar Pen, Inject 30 Units into the skin daily., Disp: 15 mL, Rfl: 1 .  Insulin Pen Needle (NOVOFINE PEN NEEDLE) 32G X 6 MM MISC, See admin instructions., Disp: , Rfl:  .  loratadine (CLARITIN) 10 MG tablet, Take 1 tablet (10 mg total) by mouth daily., Disp: 30 tablet, Rfl: 11 .  metFORMIN (GLUCOPHAGE-XR) 750 MG 24 hr tablet, Take 2 tablets (1,500 mg total) by mouth daily with breakfast., Disp: 180 tablet, Rfl: 1 .  montelukast (SINGULAIR) 10 MG tablet, TAKE 1 TABLET BY MOUTH AT  BEDTIME, Disp: 90 tablet, Rfl: 3 .  ONE TOUCH LANCETS MISC, 1 each by Does not apply route 2 (two) times daily., Disp: 100 each, Rfl: 1 .  ONE TOUCH ULTRA TEST test strip, USE AS INSTRUCTED TWO TIMES  DAILY, Disp: 200 each, Rfl: 2 .  pantoprazole (PROTONIX) 40 MG tablet, Take 1 tablet (40 mg total) by mouth daily., Disp: 90 tablet, Rfl: 3 .  Semaglutide,0.25 or 0.5MG /DOS, (OZEMPIC, 0.25 OR 0.5 MG/DOSE,) 2 MG/1.5ML SOPN, Inject 0.5 mg into the skin once a week., Disp: 9 mL, Rfl: 0 .  sertraline (ZOLOFT) 100 MG tablet, Take 1 tablet (100 mg total) by mouth daily., Disp: 90 tablet, Rfl: 1 .  temazepam (RESTORIL) 30 MG capsule, Take 1 capsule (30 mg total) by mouth daily., Disp: 30 capsule, Rfl: 0 .  triamcinolone ointment (KENALOG) 0.5 %, Apply 1 application topically 2 (two) times daily., Disp: 60 g, Rfl: 0 .  valACYclovir (VALTREX) 1000 MG tablet, Take 1 tablet (1,000 mg total) by mouth 2 (two) times daily. Prn,  Disp: 60 tablet, Rfl: 0 .  valsartan (DIOVAN) 80 MG tablet, Take 1 tablet (80 mg total) by mouth daily., Disp: 90 tablet, Rfl: 1 .  Vitamin D, Ergocalciferol, (DRISDOL) 1.25 MG (50000 UNIT) CAPS capsule, Take 1 capsule (50,000 Units total) by mouth every 7 (seven) days., Disp: 12 capsule, Rfl: 3 .  ARIPiprazole (ABILIFY) 2 MG tablet, Take 1 tablet (2 mg total) by mouth daily., Disp: 90 tablet, Rfl: 1 .  meloxicam (MOBIC) 15 MG tablet, Take 1 tablet (15 mg total) by mouth as needed. (Patient not taking: Reported on 06/06/2020), Disp: 90 tablet, Rfl: 0 .  metaxalone (SKELAXIN) 800 MG tablet, Take 1 tablet (800 mg total) by mouth 3 (three) times daily. (Patient not taking: Reported on 06/06/2020), Disp: 90 tablet, Rfl: 0 .  Ubrogepant (UBRELVY) 100 MG TABS, Take 1 tablet by mouth daily as needed., Disp: 10 tablet, Rfl: 2  Allergies  Allergen Reactions  . Ace Inhibitors     cough  . Bee Venom     Bee  . Dapagliflozin     hematuria    I personally reviewed active problem list, medication list, allergies, family history, social history, health maintenance with the patient/caregiver today.   ROS  Constitutional: Negative for fever or weight change.  Respiratory: Negative for cough and  shortness of breath.   Cardiovascular: Negative for chest pain or palpitations.  Gastrointestinal: Negative for abdominal pain, no bowel changes.  Musculoskeletal: Negative for gait problem or joint swelling.  Skin: Negative for rash.  Neurological: Negative for dizziness or headache.  No other specific complaints in a complete review of systems (except as listed in HPI above).  Objective  Vitals:   06/06/20 0829  BP: 124/82  Pulse: 97  Resp: 16  Temp: 98.1 F (36.7 C)  TempSrc: Oral  SpO2: 98%  Weight: 202 lb 12.8 oz (92 kg)  Height:  (1.702 m)    Body mass index is 31.76 kg/m.  Physical Exam  Constitutional: Patient appears well-developed and well-nourished. Obese  No distress.  HEENT: head atraumatic, normocephalic, pupils equal and reactive to light,  neck supple Cardiovascular: Normal rate, regular rhythm and normal heart sounds.  No murmur heard. No BLE edema. Pulmonary/Chest: Effort normal and breath sounds normal. No respiratory distress. Abdominal: Soft.  There is no tenderness. Psychiatric: Patient has a normal mood and affect. behavior is normal. Judgment and thought content normal.  Recent Results (from the past 2160 hour(s))  Novel Coronavirus, NAA ([facility])     Status: None   Collection Time: 04/26/20 12:00 AM   Specimen: Nasopharyngeal(NP) swabs in vial transport medium   Nasopharynge  Screenin  Result Value Ref Range   SARS-CoV-2, NAA Not Detected Not Detected    Comment: This nucleic acid amplification test was developed and its performance characteristics determined by [employer]. Nucleic acid amplification tests include RT-PCR and TMA. This test has not been FDA cleared or approved. This test has been authorized by FDA under an Emergency Use Authorization (EUA). This test is only authorized for the duration of time the declaration that circumstances exist justifying the authorization of the emergency use of in vitro diagnostic tests  for detection of SARS-CoV-2 virus and/or diagnosis of COVID-19 infection under section 564(b)(1) of the Act, 21 U.S.C. 161WRU-0(A) (1), unless the authorization is terminated or revoked sooner. When diagnostic testing is negative, the possibility of a false negative result should be considered in the context of a patient's recent exposures and the presence of clinical signs  and symptoms consistent with COVID-19. An individual without symptoms of COVID-19 and who is not shedding SARS-CoV-2 virus wo uld expect to have a negative (not detected) result in this assay.   SARS-COV-2, NAA 2 DAY TAT     Status: None   Collection Time: 04/26/20 12:00 AM   Nasopharynge  Screenin  Result Value Ref Range   SARS-CoV-2, NAA 2 DAY TAT Performed   SARS CORONAVIRUS 2 (TAT 6-24 HRS) Nasopharyngeal Nasopharyngeal Swab     Status: None   Collection Time: 05/24/20  1:46 PM   Specimen: Nasopharyngeal Swab  Result Value Ref Range   SARS Coronavirus 2 NEGATIVE NEGATIVE    Comment: (NOTE) SARS-CoV-2 target nucleic acids are NOT DETECTED.  The SARS-CoV-2 RNA is generally detectable in upper and lower respiratory specimens during the acute phase of infection. Negative results do not preclude SARS-CoV-2 infection, do not rule out co-infections with other pathogens, and should not be used as the sole basis for treatment or other patient management decisions. Negative results must be combined with clinical observations, patient history, and epidemiological information. The expected result is Negative.  Fact Sheet for Patients: HairSlick.no  Fact Sheet for Healthcare Providers: quierodirigir.com  This test is not yet approved or cleared by the [place] FDA and  has been authorized for detection and/or diagnosis of SARS-CoV-2 by FDA under an Emergency Use Authorization (EUA). This EUA will remain  in effect (meaning this test can be used) for the  duration of the COVID-19 declaration under Se ction 564(b)(1) of the Act, 21 U.S.C. section 360bbb-3(b)(1), unless the authorization is terminated or revoked sooner.  Performed at [facility], [address], [place], [place] [address]   Glucose, capillary     Status: Abnormal   Collection Time: 05/26/20  8:27 AM  Result Value Ref Range   Glucose-Capillary 194 (H) 70 - 99 mg/dL    Comment: Glucose reference range applies only to samples taken after fasting for at least 8 hours.  POCT HgB A1C     Status: Abnormal   Collection Time: 06/06/20  8:47 AM  Result Value Ref Range   Hemoglobin A1C 8.6 (A) 4.0 - 5.6 %   HbA1c POC (<> result, manual entry)     HbA1c, POC (prediabetic range)     HbA1c, POC (controlled diabetic range)        PHQ2/9: Depression screen [facility] 2/9 06/06/2020 03/06/2020 11/02/2019 08/03/2019 04/30/2019  Decreased Interest 1 0 0 1 1  Down, Depressed, Hopeless 1 0 0 0 0  PHQ - 2 Score 2 0 0 1 1  Altered sleeping 3 0 0 3 0  Tired, decreased energy 2 0 0 0 1  Change in appetite 1 0 0 0 0  Feeling bad or failure about yourself  0 0 0 0 0  Trouble concentrating 0 0 0 0 0  Moving slowly or fidgety/restless 0 0 0 0 0  Suicidal thoughts 0 0 0 0 0  PHQ-9 Score 8 0 0 4 2  Difficult doing work/chores Not difficult at all - - Not difficult at all Not difficult at all  Some recent data might be hidden    phq 9 is positive   Fall Risk: Fall Risk  06/06/2020 03/06/2020 11/02/2019 08/03/2019 04/30/2019  Falls in the past year? 0 0 0 0 -  Number falls in past yr: 0 0 0 0 0  Injury with Fall? 0 0 0 0 0    Functional Status Survey: Is the  patient deaf or have difficulty hearing?: No Does the patient have difficulty seeing, even when wearing glasses/contacts?: Yes Does the patient have difficulty concentrating, remembering, or making decisions?: No Does the patient have difficulty walking or climbing stairs?: Yes Does the patient have difficulty dressing or bathing?: No Does  the patient have difficulty doing errands alone such as visiting a doctor's office or shopping?: No    Assessment & Plan  1. Diabetic polyneuropathy associated with type 2 diabetes mellitus (HCC)  - Semaglutide,0.25 or 0.5MG /DOS, (OZEMPIC, 0.25 OR 0.5 MG/DOSE,) 2 MG/1.5ML SOPN; Inject 0.5 mg into the skin once a week.  Dispense: 9 mL; Refill: 0 - metFORMIN (GLUCOPHAGE-XR) 750 MG 24 hr tablet; Take 2 tablets (1,500 mg total) by mouth daily with breakfast.  Dispense: 180 tablet; Refill: 1  2. OSA on CPAP  Continue CPAP  3. Benign hypertension  - valsartan (DIOVAN) 80 MG tablet; Take 1 tablet (80 mg total) by mouth daily.  Dispense: 90 tablet; Refill: 1  4. Dyslipidemia   5. Insomnia, persistent  - temazepam (RESTORIL) 30 MG capsule; Take 1 capsule (30 mg total) by mouth daily.  Dispense: 30 capsule; Refill: 0  6. Vitamin D deficiency   7. Need for immunization against influenza  - Flu Vaccine QUAD 36+ mos IM  8. Hypertension associated with diabetes (HCC)  - POCT HgB A1C - valsartan (DIOVAN) 80 MG tablet; Take 1 tablet (80 mg total) by mouth daily.  Dispense: 90 tablet; Refill: 1  9. Gastroesophageal reflux disease without esophagitis   10. Migraine without aura and without status migrainosus, not intractable  - Ubrogepant (UBRELVY) 100 MG TABS; Take 1 tablet by mouth daily as needed.  Dispense: 10 tablet; Refill: 2  11. Dyslipidemia associated with type 2 diabetes mellitus (HCC)  - Insulin Pen Needle (NOVOFINE PEN NEEDLE) 32G X 6 MM MISC; See admin instructions. - Semaglutide,0.25 or 0.5MG /DOS, (OZEMPIC, 0.25 OR 0.5 MG/DOSE,) 2 MG/1.5ML SOPN; Inject 0.5 mg into the skin once a week.  Dispense: 9 mL; Refill: 0 - insulin glargine, 1 Unit Dial, (TOUJEO SOLOSTAR) 300 UNIT/ML Solostar Pen; Inject 30 Units into the skin daily.  Dispense: 15 mL; Refill: 1  12. Mild recurrent major depression (HCC)  - ARIPiprazole (ABILIFY) 2 MG tablet; Take 1 tablet (2 mg total) by mouth  daily.  Dispense: 90 tablet; Refill: 1

## 2020-06-06 ENCOUNTER — Encounter: Payer: Self-pay | Admitting: Family Medicine

## 2020-06-06 ENCOUNTER — Other Ambulatory Visit: Payer: Self-pay

## 2020-06-06 ENCOUNTER — Ambulatory Visit
Admission: RE | Admit: 2020-06-06 | Discharge: 2020-06-06 | Disposition: A | Discharge status: Home / Self Care | Payer: Managed Care, Other (non HMO) | Source: Ambulatory Visit | Attending: Family Medicine | Admitting: Family Medicine

## 2020-06-06 ENCOUNTER — Ambulatory Visit: Payer: Managed Care, Other (non HMO) | Admitting: Family Medicine

## 2020-06-06 VITALS — BP 124/82 | HR 97 | Temp 98.1°F | Resp 16 | Ht 67.0 in | Wt 202.8 lb

## 2020-06-06 DIAGNOSIS — Z1231 Encounter for screening mammogram for malignant neoplasm of breast: Secondary | ICD-10-CM | POA: Insufficient documentation

## 2020-06-06 DIAGNOSIS — E1142 Type 2 diabetes mellitus with diabetic polyneuropathy: Secondary | ICD-10-CM | POA: Diagnosis not present

## 2020-06-06 DIAGNOSIS — E785 Hyperlipidemia, unspecified: Secondary | ICD-10-CM | POA: Diagnosis not present

## 2020-06-06 DIAGNOSIS — G4733 Obstructive sleep apnea (adult) (pediatric): Secondary | ICD-10-CM | POA: Diagnosis not present

## 2020-06-06 DIAGNOSIS — K219 Gastro-esophageal reflux disease without esophagitis: Secondary | ICD-10-CM

## 2020-06-06 DIAGNOSIS — E1159 Type 2 diabetes mellitus with other circulatory complications: Secondary | ICD-10-CM

## 2020-06-06 DIAGNOSIS — F330 Major depressive disorder, recurrent, mild: Secondary | ICD-10-CM

## 2020-06-06 DIAGNOSIS — Z9989 Dependence on other enabling machines and devices: Secondary | ICD-10-CM

## 2020-06-06 DIAGNOSIS — E1140 Type 2 diabetes mellitus with diabetic neuropathy, unspecified: Secondary | ICD-10-CM

## 2020-06-06 DIAGNOSIS — I152 Hypertension secondary to endocrine disorders: Secondary | ICD-10-CM

## 2020-06-06 DIAGNOSIS — G43009 Migraine without aura, not intractable, without status migrainosus: Secondary | ICD-10-CM

## 2020-06-06 DIAGNOSIS — G4700 Insomnia, unspecified: Secondary | ICD-10-CM

## 2020-06-06 DIAGNOSIS — Z23 Encounter for immunization: Secondary | ICD-10-CM | POA: Diagnosis not present

## 2020-06-06 DIAGNOSIS — I10 Essential (primary) hypertension: Secondary | ICD-10-CM | POA: Diagnosis not present

## 2020-06-06 DIAGNOSIS — E559 Vitamin D deficiency, unspecified: Secondary | ICD-10-CM

## 2020-06-06 DIAGNOSIS — E1169 Type 2 diabetes mellitus with other specified complication: Secondary | ICD-10-CM

## 2020-06-06 LAB — POCT GLYCOSYLATED HEMOGLOBIN (HGB A1C): Hemoglobin A1C: 8.6 % — AB (ref 4.0–5.6)

## 2020-06-06 MED ORDER — TEMAZEPAM 30 MG PO CAPS: 30.0000 mg | ORAL_CAPSULE | Freq: Every day | ORAL | 0 refills | Status: DC

## 2020-06-06 MED ORDER — METFORMIN HCL ER 750 MG PO TB24: 1500.0000 mg | ORAL_TABLET | Freq: Every day | ORAL | 1 refills | Status: DC

## 2020-06-06 MED ORDER — OZEMPIC (0.25 OR 0.5 MG/DOSE) 2 MG/1.5ML ~~LOC~~ SOPN: 0.5000 mg | PEN_INJECTOR | SUBCUTANEOUS | 0 refills | Status: DC

## 2020-06-06 MED ORDER — VALSARTAN 80 MG PO TABS: 80.0000 mg | ORAL_TABLET | Freq: Every day | ORAL | 1 refills | Status: DC

## 2020-06-06 MED ORDER — UBRELVY 100 MG PO TABS: 1.0000 | ORAL_TABLET | Freq: Every day | ORAL | 2 refills | Status: DC | PRN

## 2020-06-06 MED ORDER — ARIPIPRAZOLE 2 MG PO TABS: 2.0000 mg | ORAL_TABLET | Freq: Every day | ORAL | 1 refills | Status: DC

## 2020-06-06 MED ORDER — TOUJEO SOLOSTAR 300 UNIT/ML ~~LOC~~ SOPN: 30.0000 [IU] | PEN_INJECTOR | Freq: Every day | SUBCUTANEOUS | 1 refills | Status: DC

## 2020-06-25 ENCOUNTER — Encounter: Payer: Self-pay | Admitting: Family Medicine

## 2020-06-26 ENCOUNTER — Other Ambulatory Visit: Payer: Self-pay

## 2020-06-26 DIAGNOSIS — G4700 Insomnia, unspecified: Secondary | ICD-10-CM

## 2020-06-27 MED ORDER — TEMAZEPAM 30 MG PO CAPS: 30.0000 mg | ORAL_CAPSULE | Freq: Every day | ORAL | 0 refills | Status: DC

## 2020-06-29 ENCOUNTER — Telehealth: Payer: Self-pay | Admitting: Family Medicine

## 2020-06-29 NOTE — Telephone Encounter (Signed)
Cover my meds called about the PA needed for Nurtec / and wanted to see if office needed assistance with the PA/ please advise

## 2020-06-30 ENCOUNTER — Telehealth: Payer: Self-pay | Admitting: Family Medicine

## 2020-06-30 NOTE — Telephone Encounter (Signed)
Wants to know How many tablets does Dr want to supply for a 30 day supply and the direction for how Dr wants Pt to take this medication Ubrogepant (UBRELVY) 100 MG TABS/ please advise

## 2020-07-11 ENCOUNTER — Telehealth: Payer: Self-pay

## 2020-07-11 NOTE — Telephone Encounter (Signed)
Copied from CRM [id number]. Topic: General - Other >> Jul 11, 2020 12:00 PM Jaquita Rector A wrote: Reason for CRM: Milly with [employer]ur[employer] called in to check status of Prior Auth that was submitted asking for a call back please. Please leave the patient name, DOB and Status of prior authorization when call is returned if there is no answer Ph# [phone] ext# [phone]

## 2020-09-08 ENCOUNTER — Other Ambulatory Visit: Payer: Self-pay | Admitting: Family Medicine

## 2020-09-08 DIAGNOSIS — J302 Other seasonal allergic rhinitis: Secondary | ICD-10-CM

## 2020-09-11 NOTE — Progress Notes (Unsigned)
Name: Joyce Blackburn   MRN: [id number]    DOB: 12/31/1960   Date:09/12/2020       Progress Note  Subjective  Chief Complaint  Follow Up  HPI  DMII: Shehas not beenchecking fsbson a regular basis.She denies polyphagia, polydipsia or polyuria. Started on Tresiba Fall 2016but switched to Toujeo 25 units  Metformin, she was also on Ozempic at 0.5 mg but stopped about one month ago because of the belching and indigestion.Taking ARB and statin therapy , she states neuropathy is better, symptoms no longer daily. A1C today is up from 7.5 % to 8.6 % today is 7.9 %, discussed trying to resuming [place], since we are not sure if it was the cause of hematuria, she will monitor and notify me if unable to tolerate it . She is still one soda daily  GERD: she also has a history of gastric ulcerover 20years ago. She states occasionally still has nausea with certain smells, like grease. She was seen by Dr. Lars Pinks and had multiple labs done for evaluation of fatty liver, HIDA was normal. She is onPantoprazole ( Dexilant no longer covered), Ozempic made symptoms worse, with bloating, belching sulfa taste and vomiting, but since she stopped she is back to her baseline   OSA:she has been wearing CPAP every night.  Unchanged  Dyslipidemia: taking Lipitor and is tolerating it well. No myalgia or chest pain.LDL is at goal No side effect, we will recheck labs today  HTN: bp is at goal,  but denies pain, palpitation , she has some orthostatic changes but not often . Continue medication    Vitamin D deficiency:level was low, we will recheck labs today   Menopause/major depressionmild: she states Zoloft100 mgand states hot flashes is still present but night sweats has improvedshe is doing well, she was feeling more emotional and we added Abilify back in 02/2020, she states stable, only taking half of Abilify advised to go up to one pill. She is feeling lonely lately, misses her husband.    Insomnia: she is taking Temazepam andshe has been sleeping well on medication. Sometimes still has difficulty staying asleep  Morbid obesity: based on BMI and risk factors.Discussed importance of eating a healthier diet    Migraine headaches:  Episodes are starting suddenly associated with nausea and vomiting, episodes are still about once a week, but severe about 2 times per month, but still able to work. She has Ubrelvy  Paresthesia left hand: she has noticed left elbow pain since Summer 2021  tenderness on left elbow that she has been wearing an elbow brace, no weakness associated with symptoms. Tender to touch on left elbow. She saw by Dr. Katrinka Blazing, she was diagnosed with lateral epicondylitis, x-ray negative , he discussed PT for lumbar spine and meloxicam for pain, she states still has pain but only takes medication prn. Unchanged   Patient Active Problem List   Diagnosis Date Noted  . Major depression in remission (HCC) 12/19/2017  . Fatty liver 03/04/2016  . Vitamin D deficiency 03/04/2016  . Gastroesophageal reflux disease without esophagitis 07/04/2015  . Diabetic polyneuropathy associated with type 2 diabetes mellitus (HCC) 03/03/2015  . Peripheral neuropathy 03/03/2015  . Allergic to bees 02/20/2015  . Benign hypertension 02/20/2015  . Insomnia, persistent 02/20/2015  . Chronic idiopathic constipation 02/20/2015  . Dyslipidemia 02/20/2015  . Dermatitis, eczematoid 02/20/2015  . H/O gastric ulcer 02/20/2015  . Herpes 02/20/2015  . Migraine without aura and responsive to treatment 02/20/2015  . Morbid obesity (HCC) 02/20/2015  .  Obstructive apnea 02/20/2015  . Paresthesia of arm 02/20/2015  . Periodic limb movement 02/20/2015  . Allergic rhinitis, seasonal 02/20/2015  . Menopausal symptom 02/20/2015    Past Surgical History:  Procedure Laterality Date  . ABDOMINAL HYSTERECTOMY    . COLONOSCOPY WITH PROPOFOL N/A 05/26/2020   Procedure: COLONOSCOPY WITH PROPOFOL;   Surgeon: Wyline Mood, MD;  Location: [facility] ENDOSCOPY;  Service: Gastroenterology;  Laterality: N/A;  . RETINAL TEAR REPAIR CRYOTHERAPY Right    [facility] (Dr. Robin Searing)    Family History  Problem Relation Age of Onset  . Mental illness Mother   . Diabetes Mother   . CVA Mother   . Schizophrenia Mother   . Diabetes Sister   . Hypertension Sister   . Diabetes Brother   . Mental illness Brother   . Bipolar disorder Brother   . Schizophrenia Brother     Social History   Tobacco Use  . Smoking status: Former Smoker    Packs/day: 0.10    Years: 15.00    Pack years: 1.50    Quit date: 07/29/2004    Years since quitting: 16.1  . Smokeless tobacco: Never Used  Substance Use Topics  . Alcohol use: No    Alcohol/week: 0.0 standard drinks     Current Outpatient Medications:  .  ARIPiprazole (ABILIFY) 2 MG tablet, Take 1 tablet (2 mg total) by mouth daily., Disp: 90 tablet, Rfl: 1 .  aspirin 81 MG tablet, Take 81 mg by mouth daily., Disp: , Rfl:  .  dapagliflozin propanediol (FARXIGA) 10 MG TABS tablet, Take 1 tablet (10 mg total) by mouth daily before breakfast., Disp: 30 tablet, Rfl: 2 .  EPINEPHRINE, ANAPHYLAXIS THERAPY AGENTS,, EPIPEN 2-PAK, 0.3MG /0.3ML (Injection Device)  1 Device use as directed for 30 days  Quantity: 2;  Refills: 1   Ordered :06-Mar-2012  Alba Cory MD;  Started 06-Mar-2012 Active Comments: DX: 989.5, Disp: , Rfl:  .  insulin glargine, 1 Unit Dial, (TOUJEO SOLOSTAR) 300 UNIT/ML Solostar Pen, Inject 30 Units into the skin daily., Disp: 15 mL, Rfl: 1 .  Insulin Pen Needle (NOVOFINE PEN NEEDLE) 32G X 6 MM MISC, See admin instructions., Disp: , Rfl:  .  loratadine (CLARITIN) 10 MG tablet, Take 1 tablet (10 mg total) by mouth daily., Disp: 30 tablet, Rfl: 11 .  metFORMIN (GLUCOPHAGE-XR) 750 MG 24 hr tablet, Take 2 tablets (1,500 mg total) by mouth daily with breakfast., Disp: 180 tablet, Rfl: 1 .  montelukast (SINGULAIR) 10 MG tablet, TAKE 1 TABLET BY MOUTH AT   BEDTIME, Disp: 90 tablet, Rfl: 0 .  ONE TOUCH LANCETS MISC, 1 each by Does not apply route 2 (two) times daily., Disp: 100 each, Rfl: 1 .  ONE TOUCH ULTRA TEST test strip, USE AS INSTRUCTED TWO TIMES DAILY, Disp: 200 each, Rfl: 2 .  pantoprazole (PROTONIX) 40 MG tablet, Take 1 tablet (40 mg total) by mouth daily., Disp: 90 tablet, Rfl: 3 .  triamcinolone ointment (KENALOG) 0.5 %, Apply 1 application topically 2 (two) times daily., Disp: 60 g, Rfl: 0 .  Ubrogepant (UBRELVY) 100 MG TABS, Take 1 tablet by mouth daily as needed., Disp: 10 tablet, Rfl: 2 .  valACYclovir (VALTREX) 1000 MG tablet, Take 1 tablet (1,000 mg total) by mouth 2 (two) times daily. Prn, Disp: 60 tablet, Rfl: 0 .  valsartan (DIOVAN) 80 MG tablet, Take 1 tablet (80 mg total) by mouth daily., Disp: 90 tablet, Rfl: 1 .  atorvastatin (LIPITOR) 40 MG tablet, Take 1 tablet (  40 mg total) by mouth daily., Disp: 90 tablet, Rfl: 1 .  meloxicam (MOBIC) 15 MG tablet, Take 1 tablet (15 mg total) by mouth as needed. (Patient not taking: No sig reported), Disp: 90 tablet, Rfl: 0 .  sertraline (ZOLOFT) 100 MG tablet, Take 1 tablet (100 mg total) by mouth daily., Disp: 90 tablet, Rfl: 1 .  temazepam (RESTORIL) 30 MG capsule, Take 1 capsule (30 mg total) by mouth daily., Disp: 90 capsule, Rfl: 0 .  Vitamin D, Ergocalciferol, (DRISDOL) 1.25 MG (50000 UNIT) CAPS capsule, Take 1 capsule (50,000 Units total) by mouth every 7 (seven) days., Disp: 12 capsule, Rfl: 3  Allergies  Allergen Reactions  . Ace Inhibitors     cough  . Bee Venom     Bee  . Dapagliflozin     hematuria    I personally reviewed active problem list, medication list, allergies, family history, social history, health maintenance with the patient/caregiver today.   ROS  Constitutional: Negative for fever or weight change.  Respiratory: Negative for cough and shortness of breath.   Cardiovascular: Negative for chest pain or palpitations.  Gastrointestinal: Negative for  abdominal pain, no bowel changes.  Musculoskeletal: Negative for gait problem or joint swelling.  Skin: Negative for rash.  Neurological: Negative for dizziness or headache.  No other specific complaints in a complete review of systems (except as listed in HPI above).  Objective  Vitals:   09/12/20 0820  BP: 132/84  Pulse: 92  Resp: 16  Temp: 98 F (36.7 C)  TempSrc: Oral  SpO2: 99%  Weight: 205 lb 4.8 oz (93.1 kg)  Height: 5\' 7"  (1.702 m)    Body mass index is 32.15 kg/m.  Physical Exam  Constitutional: Patient appears well-developed and well-nourished. Obese No distress.  HEENT: head atraumatic, normocephalic, pupils equal and reactive to light,  neck supple Cardiovascular: Normal rate, regular rhythm and normal heart sounds.  No murmur heard. No BLE edema. Pulmonary/Chest: Effort normal and breath sounds normal. No respiratory distress. Abdominal: Soft.  There is no tenderness. Psychiatric: Patient has a normal mood and affect. behavior is normal. Judgment and thought content normal.  PHQ2/9: Depression screen [facility] 2/9 09/12/2020 06/06/2020 03/06/2020 11/02/2019 08/03/2019  Decreased Interest 0 1 0 0 1  Down, Depressed, Hopeless 0 1 0 0 0  PHQ - 2 Score 0 2 0 0 1  Altered sleeping - 3 0 0 3  Tired, decreased energy - 2 0 0 0  Change in appetite - 1 0 0 0  Feeling bad or failure about yourself  - 0 0 0 0  Trouble concentrating - 0 0 0 0  Moving slowly or fidgety/restless - 0 0 0 0  Suicidal thoughts - 0 0 0 0  PHQ-9 Score - 8 0 0 4  Difficult doing work/chores - Not difficult at all - - Not difficult at all  Some recent data might be hidden    phq 9 is negative  Fall Risk: Fall Risk  09/12/2020 06/06/2020 03/06/2020 11/02/2019 08/03/2019  Falls in the past year? 0 0 0 0 0  Number falls in past yr: 0 0 0 0 0  Injury with Fall? 0 0 0 0 0     Functional Status Survey: Is the patient deaf or have difficulty hearing?: No Does the patient have difficulty seeing, even when  wearing glasses/contacts?: No Does the patient have difficulty concentrating, remembering, or making decisions?: No Does the patient have difficulty walking or climbing stairs?: No Does  the patient have difficulty dressing or bathing?: No Does the patient have difficulty doing errands alone such as visiting a doctor's office or shopping?: No    Assessment & Plan  1. Diabetic polyneuropathy associated with type 2 diabetes mellitus (HCC)  - POCT HgB A1C - dapagliflozin propanediol (FARXIGA) 10 MG TABS tablet; Take 1 tablet (10 mg total) by mouth daily before breakfast.  Dispense: 30 tablet; Refill: 2 - Microalbumin / creatinine urine ratio  2. Dyslipidemia  - atorvastatin (LIPITOR) 40 MG tablet; Take 1 tablet (40 mg total) by mouth daily.  Dispense: 90 tablet; Refill: 1 - Lipid panel  3. OSA on CPAP   4. Benign hypertension  - CBC with Differential/Platelet - Comprehensive metabolic panel  5. Vitamin D deficiency  - Vitamin D, Ergocalciferol, (DRISDOL) 1.25 MG (50000 UNIT) CAPS capsule; Take 1 capsule (50,000 Units total) by mouth every 7 (seven) days.  Dispense: 12 capsule; Refill: 3 - VITAMIN D 25 Hydroxy (Vit-D Deficiency, Fractures)  6. Insomnia, persistent  - temazepam (RESTORIL) 30 MG capsule; Take 1 capsule (30 mg total) by mouth daily.  Dispense: 90 capsule; Refill: 0  7. Hypertension associated with diabetes (HCC)   8. Gastroesophageal reflux disease without esophagitis   9. Migraine without aura and without status migrainosus, not intractable   10. Morbid obesity (HCC)  Discussed with the patient the risk posed by an increased BMI. Discussed importance of portion control, calorie counting and at least 150 minutes of physical activity weekly. Avoid sweet beverages and drink more water. Eat at least 6 servings of fruit and vegetables daily   11. Controlled type 2 diabetes with neuropathy (HCC)   12. Dyslipidemia associated with type 2 diabetes mellitus  (HCC)   13. Menopausal symptom  - sertraline (ZOLOFT) 100 MG tablet; Take 1 tablet (100 mg total) by mouth daily.  Dispense: 90 tablet; Refill: 1  14. Long-term use of high-risk medication  - Vitamin B12  15. Need for shingles vaccine  - Varicella-zoster vaccine IM

## 2020-09-12 ENCOUNTER — Encounter: Payer: Self-pay | Admitting: Family Medicine

## 2020-09-12 ENCOUNTER — Other Ambulatory Visit: Payer: Self-pay

## 2020-09-12 ENCOUNTER — Ambulatory Visit: Payer: Managed Care, Other (non HMO) | Admitting: Family Medicine

## 2020-09-12 VITALS — BP 132/84 | HR 92 | Temp 98.0°F | Resp 16 | Ht 67.0 in | Wt 205.3 lb

## 2020-09-12 DIAGNOSIS — E559 Vitamin D deficiency, unspecified: Secondary | ICD-10-CM

## 2020-09-12 DIAGNOSIS — G4733 Obstructive sleep apnea (adult) (pediatric): Secondary | ICD-10-CM

## 2020-09-12 DIAGNOSIS — Z23 Encounter for immunization: Secondary | ICD-10-CM | POA: Diagnosis not present

## 2020-09-12 DIAGNOSIS — Z9989 Dependence on other enabling machines and devices: Secondary | ICD-10-CM

## 2020-09-12 DIAGNOSIS — E785 Hyperlipidemia, unspecified: Secondary | ICD-10-CM

## 2020-09-12 DIAGNOSIS — K219 Gastro-esophageal reflux disease without esophagitis: Secondary | ICD-10-CM

## 2020-09-12 DIAGNOSIS — I10 Essential (primary) hypertension: Secondary | ICD-10-CM | POA: Diagnosis not present

## 2020-09-12 DIAGNOSIS — G43009 Migraine without aura, not intractable, without status migrainosus: Secondary | ICD-10-CM

## 2020-09-12 DIAGNOSIS — N951 Menopausal and female climacteric states: Secondary | ICD-10-CM

## 2020-09-12 DIAGNOSIS — E1159 Type 2 diabetes mellitus with other circulatory complications: Secondary | ICD-10-CM

## 2020-09-12 DIAGNOSIS — I152 Hypertension secondary to endocrine disorders: Secondary | ICD-10-CM

## 2020-09-12 DIAGNOSIS — E1140 Type 2 diabetes mellitus with diabetic neuropathy, unspecified: Secondary | ICD-10-CM

## 2020-09-12 DIAGNOSIS — E1169 Type 2 diabetes mellitus with other specified complication: Secondary | ICD-10-CM

## 2020-09-12 DIAGNOSIS — Z79899 Other long term (current) drug therapy: Secondary | ICD-10-CM

## 2020-09-12 DIAGNOSIS — G4700 Insomnia, unspecified: Secondary | ICD-10-CM

## 2020-09-12 DIAGNOSIS — E1142 Type 2 diabetes mellitus with diabetic polyneuropathy: Secondary | ICD-10-CM | POA: Diagnosis not present

## 2020-09-12 LAB — POCT GLYCOSYLATED HEMOGLOBIN (HGB A1C): Hemoglobin A1C: 7.9 % — AB (ref 4.0–5.6)

## 2020-09-12 MED ORDER — VITAMIN D (ERGOCALCIFEROL) 1.25 MG (50000 UNIT) PO CAPS: 50000.0000 [IU] | ORAL_CAPSULE | ORAL | 3 refills | Status: DC

## 2020-09-12 MED ORDER — SERTRALINE HCL 100 MG PO TABS: 100.0000 mg | ORAL_TABLET | Freq: Every day | ORAL | 1 refills | Status: DC

## 2020-09-12 MED ORDER — DAPAGLIFLOZIN PROPANEDIOL 10 MG PO TABS: 10.0000 mg | ORAL_TABLET | Freq: Every day | ORAL | 2 refills | Status: DC

## 2020-09-12 MED ORDER — TEMAZEPAM 30 MG PO CAPS: 30.0000 mg | ORAL_CAPSULE | Freq: Every day | ORAL | 0 refills | Status: DC

## 2020-09-12 MED ORDER — ATORVASTATIN CALCIUM 40 MG PO TABS: 40.0000 mg | ORAL_TABLET | Freq: Every day | ORAL | 1 refills | Status: DC

## 2020-09-12 NOTE — Addendum Note (Signed)
Addended by: Ruel Favors on: 09/12/2020 08:59 AM   Modules accepted: Orders

## 2020-10-03 LAB — CBC WITH DIFFERENTIAL/PLATELET
Basophils Absolute: 0 10*3/uL (ref 0.0–0.2)
Basos: 0 %
EOS (ABSOLUTE): 0.2 10*3/uL (ref 0.0–0.4)
Eos: 2 %
Hematocrit: 38.4 % (ref 34.0–46.6)
Hemoglobin: 12.4 g/dL (ref 11.1–15.9)
Immature Grans (Abs): 0 10*3/uL (ref 0.0–0.1)
Immature Granulocytes: 0 %
Lymphocytes Absolute: 2.3 10*3/uL (ref 0.7–3.1)
Lymphs: 31 %
MCH: 26.7 pg (ref 26.6–33.0)
MCHC: 32.3 g/dL (ref 31.5–35.7)
MCV: 83 fL (ref 79–97)
Monocytes Absolute: 0.5 10*3/uL (ref 0.1–0.9)
Monocytes: 7 %
Neutrophils Absolute: 4.4 10*3/uL (ref 1.4–7.0)
Neutrophils: 60 %
Platelets: 367 10*3/uL (ref 150–450)
RBC: 4.65 x10E6/uL (ref 3.77–5.28)
RDW: 14 % (ref 11.7–15.4)
WBC: 7.5 10*3/uL (ref 3.4–10.8)

## 2020-10-03 LAB — COMPREHENSIVE METABOLIC PANEL
ALT: 16 IU/L (ref 0–32)
AST: 13 IU/L (ref 0–40)
Albumin/Globulin Ratio: 1.7 (ref 1.2–2.2)
Albumin: 4.5 g/dL (ref 3.8–4.9)
Alkaline Phosphatase: 137 IU/L — ABNORMAL HIGH (ref 44–121)
BUN/Creatinine Ratio: 14 (ref 9–23)
BUN: 12 mg/dL (ref 6–24)
Bilirubin Total: 0.2 mg/dL (ref 0.0–1.2)
CO2: 21 mmol/L (ref 20–29)
Calcium: 9.6 mg/dL (ref 8.7–10.2)
Chloride: 105 mmol/L (ref 96–106)
Creatinine, Ser: 0.86 mg/dL (ref 0.57–1.00)
Globulin, Total: 2.6 g/dL (ref 1.5–4.5)
Glucose: 207 mg/dL — ABNORMAL HIGH (ref 65–99)
Potassium: 4.1 mmol/L (ref 3.5–5.2)
Sodium: 142 mmol/L (ref 134–144)
Total Protein: 7.1 g/dL (ref 6.0–8.5)
eGFR: 78 mL/min/{1.73_m2} (ref >59)

## 2020-10-03 LAB — LIPID PANEL
Chol/HDL Ratio: 2.8 ratio (ref 0.0–4.4)
Cholesterol, Total: 158 mg/dL (ref 100–199)
HDL: 57 mg/dL (ref >39)
LDL Chol Calc (NIH): 85 mg/dL (ref 0–99)
Triglycerides: 84 mg/dL (ref 0–149)
VLDL Cholesterol Cal: 16 mg/dL (ref 5–40)

## 2020-10-03 LAB — MICROALBUMIN / CREATININE URINE RATIO
Creatinine, Urine: 97.6 mg/dL
Microalb/Creat Ratio: 4 mg/g creat (ref 0–29)
Microalbumin, Urine: 4.1 ug/mL

## 2020-10-03 LAB — VITAMIN D 25 HYDROXY (VIT D DEFICIENCY, FRACTURES): Vit D, 25-Hydroxy: 49.7 ng/mL (ref 30.0–100.0)

## 2020-10-03 LAB — VITAMIN B12: Vitamin B-12: 232 pg/mL (ref 232–1245)

## 2020-10-07 ENCOUNTER — Other Ambulatory Visit: Payer: Self-pay | Admitting: Family Medicine

## 2020-10-07 ENCOUNTER — Encounter: Payer: Self-pay | Admitting: Family Medicine

## 2020-10-07 DIAGNOSIS — J302 Other seasonal allergic rhinitis: Secondary | ICD-10-CM

## 2020-10-09 ENCOUNTER — Other Ambulatory Visit: Payer: Self-pay | Admitting: Family Medicine

## 2020-10-09 ENCOUNTER — Other Ambulatory Visit: Payer: Self-pay

## 2020-10-09 DIAGNOSIS — E1142 Type 2 diabetes mellitus with diabetic polyneuropathy: Secondary | ICD-10-CM

## 2020-10-09 MED ORDER — MONTELUKAST SODIUM 10 MG PO TABS: 10.0000 mg | ORAL_TABLET | Freq: Every day | ORAL | 1 refills | Status: DC

## 2020-10-09 MED ORDER — DAPAGLIFLOZIN PROPANEDIOL 10 MG PO TABS: 10.0000 mg | ORAL_TABLET | Freq: Every day | ORAL | 1 refills | Status: DC

## 2020-12-11 ENCOUNTER — Ambulatory Visit: Payer: Managed Care, Other (non HMO) | Admitting: Family Medicine

## 2020-12-11 ENCOUNTER — Encounter: Payer: Self-pay | Admitting: Family Medicine

## 2020-12-11 ENCOUNTER — Other Ambulatory Visit: Payer: Self-pay

## 2020-12-11 VITALS — BP 128/72 | HR 92 | Temp 98.3°F | Resp 16 | Ht 67.0 in | Wt 201.0 lb

## 2020-12-11 DIAGNOSIS — E669 Obesity, unspecified: Secondary | ICD-10-CM | POA: Diagnosis not present

## 2020-12-11 DIAGNOSIS — E1169 Type 2 diabetes mellitus with other specified complication: Secondary | ICD-10-CM

## 2020-12-11 DIAGNOSIS — E559 Vitamin D deficiency, unspecified: Secondary | ICD-10-CM

## 2020-12-11 DIAGNOSIS — E1159 Type 2 diabetes mellitus with other circulatory complications: Secondary | ICD-10-CM

## 2020-12-11 DIAGNOSIS — Z9989 Dependence on other enabling machines and devices: Secondary | ICD-10-CM

## 2020-12-11 DIAGNOSIS — I152 Hypertension secondary to endocrine disorders: Secondary | ICD-10-CM

## 2020-12-11 DIAGNOSIS — Z23 Encounter for immunization: Secondary | ICD-10-CM

## 2020-12-11 DIAGNOSIS — G4733 Obstructive sleep apnea (adult) (pediatric): Secondary | ICD-10-CM | POA: Diagnosis not present

## 2020-12-11 DIAGNOSIS — E1142 Type 2 diabetes mellitus with diabetic polyneuropathy: Secondary | ICD-10-CM | POA: Diagnosis not present

## 2020-12-11 DIAGNOSIS — E785 Hyperlipidemia, unspecified: Secondary | ICD-10-CM

## 2020-12-11 DIAGNOSIS — E538 Deficiency of other specified B group vitamins: Secondary | ICD-10-CM | POA: Diagnosis not present

## 2020-12-11 DIAGNOSIS — G43009 Migraine without aura, not intractable, without status migrainosus: Secondary | ICD-10-CM

## 2020-12-11 DIAGNOSIS — I10 Essential (primary) hypertension: Secondary | ICD-10-CM

## 2020-12-11 DIAGNOSIS — K219 Gastro-esophageal reflux disease without esophagitis: Secondary | ICD-10-CM | POA: Diagnosis not present

## 2020-12-11 DIAGNOSIS — F330 Major depressive disorder, recurrent, mild: Secondary | ICD-10-CM

## 2020-12-11 DIAGNOSIS — M79651 Pain in right thigh: Secondary | ICD-10-CM

## 2020-12-11 DIAGNOSIS — G4700 Insomnia, unspecified: Secondary | ICD-10-CM

## 2020-12-11 DIAGNOSIS — E66811 Obesity, class 1: Secondary | ICD-10-CM

## 2020-12-11 LAB — POCT GLYCOSYLATED HEMOGLOBIN (HGB A1C): Hemoglobin A1C: 10.3 % — AB (ref 4.0–5.6)

## 2020-12-11 MED ORDER — FREESTYLE LIBRE 2 SENSOR MISC: 1.0000 | 3 refills | Status: DC

## 2020-12-11 MED ORDER — CYANOCOBALAMIN 1000 MCG/ML IJ SOLN
1000.0000 ug | Freq: Once | INTRAMUSCULAR | Status: AC
  Administered 2020-12-11: 1000 ug via INTRAMUSCULAR

## 2020-12-11 MED ORDER — TEMAZEPAM 30 MG PO CAPS: 30.0000 mg | ORAL_CAPSULE | Freq: Every day | ORAL | 0 refills | Status: DC

## 2020-12-11 MED ORDER — VALSARTAN 80 MG PO TABS: 80.0000 mg | ORAL_TABLET | Freq: Every day | ORAL | 1 refills | Status: DC

## 2020-12-11 MED ORDER — ROSUVASTATIN CALCIUM 40 MG PO TABS: 40.0000 mg | ORAL_TABLET | Freq: Every day | ORAL | 3 refills | Status: DC

## 2020-12-11 MED ORDER — METFORMIN HCL ER 750 MG PO TB24: 1500.0000 mg | ORAL_TABLET | Freq: Every day | ORAL | 1 refills | Status: DC

## 2020-12-11 MED ORDER — ARIPIPRAZOLE 2 MG PO TABS: 2.0000 mg | ORAL_TABLET | Freq: Every day | ORAL | 1 refills | Status: DC

## 2020-12-11 NOTE — Progress Notes (Signed)
Name: Joyce Blackburn   MRN: [id number]    DOB: 05-17-1961   Date:12/11/2020       Progress Note  Subjective  Chief Complaint  Follow Up  HPI  DMII: Shehas not beenchecking fsbson a regular basis, she also stopped using Toujeo months ago She denies polyphagia, polydipsia or polyuria. She is only on Metformin 1500 mg and Farxiga at this time, A1C is very high ( over 10 ) and she is willing to resume toujeo again. She has not been on GLP-1 agonist because Ozempic caused bloating and rotten taste in her mouth. .Taking ARB and statin therapy , she states neuropathy is better, symptoms no longer daily. She denies polyphagia, she has polydipsia and polyuria. She has dyslipidemia and obesity   GERD: she also has a history of gastric ulcerover 20years ago. She states occasionally still has nausea with certain smells, like grease. She was seen by Dr. Durwin Reges and had multiple labs done for evaluation of fatty liver, HIDA was normal. She is onPantoprazole ( Dexilant no longer covered), she stopped Ozempic and symptoms improved   OSA:she has been wearing CPAP every night, she still wakes up with a headache lately. We will send her for another study   Dyslipidemia: taking Lipitor and is tolerating it well. No myalgia or chest pain.last LDL was not at goal, we will change to Crestor   HTN: bp is at goal,  but denies pain, palpitation or dizziness   Vitamin D deficiency:level was low, we will recheck labs today   Menopause/major depressionmild: she states Zoloft100 mgand states hot flashes is still present but night sweats has improvedshe is doing well, she was feeling more emotional and we added Abilify back in 02/2020. She states medication helps, she states still grieving. Discussed grieving counseling   Insomnia: she is taking Temazepam and it helps her fall and stay asleep, no side effects of medications   Obesity : she is trying to follow a diabetic diet, she lost a few pounds,  but wondering if it is due to uncontrolled DM  Migraine headaches:  Episodes are starting suddenly associated with nausea and vomiting. She denies any recent episodes, none in the past month. She states Roselyn Meier works but makes her feel sleepy   B12 deficiency: no paresthesia, she will get a B12 today and also start B12 sublingual after that   Right thigh pain seen by Dr. Sabra Heck and chiropractor, ortho states not her hip, she has been doing stretches and seems to help. Pain is intermittent   Patient Active Problem List   Diagnosis Date Noted  . Major depression in remission ([place]) 12/19/2017  . Fatty liver 03/04/2016  . Vitamin D deficiency 03/04/2016  . Gastroesophageal reflux disease without esophagitis 07/04/2015  . Diabetic polyneuropathy associated with type 2 diabetes mellitus ([place]) 03/03/2015  . Peripheral neuropathy 03/03/2015  . Allergic to bees 02/20/2015  . Benign hypertension 02/20/2015  . Insomnia, persistent 02/20/2015  . Chronic idiopathic constipation 02/20/2015  . Dyslipidemia 02/20/2015  . Dermatitis, eczematoid 02/20/2015  . H/O gastric ulcer 02/20/2015  . Herpes 02/20/2015  . Migraine without aura and responsive to treatment 02/20/2015  . Morbid obesity ([place]) 02/20/2015  . Obstructive apnea 02/20/2015  . Paresthesia of arm 02/20/2015  . Periodic limb movement 02/20/2015  . Allergic rhinitis, seasonal 02/20/2015  . Menopausal symptom 02/20/2015    Past Surgical History:  Procedure Laterality Date  . ABDOMINAL HYSTERECTOMY    . COLONOSCOPY WITH PROPOFOL N/A 05/26/2020   Procedure: COLONOSCOPY WITH  PROPOFOL;  Surgeon: Jonathon Bellows, MD;  Location: [facility] ENDOSCOPY;  Service: Gastroenterology;  Laterality: N/A;  . RETINAL TEAR REPAIR CRYOTHERAPY Right    [place] (Dr. Noel Journey)    Family History  Problem Relation Age of Onset  . Mental illness Mother   . Diabetes Mother   . CVA Mother   . Schizophrenia Mother   . Diabetes Sister   . Hypertension Sister    . Diabetes Brother   . Mental illness Brother   . Bipolar disorder Brother   . Schizophrenia Brother     Social History   Tobacco Use  . Smoking status: Former Smoker    Packs/day: 0.10    Years: 15.00    Pack years: 1.50    Quit date: 07/29/2004    Years since quitting: 16.3  . Smokeless tobacco: Never Used  Substance Use Topics  . Alcohol use: No    Alcohol/week: 0.0 standard drinks     Current Outpatient Medications:  .  aspirin 81 MG tablet, Take 81 mg by mouth daily., Disp: , Rfl:  .  Continuous Blood Gluc Sensor (FREESTYLE LIBRE 2 SENSOR) MISC, 1 each by Does not apply route every 14 (fourteen) days., Disp: 6 each, Rfl: 3 .  dapagliflozin propanediol (FARXIGA) 10 MG TABS tablet, Take 1 tablet (10 mg total) by mouth daily before breakfast., Disp: 90 tablet, Rfl: 1 .  EPINEPHRINE, ANAPHYLAXIS THERAPY AGENTS,, EPIPEN 2-PAK, 0.3MG/0.3ML (Injection Device)  1 Device use as directed for 30 days  Quantity: 2;  Refills: 1   Ordered :06-Mar-2012  Steele Sizer MD;  Marisa Sprinkles Active Comments: DX: 989.5, Disp: , Rfl:  .  insulin glargine, 1 Unit Dial, (TOUJEO SOLOSTAR) 300 UNIT/ML Solostar Pen, Inject 30 Units into the skin daily., Disp: 15 mL, Rfl: 1 .  Insulin Pen Needle (NOVOFINE PEN NEEDLE) 32G X 6 MM MISC, See admin instructions., Disp: , Rfl:  .  loratadine (CLARITIN) 10 MG tablet, Take 1 tablet (10 mg total) by mouth daily., Disp: 30 tablet, Rfl: 11 .  meloxicam (MOBIC) 15 MG tablet, Take 1 tablet (15 mg total) by mouth as needed., Disp: 90 tablet, Rfl: 0 .  montelukast (SINGULAIR) 10 MG tablet, Take 1 tablet (10 mg total) by mouth at bedtime., Disp: 90 tablet, Rfl: 1 .  ONE TOUCH LANCETS MISC, 1 each by Does not apply route 2 (two) times daily., Disp: 100 each, Rfl: 1 .  ONE TOUCH ULTRA TEST test strip, USE AS INSTRUCTED TWO TIMES DAILY, Disp: 200 each, Rfl: 2 .  pantoprazole (PROTONIX) 40 MG tablet, Take 1 tablet (40 mg total) by mouth daily., Disp: 90 tablet, Rfl:  3 .  rosuvastatin (CRESTOR) 40 MG tablet, Take 1 tablet (40 mg total) by mouth daily. In place of Atorvastatin, Disp: 90 tablet, Rfl: 3 .  sertraline (ZOLOFT) 100 MG tablet, Take 1 tablet (100 mg total) by mouth daily., Disp: 90 tablet, Rfl: 1 .  triamcinolone ointment (KENALOG) 0.5 %, Apply 1 application topically 2 (two) times daily., Disp: 60 g, Rfl: 0 .  Ubrogepant (UBRELVY) 100 MG TABS, Take 1 tablet by mouth daily as needed., Disp: 10 tablet, Rfl: 2 .  valACYclovir (VALTREX) 1000 MG tablet, Take 1 tablet (1,000 mg total) by mouth 2 (two) times daily. Prn, Disp: 60 tablet, Rfl: 0 .  Vitamin D, Ergocalciferol, (DRISDOL) 1.25 MG (50000 UNIT) CAPS capsule, Take 1 capsule (50,000 Units total) by mouth every 7 (seven) days., Disp: 12 capsule, Rfl: 3 .  ARIPiprazole (ABILIFY) 2  MG tablet, Take 1 tablet (2 mg total) by mouth daily., Disp: 90 tablet, Rfl: 1 .  metFORMIN (GLUCOPHAGE-XR) 750 MG 24 hr tablet, Take 2 tablets (1,500 mg total) by mouth daily with breakfast., Disp: 180 tablet, Rfl: 1 .  temazepam (RESTORIL) 30 MG capsule, Take 1 capsule (30 mg total) by mouth daily., Disp: 90 capsule, Rfl: 0 .  valsartan (DIOVAN) 80 MG tablet, Take 1 tablet (80 mg total) by mouth daily., Disp: 90 tablet, Rfl: 1  Current Facility-Administered Medications:  .  cyanocobalamin ((VITAMIN B-12)) injection 1,000 mcg, 1,000 mcg, Intramuscular, Once, Steele Sizer, MD  Allergies  Allergen Reactions  . Ace Inhibitors     cough  . Bee Venom     Bee  . Dapagliflozin     hematuria    I personally reviewed active problem list, medication list, allergies, family history, social history, health maintenance, notes from last encounter with the patient/caregiver today.   ROS  Constitutional: Negative for fever or weight change.  Respiratory: Negative for cough and shortness of breath.   Cardiovascular: Negative for chest pain or palpitations.  Gastrointestinal: Negative for abdominal pain, no bowel changes.   Musculoskeletal: Negative for gait problem or joint swelling.  Skin: Negative for rash.  Neurological: Negative for dizziness or headache.  No other specific complaints in a complete review of systems (except as listed in HPI above).  Objective  Vitals:   12/11/20 0819  BP: 128/72  Pulse: 92  Resp: 16  Temp: 98.3 F (36.8 C)  TempSrc: Oral  SpO2: 97%  Weight: 201 lb (91.2 kg)  Height: '5\' 7"'  (1.702 m)    Body mass index is 31.48 kg/m.  Physical Exam  Constitutional: Patient appears well-developed and well-nourished. Obese  No distress.  HEENT: head atraumatic, normocephalic, pupils equal and reactive to light,  neck supple Cardiovascular: Normal rate, regular rhythm and normal heart sounds.  No murmur heard. No BLE edema. Pulmonary/Chest: Effort normal and breath sounds normal. No respiratory distress. Abdominal: Soft.  There is no tenderness. Psychiatric: Patient has a normal mood and affect. behavior is normal. Judgment and thought content normal.  Recent Results (from the past 2160 hour(s))  POCT HgB A1C     Status: Abnormal   Collection Time: 09/12/20  8:39 AM  Result Value Ref Range   Hemoglobin A1C 7.9 (A) 4.0 - 5.6 %   HbA1c POC (<> result, manual entry)     HbA1c, POC (prediabetic range)     HbA1c, POC (controlled diabetic range)    Lipid panel     Status: None   Collection Time: 10/02/20  8:17 AM  Result Value Ref Range   Cholesterol, Total 158 100 - 199 mg/dL   Triglycerides 84 0 - 149 mg/dL   HDL 57 >39 mg/dL   VLDL Cholesterol Cal 16 5 - 40 mg/dL   LDL Chol Calc (NIH) 85 0 - 99 mg/dL   Chol/HDL Ratio 2.8 0.0 - 4.4 ratio    Comment:                                   T. Chol/HDL Ratio                                             Men  Women  1/2 Avg.Risk  3.4    3.3                                   Avg.Risk  5.0    4.4                                2X Avg.Risk  9.6    7.1                                3X Avg.Risk 23.4    11.0   Microalbumin / creatinine urine ratio     Status: None   Collection Time: 10/02/20  8:17 AM  Result Value Ref Range   Creatinine, Urine 97.6 Not Estab. mg/dL   Microalbumin, Urine 4.1 Not Estab. ug/mL   Microalb/Creat Ratio 4 0 - 29 mg/g creat    Comment:                        Normal:                0 -  29                        Moderately increased: 30 - 300                        Severely increased:       >300   CBC with Differential/Platelet     Status: None   Collection Time: 10/02/20  8:17 AM  Result Value Ref Range   WBC 7.5 3.4 - 10.8 x10E3/uL   RBC 4.65 3.77 - 5.28 x10E6/uL   Hemoglobin 12.4 11.1 - 15.9 g/dL   Hematocrit 38.4 34.0 - 46.6 %   MCV 83 79 - 97 fL   MCH 26.7 26.6 - 33.0 pg   MCHC 32.3 31.5 - 35.7 g/dL   RDW 14.0 11.7 - 15.4 %   Platelets 367 150 - 450 x10E3/uL   Neutrophils 60 Not Estab. %   Lymphs 31 Not Estab. %   Monocytes 7 Not Estab. %   Eos 2 Not Estab. %   Basos 0 Not Estab. %   Neutrophils Absolute 4.4 1.4 - 7.0 x10E3/uL   Lymphocytes Absolute 2.3 0.7 - 3.1 x10E3/uL   Monocytes Absolute 0.5 0.1 - 0.9 x10E3/uL   EOS (ABSOLUTE) 0.2 0.0 - 0.4 x10E3/uL   Basophils Absolute 0.0 0.0 - 0.2 x10E3/uL   Immature Granulocytes 0 Not Estab. %   Immature Grans (Abs) 0.0 0.0 - 0.1 x10E3/uL  VITAMIN D 25 Hydroxy (Vit-D Deficiency, Fractures)     Status: None   Collection Time: 10/02/20  8:17 AM  Result Value Ref Range   Vit D, 25-Hydroxy 49.7 30.0 - 100.0 ng/mL    Comment: Vitamin D deficiency has been defined by the Institute of Medicine and an Endocrine Society practice guideline as a level of serum 25-OH vitamin D less than 20 ng/mL (1,2). The Endocrine Society went on to further define vitamin D insufficiency as a level between 21 and 29 ng/mL (2). 1. IOM (Institute of Medicine). 2010. Dietary reference    intakes for calcium and D. [place]: The    [employer]. 2. Holick MF, Binkley Coles,  Bischoff-Ferrari HA, et al.     Evaluation, treatment, and prevention of vitamin D    deficiency: an Endocrine Society clinical practice    guideline. JCEM. 2011 Jul; 96(7):1911-30.   Vitamin B12     Status: None   Collection Time: 10/02/20  8:17 AM  Result Value Ref Range   Vitamin B-12 232 232 - 1,245 pg/mL  Comprehensive metabolic panel     Status: Abnormal   Collection Time: 10/02/20  8:17 AM  Result Value Ref Range   Glucose 207 (H) 65 - 99 mg/dL   BUN 12 6 - 24 mg/dL   Creatinine, Ser 0.86 0.57 - 1.00 mg/dL   eGFR 78 >59 mL/min/1.73    Comment: **In accordance with recommendations from the NKF-ASN Task force,**   [facility] has updated its eGFR calculation to the 2021 CKD-EPI   creatinine equation that estimates kidney function without a race   variable.    BUN/Creatinine Ratio 14 9 - 23   Sodium 142 134 - 144 mmol/L   Potassium 4.1 3.5 - 5.2 mmol/L   Chloride 105 96 - 106 mmol/L   CO2 21 20 - 29 mmol/L   Calcium 9.6 8.7 - 10.2 mg/dL   Total Protein 7.1 6.0 - 8.5 g/dL   Albumin 4.5 3.8 - 4.9 g/dL   Globulin, Total 2.6 1.5 - 4.5 g/dL   Albumin/Globulin Ratio 1.7 1.2 - 2.2   Bilirubin Total 0.2 0.0 - 1.2 mg/dL   Alkaline Phosphatase 137 (H) 44 - 121 IU/L   AST 13 0 - 40 IU/L   ALT 16 0 - 32 IU/L  POCT HgB A1C     Status: Abnormal   Collection Time: 12/11/20  8:28 AM  Result Value Ref Range   Hemoglobin A1C 10.3 (A) 4.0 - 5.6 %   HbA1c POC (<> result, manual entry)     HbA1c, POC (prediabetic range)     HbA1c, POC (controlled diabetic range)       PHQ2/9: Depression screen [facility] 2/9 12/11/2020 09/12/2020 06/06/2020 03/06/2020 11/02/2019  Decreased Interest '1 1 1 ' 0 0  Down, Depressed, Hopeless 0 1 1 0 0  PHQ - 2 Score '1 2 2 ' 0 0  Altered sleeping 0 0 3 0 0  Tired, decreased energy 0 0 2 0 0  Change in appetite 0 1 1 0 0  Feeling bad or failure about yourself  0 1 0 0 0  Trouble concentrating 0 0 0 0 0  Moving slowly or fidgety/restless 0 0 0 0 0  Suicidal thoughts 0 0 0 0 0  PHQ-9 Score '1 4 8 ' 0 0   Difficult doing work/chores - - Not difficult at all - -  Some recent data might be hidden    phq 9 is positive   Fall Risk: Fall Risk  12/11/2020 09/12/2020 06/06/2020 03/06/2020 11/02/2019  Falls in the past year? 0 0 0 0 0  Number falls in past yr: 0 0 0 0 0  Injury with Fall? 0 0 0 0 0     Functional Status Survey: Is the patient deaf or have difficulty hearing?: No Does the patient have difficulty seeing, even when wearing glasses/contacts?: No Does the patient have difficulty concentrating, remembering, or making decisions?: No Does the patient have difficulty walking or climbing stairs?: No Does the patient have difficulty dressing or bathing?: No Does the patient have difficulty doing errands alone such as visiting a doctor's office or shopping?: No    Assessment & Plan  1. Diabetic polyneuropathy associated with type 2 diabetes mellitus (HCC)  - POCT HgB A1C - metFORMIN (GLUCOPHAGE-XR) 750 MG 24 hr tablet; Take 2 tablets (1,500 mg total) by mouth daily with breakfast.  Dispense: 180 tablet; Refill: 1 - Continuous Blood Gluc Sensor (FREESTYLE LIBRE 2 SENSOR) MISC; 1 each by Does not apply route every 14 (fourteen) days.  Dispense: 6 each; Refill: 3  2. Obesity   Discussed with the patient the risk posed by an increased BMI. Discussed importance of portion control, calorie counting and at least 150 minutes of physical activity weekly. Avoid sweet beverages and drink more water. Eat at least 6 servings of fruit and vegetables daily   3. Gastroesophageal reflux disease without esophagitis   4. OSA on CPAP   5. Vitamin D deficiency   6. Benign hypertension  - valsartan (DIOVAN) 80 MG tablet; Take 1 tablet (80 mg total) by mouth daily.  Dispense: 90 tablet; Refill: 1  7. Hypertension associated with diabetes ([place])  - valsartan (DIOVAN) 80 MG tablet; Take 1 tablet (80 mg total) by mouth daily.  Dispense: 90 tablet; Refill: 1  8. Migraine without aura and without  status migrainosus, not intractable   9. Dyslipidemia associated with type 2 diabetes mellitus (HCC)  - rosuvastatin (CRESTOR) 40 MG tablet; Take 1 tablet (40 mg total) by mouth daily. In place of Atorvastatin  Dispense: 90 tablet; Refill: 3  10. Mild recurrent major depression (HCC)  - ARIPiprazole (ABILIFY) 2 MG tablet; Take 1 tablet (2 mg total) by mouth daily.  Dispense: 90 tablet; Refill: 1  11. B12 deficiency  - cyanocobalamin ((VITAMIN B-12)) injection 1,000 mcg  12. Insomnia, persistent  - temazepam (RESTORIL) 30 MG capsule; Take 1 capsule (30 mg total) by mouth daily.  Dispense: 90 capsule; Refill: 0  13. Right thigh pain   14. Need for shingles vaccine  - Varicella-zoster vaccine IM

## 2021-01-07 ENCOUNTER — Encounter: Payer: Self-pay | Admitting: Family Medicine

## 2021-01-08 MED ORDER — FLUCONAZOLE 150 MG PO TABS: 150.0000 mg | ORAL_TABLET | ORAL | 0 refills | Status: DC

## 2021-01-25 ENCOUNTER — Encounter: Payer: Self-pay | Admitting: Family Medicine

## 2021-01-26 ENCOUNTER — Encounter: Payer: Self-pay | Admitting: Family Medicine

## 2021-01-26 ENCOUNTER — Ambulatory Visit: Payer: Managed Care, Other (non HMO) | Admitting: Family Medicine

## 2021-01-26 ENCOUNTER — Other Ambulatory Visit: Payer: Self-pay

## 2021-01-26 VITALS — BP 142/82 | HR 82 | Temp 98.0°F | Resp 16 | Ht 67.0 in | Wt 203.0 lb

## 2021-01-26 DIAGNOSIS — E1169 Type 2 diabetes mellitus with other specified complication: Secondary | ICD-10-CM | POA: Diagnosis not present

## 2021-01-26 DIAGNOSIS — E785 Hyperlipidemia, unspecified: Secondary | ICD-10-CM

## 2021-01-26 DIAGNOSIS — M5431 Sciatica, right side: Secondary | ICD-10-CM | POA: Diagnosis not present

## 2021-01-26 MED ORDER — METHYLPREDNISOLONE 4 MG PO TBPK: ORAL_TABLET | ORAL | 0 refills | Status: DC

## 2021-01-26 MED ORDER — ACETAMINOPHEN-CODEINE #3 300-30 MG PO TABS: 1.0000 | ORAL_TABLET | ORAL | 0 refills | Status: DC | PRN

## 2021-01-26 NOTE — Progress Notes (Signed)
Name: Joyce Blackburn   MRN: [id number]    DOB: 1960-10-13   Date:01/26/2021       Progress Note  Subjective  Chief Complaint  Back Pain  HPI  Right sciatica: she has a history of back pain that is intermittent, however it was getting progressively worse , she went to see Dr. Jeannetta Ellis for the first time last Monday and started to have adjustments, she had an adjustment on Monday, on Tuesday the pain intensified , went back on Wednesday because she had difficulty walking and could not drive, she had to take off work the past two days. Pain 10/10, no pain on her back just on right quad described as pin and needles, sometimes sharp, throbbing. Feels like a toothache worse with movement and better when being still. No bowel or bladder incontinence. She has been taking Aleve   She has DM, fsbs has been  stable, highest 210 after a meal, lowest of 127 , most of the time around 160. Explained prednisone will raise glucose levels, must follow a very restrictive diabetic diet    Patient Active Problem List   Diagnosis Date Noted   Major depression in remission (HCC) 12/19/2017   Fatty liver 03/04/2016   Vitamin D deficiency 03/04/2016   Gastroesophageal reflux disease without esophagitis 07/04/2015   Diabetic polyneuropathy associated with type 2 diabetes mellitus (HCC) 03/03/2015   Peripheral neuropathy 03/03/2015   Allergic to bees 02/20/2015   Benign hypertension 02/20/2015   Insomnia, persistent 02/20/2015   Chronic idiopathic constipation 02/20/2015   Dyslipidemia 02/20/2015   Dermatitis, eczematoid 02/20/2015   H/O gastric ulcer 02/20/2015   Herpes 02/20/2015   Migraine without aura and responsive to treatment 02/20/2015   Morbid obesity (HCC) 02/20/2015   Obstructive apnea 02/20/2015   Paresthesia of arm 02/20/2015   Periodic limb movement 02/20/2015   Allergic rhinitis, seasonal 02/20/2015   Menopausal symptom 02/20/2015    Past Surgical History:  Procedure Laterality Date    ABDOMINAL HYSTERECTOMY     COLONOSCOPY WITH PROPOFOL N/A 05/26/2020   Procedure: COLONOSCOPY WITH PROPOFOL;  Surgeon: Wyline Mood, MD;  Location: [facility] ENDOSCOPY;  Service: Gastroenterology;  Laterality: N/A;   RETINAL TEAR REPAIR CRYOTHERAPY Right    [facility] (Dr. Robin Searing)    Family History  Problem Relation Age of Onset   Mental illness Mother    Diabetes Mother    CVA Mother    Schizophrenia Mother    Diabetes Sister    Hypertension Sister    Diabetes Brother    Mental illness Brother    Bipolar disorder Brother    Schizophrenia Brother     Social History   Tobacco Use   Smoking status: Former    Packs/day: 0.10    Years: 15.00    Pack years: 1.50    Types: Cigarettes    Quit date: 07/29/2004    Years since quitting: 16.5   Smokeless tobacco: Never  Substance Use Topics   Alcohol use: No    Alcohol/week: 0.0 standard drinks     Current Outpatient Medications:    ARIPiprazole (ABILIFY) 2 MG tablet, Take 1 tablet (2 mg total) by mouth daily., Disp: 90 tablet, Rfl: 1   aspirin 81 MG tablet, Take 81 mg by mouth daily., Disp: , Rfl:    Continuous Blood Gluc Sensor (FREESTYLE LIBRE 2 SENSOR) MISC, 1 each by Does not apply route every 14 (fourteen) days., Disp: 6 each, Rfl: 3   dapagliflozin propanediol (FARXIGA) 10 MG TABS tablet, Take  1 tablet (10 mg total) by mouth daily before breakfast., Disp: 90 tablet, Rfl: 1   EPINEPHRINE, ANAPHYLAXIS THERAPY AGENTS,, EPIPEN 2-PAK, 0.3MG /0.3ML (Injection Device)  1 Device use as directed for 30 days  Quantity: 2;  Refills: 1   Ordered :06-Mar-2012  Alba Cory MD;  Started 06-Mar-2012 Active Comments: DX: 989.5, Disp: , Rfl:    fluconazole (DIFLUCAN) 150 MG tablet, Take 1 tablet (150 mg total) by mouth every other day., Disp: 3 tablet, Rfl: 0   insulin glargine, 1 Unit Dial, (TOUJEO SOLOSTAR) 300 UNIT/ML Solostar Pen, Inject 30 Units into the skin daily., Disp: 15 mL, Rfl: 1   Insulin Pen Needle (NOVOFINE PEN NEEDLE) 32G X 6 MM  MISC, See admin instructions., Disp: , Rfl:    loratadine (CLARITIN) 10 MG tablet, Take 1 tablet (10 mg total) by mouth daily., Disp: 30 tablet, Rfl: 11   metFORMIN (GLUCOPHAGE-XR) 750 MG 24 hr tablet, Take 2 tablets (1,500 mg total) by mouth daily with breakfast., Disp: 180 tablet, Rfl: 1   montelukast (SINGULAIR) 10 MG tablet, Take 1 tablet (10 mg total) by mouth at bedtime., Disp: 90 tablet, Rfl: 1   ONE TOUCH LANCETS MISC, 1 each by Does not apply route 2 (two) times daily., Disp: 100 each, Rfl: 1   ONE TOUCH ULTRA TEST test strip, USE AS INSTRUCTED TWO TIMES DAILY, Disp: 200 each, Rfl: 2   pantoprazole (PROTONIX) 40 MG tablet, Take 1 tablet (40 mg total) by mouth daily., Disp: 90 tablet, Rfl: 3   rosuvastatin (CRESTOR) 40 MG tablet, Take 1 tablet (40 mg total) by mouth daily. In place of Atorvastatin, Disp: 90 tablet, Rfl: 3   temazepam (RESTORIL) 30 MG capsule, Take 1 capsule (30 mg total) by mouth daily., Disp: 90 capsule, Rfl: 0   triamcinolone ointment (KENALOG) 0.5 %, Apply 1 application topically 2 (two) times daily., Disp: 60 g, Rfl: 0   valsartan (DIOVAN) 80 MG tablet, Take 1 tablet (80 mg total) by mouth daily., Disp: 90 tablet, Rfl: 1   Vitamin D, Ergocalciferol, (DRISDOL) 1.25 MG (50000 UNIT) CAPS capsule, Take 1 capsule (50,000 Units total) by mouth every 7 (seven) days., Disp: 12 capsule, Rfl: 3   meloxicam (MOBIC) 15 MG tablet, Take 1 tablet (15 mg total) by mouth as needed. (Patient not taking: Reported on 01/26/2021), Disp: 90 tablet, Rfl: 0   sertraline (ZOLOFT) 100 MG tablet, Take 1 tablet (100 mg total) by mouth daily. (Patient not taking: Reported on 01/26/2021), Disp: 90 tablet, Rfl: 1   Ubrogepant (UBRELVY) 100 MG TABS, Take 1 tablet by mouth daily as needed. (Patient not taking: Reported on 01/26/2021), Disp: 10 tablet, Rfl: 2   valACYclovir (VALTREX) 1000 MG tablet, Take 1 tablet (1,000 mg total) by mouth 2 (two) times daily. Prn (Patient not taking: Reported on 01/26/2021), Disp:  60 tablet, Rfl: 0  Allergies  Allergen Reactions   Ace Inhibitors     cough   Bee Venom     Bee   Dapagliflozin     hematuria    I personally reviewed active problem list, medication list, allergies, family history, social history, health maintenance with the patient/caregiver today.   ROS  Ten systems reviewed and is negative except as mentioned in HPI   Objective  Vitals:   01/26/21 1304  BP: (!) 142/82  Pulse: 82  Resp: 16  Temp: 98 F (36.7 C)  TempSrc: Oral  SpO2: 98%  Weight: 203 lb (92.1 kg)  Height: 5\' 7"  (1.702 m)  Body mass index is 31.79 kg/m.  Physical Exam  Constitutional: Patient appears well-developed and well-nourished. Obese  No distress.  HEENT: head atraumatic, normocephalic, pupils equal and reactive to light, neck supple Cardiovascular: Normal rate, regular rhythm and normal heart sounds.  No murmur heard. No BLE edema. Pulmonary/Chest: Effort normal and breath sounds normal. No respiratory distress. Abdominal: Soft.  There is no tenderness. Muscular Skeletal: sitting on wheelchair, decrease rom of spine, tender to palpation of right lower back, negative straight leg raise, pain with rom of hip, but not in the groin  Psychiatric: Patient has a normal mood and affect. behavior is normal. Judgment and thought content normal.   Recent Results (from the past 2160 hour(s))  POCT HgB A1C     Status: Abnormal   Collection Time: 12/11/20  8:28 AM  Result Value Ref Range   Hemoglobin A1C 10.3 (A) 4.0 - 5.6 %   HbA1c POC (<> result, manual entry)     HbA1c, POC (prediabetic range)     HbA1c, POC (controlled diabetic range)       PHQ2/9: Depression screen [facility] 2/9 01/26/2021 12/11/2020 09/12/2020 06/06/2020 03/06/2020  Decreased Interest 0 1 1 1  0  Down, Depressed, Hopeless 0 0 1 1 0  PHQ - 2 Score 0 1 2 2  0  Altered sleeping - 0 0 3 0  Tired, decreased energy - 0 0 2 0  Change in appetite - 0 1 1 0  Feeling bad or failure about yourself  - 0 1 0  0  Trouble concentrating - 0 0 0 0  Moving slowly or fidgety/restless - 0 0 0 0  Suicidal thoughts - 0 0 0 0  PHQ-9 Score - 1 4 8  0  Difficult doing work/chores - - - Not difficult at all -  Some recent data might be hidden    phq 9 is negative   Fall Risk: Fall Risk  01/26/2021 12/11/2020 09/12/2020 06/06/2020 03/06/2020  Falls in the past year? 0 0 0 0 0  Number falls in past yr: 0 0 0 0 0  Injury with Fall? 0 0 0 0 0     Functional Status Survey: Is the patient deaf or have difficulty hearing?: No Does the patient have difficulty seeing, even when wearing glasses/contacts?: No Does the patient have difficulty concentrating, remembering, or making decisions?: No Does the patient have difficulty walking or climbing stairs?: Yes Does the patient have difficulty dressing or bathing?: No Does the patient have difficulty doing errands alone such as visiting a doctor's office or shopping?: No    Assessment & Plan  1. Sciatic nerve pain, right  - methylPREDNISolone (MEDROL DOSEPAK) 4 MG TBPK tablet; Take as directed  Dispense: 21 tablet; Refill: 0 - acetaminophen-codeine (TYLENOL #3) 300-30 MG tablet; Take 1 tablet by mouth every 4 (four) hours as needed for moderate pain.  Dispense: 30 tablet; Refill: 0  2. Dyslipidemia associated with type 2 diabetes mellitus (HCC)

## 2021-02-05 ENCOUNTER — Ambulatory Visit: Payer: Self-pay

## 2021-02-05 NOTE — Telephone Encounter (Signed)
Second attempt to reach patient- left message to call office °

## 2021-02-05 NOTE — Telephone Encounter (Signed)
Third attempt to contact patient- left message to call office. Call forwarded for office review.

## 2021-02-05 NOTE — Telephone Encounter (Signed)
Pt called stating that she believes that she is having an allergic reaction to a chicken pox vaccine that she received. She states that she has a rash on her butt and that it has been there for 2 months. Please advise.

## 2021-02-06 ENCOUNTER — Telehealth: Payer: Self-pay

## 2021-02-06 NOTE — Telephone Encounter (Signed)
Will pt be able to do a pap at this appt or will she need an additional appt? Please advise  Copied from CRM [id number]. Topic: Appointment Scheduling - Scheduling Inquiry for Clinic >> Feb 05, 2021 12:52 PM Aretta Nip wrote: Reason for CRM: Pt has an appt on 8/19 @ 8:40. Multiple issues due some allergic reactions but also requesting a PAP Smear at this  sched FU visit. Pls FU with pt re if more time would be needed for visit or additional visit required. Pt was offered an appt additionally  on 8/10 after triage call today but refused appt.

## 2021-03-08 ENCOUNTER — Other Ambulatory Visit: Payer: Self-pay | Admitting: Family Medicine

## 2021-03-08 DIAGNOSIS — E1142 Type 2 diabetes mellitus with diabetic polyneuropathy: Secondary | ICD-10-CM

## 2021-03-08 LAB — LIPID PANEL
Cholesterol: 115 (ref 0–200)
HDL: 46 (ref 35–70)
LDL Cholesterol: 46
LDl/HDL Ratio: 2.5
Triglycerides: 133 (ref 40–160)

## 2021-03-08 LAB — BASIC METABOLIC PANEL: Glucose: 326

## 2021-03-08 LAB — HEMOGLOBIN A1C: Hemoglobin A1C: 10.4

## 2021-03-08 NOTE — Telephone Encounter (Signed)
Requested Prescriptions  Pending Prescriptions Disp Refills  . FARXIGA 10 MG TABS tablet [Pharmacy Med Name: FARXIGA  10MG  TAB] 90 tablet 1    Sig: TAKE 1 TABLET BY MOUTH  DAILY BEFORE BREAKFAST     Endocrinology:  Diabetes - SGLT2 Inhibitors Failed - 03/08/2021  5:11 AM      Failed - HBA1C is between 0 and 7.9 and within 180 days    Hemoglobin A1C  Date Value Ref Range Status  12/11/2020 10.3 (A) 4.0 - 5.6 % Final  02/20/2017 6.7  Final  02/20/2017 6.7  Final   HbA1c, POC (controlled diabetic range)  Date Value Ref Range Status  04/30/2019 6.5 0.0 - 7.0 % Final         Failed - AA eGFR in normal range and within 360 days    GFR calc Af Amer  Date Value Ref Range Status  09/29/2019 92 >59 mL/min/1.73 Final   GFR calc non Af Amer  Date Value Ref Range Status  09/29/2019 80 >59 mL/min/1.73 Final   eGFR  Date Value Ref Range Status  10/02/2020 78 >59 mL/min/1.73 Final    Comment:    **In accordance with recommendations from the NKF-ASN Task force,**   [facility] has updated its eGFR calculation to the 2021 CKD-EPI   creatinine equation that estimates kidney function without a race   variable.          Passed - Cr in normal range and within 360 days    Creatinine, Ser  Date Value Ref Range Status  10/02/2020 0.86 0.57 - 1.00 mg/dL Final         Passed - LDL in normal range and within 360 days    LDL Chol Calc (NIH)  Date Value Ref Range Status  10/02/2020 85 0 - 99 mg/dL Final         Passed - eGFR in normal range and within 360 days    GFR calc Af Amer  Date Value Ref Range Status  09/29/2019 92 >59 mL/min/1.73 Final   GFR calc non Af Amer  Date Value Ref Range Status  09/29/2019 80 >59 mL/min/1.73 Final   eGFR  Date Value Ref Range Status  10/02/2020 78 >59 mL/min/1.73 Final    Comment:    **In accordance with recommendations from the NKF-ASN Task force,**   [facility] has updated its eGFR calculation to the 2021 CKD-EPI   creatinine equation that estimates  kidney function without a race   variable.          Passed - Valid encounter within last 6 months    Recent Outpatient Visits          1 month ago Sciatic nerve pain, right   [facility] [place], Drue Stager, MD   2 months ago Diabetic polyneuropathy associated with type 2 diabetes mellitus [facility])   [facility] [place], Drue Stager, MD   5 months ago Diabetic polyneuropathy associated with type 2 diabetes mellitus [facility])   [facility] Steele Sizer, MD   9 months ago Diabetic polyneuropathy associated with type 2 diabetes mellitus [facility])   [facility] Steele Sizer, MD   1 year ago Controlled type 2 diabetes with neuropathy [facility])   [facility] Steele Sizer, MD      Future Appointments            In 1 week Steele Sizer, MD [facility], [facility]

## 2021-03-14 NOTE — Progress Notes (Signed)
Name: Joyce Blackburn   MRN: [id number]    DOB: 08/09/1960   Date:03/16/2021       Progress Note  Subjective  Chief Complaint  Follow Up  HPI  DMII: She states glucose fasting has been around 120, but goes up to 300's after meals. Currently on Toujeo 36 units daily, Farxiga 10 mg, Metformin 1500 mg daily.  A1C still up at 10.4 % done at work on 03/08/2021  She has not been on GLP-1 agonist because Ozempic caused bloating and rotten taste in her mouth, however willing to resume it to get glucose at goal.  Taking ARB and statin therapy , she states neuropathy is better, symptoms no longer daily. She denies polyphagia, she has polydipsia and polyuria. She has dyslipidemia and obesity   GERD: she also has a history of gastric ulcer over 20  years ago. She states occasionally still has nausea with certain smells, like grease. She was seen by Dr. Lars Pinks and had multiple labs done for evaluation of fatty liver, HIDA was normal. She is on Pantoprazole ( Dexilant no longer covered)  and symptoms have been better controlled, we are resuming a GLP-1 agonist and will monitor for recurrence of symptoms    OSA: she has been wearing CPAP every night, morning headaches resolved since last visit    Dyslipidemia: she is current taking Rosuvastatin , LDL is down to 46, continue medication    HTN: bp is at goal,  but denies pain, palpitation, SOB  or dizziness . Compliant with medication    Vitamin D deficiency: level was low, taking supplementation    Menopause/major depression mild : she states Zoloft 100 mg  and states hot flashes is still present but night sweats has improved she is doing well, she was feeling more emotional and we added Abilify back in 02/2020. She is doing better, PHq 9 negative    Insomnia: she is taking Temazepam and it helps her fall and stay asleep, no side effects of medications . Unchanged    Obesity : weight is up a few pounds, reminded her of diabetic diet. Diabetes in uncontrolled    Migraine headaches:  Episodes are starting suddenly associated with nausea and vomiting. She denies any recent episodes, none in the past month. She states Bernita Raisin works but was making her sleepy. No episodes lately and not longer taking medications   B12 deficiency: no paresthesia, taking sublingual B12   Sciatica : took prednisone in July , doing well   Patient Active Problem List   Diagnosis Date Noted   Major depression in remission (HCC) 12/19/2017   Fatty liver 03/04/2016   Vitamin D deficiency 03/04/2016   Gastroesophageal reflux disease without esophagitis 07/04/2015   Diabetic polyneuropathy associated with type 2 diabetes mellitus (HCC) 03/03/2015   Peripheral neuropathy 03/03/2015   Allergic to bees 02/20/2015   Benign hypertension 02/20/2015   Insomnia, persistent 02/20/2015   Chronic idiopathic constipation 02/20/2015   Dyslipidemia 02/20/2015   Dermatitis, eczematoid 02/20/2015   H/O gastric ulcer 02/20/2015   Herpes 02/20/2015   Migraine without aura and responsive to treatment 02/20/2015   Morbid obesity (HCC) 02/20/2015   Obstructive apnea 02/20/2015   Paresthesia of arm 02/20/2015   Periodic limb movement 02/20/2015   Allergic rhinitis, seasonal 02/20/2015   Menopausal symptom 02/20/2015    Past Surgical History:  Procedure Laterality Date   ABDOMINAL HYSTERECTOMY     COLONOSCOPY WITH PROPOFOL N/A 05/26/2020   Procedure: COLONOSCOPY WITH PROPOFOL;  Surgeon: Wyline Mood, MD;  Location: [facility];  Service: Gastroenterology;  Laterality: N/A;   RETINAL TEAR REPAIR CRYOTHERAPY Right    [facility] (Dr. Robin Searing)    Family History  Problem Relation Age of Onset   Mental illness Mother    Diabetes Mother    CVA Mother    Schizophrenia Mother    Diabetes Sister    Hypertension Sister    Diabetes Brother    Mental illness Brother    Bipolar disorder Brother    Schizophrenia Brother     Social History   Tobacco Use   Smoking status: Former     Packs/day: 0.10    Years: 15.00    Pack years: 1.50    Types: Cigarettes    Quit date: 07/29/2004    Years since quitting: 16.6   Smokeless tobacco: Never  Substance Use Topics   Alcohol use: No    Alcohol/week: 0.0 standard drinks     Current Outpatient Medications:    ARIPiprazole (ABILIFY) 2 MG tablet, Take 1 tablet (2 mg total) by mouth daily., Disp: 90 tablet, Rfl: 1   aspirin 81 MG tablet, Take 81 mg by mouth daily., Disp: , Rfl:    EPINEPHRINE, ANAPHYLAXIS THERAPY AGENTS,, EPIPEN 2-PAK, 0.3MG /0.3ML (Injection Device)  1 Device use as directed for 30 days  Quantity: 2;  Refills: 1   Ordered :06-Mar-2012  Alba Cory MD;  Started 06-Mar-2012 Active Comments: DX: 989.5, Disp: , Rfl:    FARXIGA 10 MG TABS tablet, TAKE 1 TABLET BY MOUTH  DAILY BEFORE BREAKFAST, Disp: 90 tablet, Rfl: 1   insulin glargine, 1 Unit Dial, (TOUJEO SOLOSTAR) 300 UNIT/ML Solostar Pen, Inject 30 Units into the skin daily., Disp: 15 mL, Rfl: 1   Insulin Pen Needle (NOVOFINE PEN NEEDLE) 32G X 6 MM MISC, See admin instructions., Disp: , Rfl:    loratadine (CLARITIN) 10 MG tablet, Take 1 tablet (10 mg total) by mouth daily., Disp: 30 tablet, Rfl: 11   metFORMIN (GLUCOPHAGE-XR) 750 MG 24 hr tablet, Take 2 tablets (1,500 mg total) by mouth daily with breakfast., Disp: 180 tablet, Rfl: 1   pantoprazole (PROTONIX) 40 MG tablet, Take 1 tablet (40 mg total) by mouth daily., Disp: 90 tablet, Rfl: 3   rosuvastatin (CRESTOR) 40 MG tablet, Take 1 tablet (40 mg total) by mouth daily. In place of Atorvastatin, Disp: 90 tablet, Rfl: 3   Semaglutide (RYBELSUS) 7 MG TABS, Take 7 mg by mouth daily., Disp: 90 tablet, Rfl: 0   triamcinolone ointment (KENALOG) 0.5 %, Apply 1 application topically 2 (two) times daily., Disp: 60 g, Rfl: 0   valsartan (DIOVAN) 80 MG tablet, Take 1 tablet (80 mg total) by mouth daily., Disp: 90 tablet, Rfl: 1   Vitamin D, Ergocalciferol, (DRISDOL) 1.25 MG (50000 UNIT) CAPS capsule, Take 1 capsule  (50,000 Units total) by mouth every 7 (seven) days., Disp: 12 capsule, Rfl: 3   fluconazole (DIFLUCAN) 150 MG tablet, Take 1 tablet (150 mg total) by mouth every other day. PRB, Disp: 12 tablet, Rfl: 0   montelukast (SINGULAIR) 10 MG tablet, Take 1 tablet (10 mg total) by mouth at bedtime., Disp: 90 tablet, Rfl: 1   sertraline (ZOLOFT) 100 MG tablet, Take 1 tablet (100 mg total) by mouth daily., Disp: 90 tablet, Rfl: 1   temazepam (RESTORIL) 30 MG capsule, Take 1 capsule (30 mg total) by mouth daily., Disp: 90 capsule, Rfl: 0   valACYclovir (VALTREX) 1000 MG tablet, Take 1 tablet (1,000 mg total) by mouth 2 (two) times  daily. Prn (Patient not taking: No sig reported), Disp: 60 tablet, Rfl: 0  Allergies  Allergen Reactions   Ace Inhibitors     cough   Bee Venom     Bee   Dapagliflozin     hematuria   Ozempic (0.25 Or 0.5 Mg-Dose) [Semaglutide(0.25 Or 0.5mg -Dos)]     Bloating     I personally reviewed active problem list, medication list, allergies, family history, social history, health maintenance with the patient/caregiver today.   ROS  Constitutional: Negative for fever or weight change.  Respiratory: Negative for cough and shortness of breath.   Cardiovascular: Negative for chest pain or palpitations.  Gastrointestinal: Negative for abdominal pain, no bowel changes.  Musculoskeletal: Negative for gait problem or joint swelling.  Skin: Negative for rash.  Neurological: Negative for dizziness or headache.  No other specific complaints in a complete review of systems (except as listed in HPI above).   Objective  Vitals:   03/16/21 0838  BP: 138/82  Pulse: 96  Resp: 14  Temp: 98.2 F (36.8 C)  TempSrc: Oral  SpO2: 98%  Weight: 204 lb 1.6 oz (92.6 kg)  Height: 5\' 7"  (1.702 m)    Body mass index is 31.97 kg/m.  Physical Exam  Constitutional: Patient appears well-developed and well-nourished. Obese  No distress.  HEENT: head atraumatic, normocephalic, pupils equal  and reactive to light, neck supple Cardiovascular: Normal rate, regular rhythm and normal heart sounds.  No murmur heard. No BLE edema. Pulmonary/Chest: Effort normal and breath sounds normal. No respiratory distress. Abdominal: Soft.  There is no tenderness. GYN: dry perineum, vulva was inflamed and swollen, vaginal walls normal, very scant discharge, no cervix Psychiatric: Patient has a normal mood and affect. behavior is normal. Judgment and thought content normal.   Recent Results (from the past 2160 hour(s))  Basic metabolic panel     Status: None   Collection Time: 03/08/21 12:00 AM  Result Value Ref Range   Glucose 326   Lipid panel     Status: None   Collection Time: 03/08/21 12:00 AM  Result Value Ref Range   LDl/HDL Ratio 2.5    Triglycerides 133 40 - 160   Cholesterol 115 0 - 200   HDL 46 35 - 70   LDL Cholesterol 46   Hemoglobin A1c     Status: None   Collection Time: 03/08/21 12:00 AM  Result Value Ref Range   Hemoglobin A1C 10.4     Diabetic Foot Exam: Diabetic Foot Exam - Simple   Simple Foot Form Visual Inspection See comments: Yes Sensation Testing Intact to touch and monofilament testing bilaterally: Yes Pulse Check Posterior Tibialis and Dorsalis pulse intact bilaterally: Yes Comments Dry feet with some areas of scaly skin on sides       PHQ2/9: Depression screen [facility] 03/16/2021 01/26/2021 12/11/2020 09/12/2020 06/06/2020  Decreased Interest 0 0 1 1 1   Down, Depressed, Hopeless 0 0 0 1 1  PHQ - 2 Score 0 0 1 2 2   Altered sleeping 0 - 0 0 3  Tired, decreased energy 0 - 0 0 2  Change in appetite 0 - 0 1 1  Feeling bad or failure about yourself  0 - 0 1 0  Trouble concentrating 0 - 0 0 0  Moving slowly or fidgety/restless 0 - 0 0 0  Suicidal thoughts 0 - 0 0 0  PHQ-9 Score 0 - 1 4 8   Difficult doing work/chores Not difficult at all - - -  Not difficult at all  Some recent data might be hidden    phq 9 is negative   Fall Risk: Fall Risk   03/16/2021 01/26/2021 12/11/2020 09/12/2020 06/06/2020  Falls in the past year? 0 0 0 0 0  Number falls in past yr: 0 0 0 0 0  Injury with Fall? 0 0 0 0 0  Follow up Falls evaluation completed - - - -     Functional Status Survey: Is the patient deaf or have difficulty hearing?: No Does the patient have difficulty seeing, even when wearing glasses/contacts?: Yes Does the patient have difficulty concentrating, remembering, or making decisions?: No Does the patient have difficulty walking or climbing stairs?: No Does the patient have difficulty dressing or bathing?: No Does the patient have difficulty doing errands alone such as visiting a doctor's office or shopping?: No    Assessment & Plan  1. Dyslipidemia associated with type 2 diabetes mellitus (HCC)  - HM Diabetes Foot Exam - Semaglutide (RYBELSUS) 7 MG TABS; Take 7 mg by mouth daily.  Dispense: 90 tablet; Refill: 0  2. OSA on CPAP   3. Diabetic polyneuropathy associated with type 2 diabetes mellitus (HCC)  - Semaglutide (RYBELSUS) 7 MG TABS; Take 7 mg by mouth daily.  Dispense: 90 tablet; Refill: 0  4. Vitamin D deficiency   5. Benign hypertension   6. Migraine without aura and without status migrainosus, not intractable   7. Mild recurrent major depression (HCC)   8. Morbid obesity (HCC)  Discussed with the patient the risk posed by an increased BMI. Discussed importance of portion control, calorie counting and at least 150 minutes of physical activity weekly. Avoid sweet beverages and drink more water. Eat at least 6 servings of fruit and vegetables daily    9. Seasonal allergic rhinitis, unspecified trigger  - montelukast (SINGULAIR) 10 MG tablet; Take 1 tablet (10 mg total) by mouth at bedtime.  Dispense: 90 tablet; Refill: 1  10. Menopausal symptom  - sertraline (ZOLOFT) 100 MG tablet; Take 1 tablet (100 mg total) by mouth daily.  Dispense: 90 tablet; Refill: 1  11. Insomnia, persistent  - temazepam  (RESTORIL) 30 MG capsule; Take 1 capsule (30 mg total) by mouth daily.  Dispense: 90 capsule; Refill: 0

## 2021-03-16 ENCOUNTER — Encounter: Payer: Self-pay | Admitting: Family Medicine

## 2021-03-16 ENCOUNTER — Ambulatory Visit: Payer: Managed Care, Other (non HMO) | Admitting: Family Medicine

## 2021-03-16 ENCOUNTER — Other Ambulatory Visit: Payer: Self-pay

## 2021-03-16 VITALS — BP 138/82 | HR 96 | Temp 98.2°F | Resp 14 | Ht 67.0 in | Wt 204.1 lb

## 2021-03-16 DIAGNOSIS — F330 Major depressive disorder, recurrent, mild: Secondary | ICD-10-CM

## 2021-03-16 DIAGNOSIS — I10 Essential (primary) hypertension: Secondary | ICD-10-CM

## 2021-03-16 DIAGNOSIS — E559 Vitamin D deficiency, unspecified: Secondary | ICD-10-CM

## 2021-03-16 DIAGNOSIS — J302 Other seasonal allergic rhinitis: Secondary | ICD-10-CM

## 2021-03-16 DIAGNOSIS — G43009 Migraine without aura, not intractable, without status migrainosus: Secondary | ICD-10-CM

## 2021-03-16 DIAGNOSIS — N760 Acute vaginitis: Secondary | ICD-10-CM

## 2021-03-16 DIAGNOSIS — G4700 Insomnia, unspecified: Secondary | ICD-10-CM

## 2021-03-16 DIAGNOSIS — E1142 Type 2 diabetes mellitus with diabetic polyneuropathy: Secondary | ICD-10-CM

## 2021-03-16 DIAGNOSIS — G4733 Obstructive sleep apnea (adult) (pediatric): Secondary | ICD-10-CM | POA: Diagnosis not present

## 2021-03-16 DIAGNOSIS — E785 Hyperlipidemia, unspecified: Secondary | ICD-10-CM

## 2021-03-16 DIAGNOSIS — Z9989 Dependence on other enabling machines and devices: Secondary | ICD-10-CM

## 2021-03-16 DIAGNOSIS — E1169 Type 2 diabetes mellitus with other specified complication: Secondary | ICD-10-CM

## 2021-03-16 DIAGNOSIS — N951 Menopausal and female climacteric states: Secondary | ICD-10-CM

## 2021-03-16 MED ORDER — FLUCONAZOLE 150 MG PO TABS: 150.0000 mg | ORAL_TABLET | ORAL | 0 refills | Status: DC

## 2021-03-16 MED ORDER — MONTELUKAST SODIUM 10 MG PO TABS: 10.0000 mg | ORAL_TABLET | Freq: Every day | ORAL | 1 refills | Status: DC

## 2021-03-16 MED ORDER — SERTRALINE HCL 100 MG PO TABS: 100.0000 mg | ORAL_TABLET | Freq: Every day | ORAL | 1 refills | Status: DC

## 2021-03-16 MED ORDER — RYBELSUS 7 MG PO TABS: 7.0000 mg | ORAL_TABLET | Freq: Every day | ORAL | 0 refills | Status: DC

## 2021-03-16 MED ORDER — TEMAZEPAM 30 MG PO CAPS: 30.0000 mg | ORAL_CAPSULE | Freq: Every day | ORAL | 0 refills | Status: DC

## 2021-03-16 NOTE — Addendum Note (Signed)
Addended by: Evarose Altland G on: 03/16/2021 12:10 PM   Modules accepted: Orders

## 2021-03-16 NOTE — Patient Instructions (Addendum)
Apply  OTC Hydrocortisone cream Lotion clotrimazole cream

## 2021-03-20 LAB — NUSWAB VAGINITIS (VG)
Candida albicans, NAA: NEGATIVE
Candida glabrata, NAA: NEGATIVE
Trich vag by NAA: NEGATIVE

## 2021-03-22 ENCOUNTER — Other Ambulatory Visit: Payer: Self-pay | Admitting: Family Medicine

## 2021-03-22 DIAGNOSIS — Z1231 Encounter for screening mammogram for malignant neoplasm of breast: Secondary | ICD-10-CM

## 2021-03-26 ENCOUNTER — Other Ambulatory Visit: Payer: Self-pay

## 2021-03-29 ENCOUNTER — Encounter: Payer: Self-pay | Admitting: Family Medicine

## 2021-03-30 ENCOUNTER — Other Ambulatory Visit: Payer: Self-pay | Admitting: Family Medicine

## 2021-03-30 MED ORDER — PREGABALIN 50 MG PO CAPS: 50.0000 mg | ORAL_CAPSULE | Freq: Three times a day (TID) | ORAL | 0 refills | Status: DC

## 2021-05-04 ENCOUNTER — Other Ambulatory Visit: Payer: Self-pay | Admitting: Family Medicine

## 2021-05-04 DIAGNOSIS — E559 Vitamin D deficiency, unspecified: Secondary | ICD-10-CM

## 2021-05-04 DIAGNOSIS — F330 Major depressive disorder, recurrent, mild: Secondary | ICD-10-CM

## 2021-05-04 DIAGNOSIS — E1142 Type 2 diabetes mellitus with diabetic polyneuropathy: Secondary | ICD-10-CM

## 2021-05-04 DIAGNOSIS — I152 Hypertension secondary to endocrine disorders: Secondary | ICD-10-CM

## 2021-05-04 DIAGNOSIS — K219 Gastro-esophageal reflux disease without esophagitis: Secondary | ICD-10-CM

## 2021-05-04 DIAGNOSIS — I10 Essential (primary) hypertension: Secondary | ICD-10-CM

## 2021-05-10 ENCOUNTER — Telehealth: Payer: Self-pay | Admitting: Family Medicine

## 2021-05-10 NOTE — Telephone Encounter (Signed)
Done

## 2021-05-10 NOTE — Telephone Encounter (Signed)
Alejah calling from [facility] is calling to receive clairity on Vitamin D is it Vit D 2 or Vit D3 [phone] Reference Number [id number]

## 2021-05-17 ENCOUNTER — Other Ambulatory Visit: Payer: Self-pay | Admitting: Family Medicine

## 2021-05-17 DIAGNOSIS — E1142 Type 2 diabetes mellitus with diabetic polyneuropathy: Secondary | ICD-10-CM

## 2021-05-17 DIAGNOSIS — E1169 Type 2 diabetes mellitus with other specified complication: Secondary | ICD-10-CM

## 2021-05-17 NOTE — Telephone Encounter (Signed)
Requested medications are due for refill today.  yes  Requested medications are on the active medications list.  yes  Last refill. 03/16/2021  Future visit scheduled.   yes  Notes to clinic.  Medication not assigned a protocol.

## 2021-06-02 ENCOUNTER — Other Ambulatory Visit: Payer: Self-pay | Admitting: Family Medicine

## 2021-06-02 DIAGNOSIS — G4700 Insomnia, unspecified: Secondary | ICD-10-CM

## 2021-06-02 NOTE — Telephone Encounter (Signed)
Requested medication (s) are due for refill today: yes  Requested medication (s) are on the active medication list: yes  Last refill:  03/16/21 #90  Future visit scheduled: yes  Notes to clinic:  med not delegated to NT to RF   Requested Prescriptions  Pending Prescriptions Disp Refills   temazepam (RESTORIL) 30 MG capsule [Pharmacy Med Name: TEMAZEPAM  30MG   CAP] 90 capsule     Sig: TAKE 1 CAPSULE BY MOUTH  DAILY     Not Delegated - Psychiatry:  Anxiolytics/Hypnotics Failed - 06/02/2021 10:59 AM      Failed - This refill cannot be delegated      Failed - Urine Drug Screen completed in last 360 days      Passed - Valid encounter within last 6 months    Recent Outpatient Visits           2 months ago Dyslipidemia associated with type 2 diabetes mellitus [facility])   [facility], MD   4 months ago Sciatic nerve pain, right   [facility] [place], [place], MD   5 months ago Diabetic polyneuropathy associated with type 2 diabetes mellitus [facility])   [facility] [place], [place], MD   8 months ago Diabetic polyneuropathy associated with type 2 diabetes mellitus [facility])   [facility] [place], [place]ug[place], MD   12 months ago Diabetic polyneuropathy associated with type 2 diabetes mellitus [facility])   [facility], MD       Future Appointments             In 1 week Alba Cory, MD [facility]

## 2021-06-14 NOTE — Progress Notes (Signed)
Name: Joyce Blackburn   MRN: [id number]    DOB: 01-03-1961   Date:06/15/2021       Progress Note  Subjective  Chief Complaint  Follow Up  HPI  DMII: She states glucose fasting has been around 150 but goes up to 175 's after meals.  A1C still up at 10.4 % done at work on 03/08/2021  She is now on Rubelsus and Metformin and Toujeo 36 units and A1C is down from 10.4 % to 8.4 % , off SGL-2 agonist due to recurrent yeast infection  Taking ARB and statin therapy , she states neuropathy is better, symptoms no longer daily. She denies polyphagia, she has polydipsia and polyuria. She has dyslipidemia and obesity   GERD: she also has a history of gastric ulcer over 20  years ago. She states occasionally still has nausea with certain smells, like grease. She was seen by Dr. Durwin Reges and had multiple labs done for evaluation of fatty liver, HIDA was normal. She is on Pantoprazole ( Dexilant no longer covered)  and symptoms have been better controlled, she is on Rybelsus and vomited yesterday but usually not affecting her stomach   OSA: she has been wearing CPAP every night, morning headaches resolved since last visit . Unchanged    Dyslipidemia: she is current taking Rosuvastatin , LDL is down to 46, continue medication , no side effects    HTN: bp is elevated today but denies pain, palpitation, SOB  or dizziness . Compliant with medication but states she was vomiting yesterday and not feeling well, but usually bp is at goal    Vitamin D deficiency: level was low, taking supplementation . Unchanged    Menopause/major depression mild : she states Zoloft 100 mg  and states hot flashes is still present but night sweats has improved she is doing well, she was feeling more emotional and we added Abilify back in 02/2020. She is doing better, PHq 9 negative    Insomnia: she is taking Temazepam and it helps her fall and stay asleep, no side effects of medications .Needs a refill today    Obesity : gaining weight  but likely because diabetes is getting under control, try to eat healthy meals and monitor   Migraine headaches:  Episodes are starting suddenly associated with nausea and vomiting. She denies any recent episodes, none in the past month. She states Roselyn Meier works but was making her sleepy. No episodes lately and not longer taking medications   B12 deficiency: no paresthesia, taking sublingual B12 . Unchanged   Sciatica :seeing chiropractor, long history of recurrent lower back pain since 2019 , but since 2022  has intermittent radiculitis . She  took prednisone in July , we gave her Lyrica but only taking prn, she states sometimes cannot walk do to intense pain radiating down right leg. Pain is intermittent and getting more frequent. She states Tylenol # 3 is helpful and would like a refill.   Patient Active Problem List   Diagnosis Date Noted   Major depression in remission ([place]) 12/19/2017   Fatty liver 03/04/2016   Vitamin D deficiency 03/04/2016   Gastroesophageal reflux disease without esophagitis 07/04/2015   Diabetic polyneuropathy associated with type 2 diabetes mellitus ([place]) 03/03/2015   Peripheral neuropathy 03/03/2015   Allergic to bees 02/20/2015   Benign hypertension 02/20/2015   Insomnia, persistent 02/20/2015   Chronic idiopathic constipation 02/20/2015   Dyslipidemia 02/20/2015   Dermatitis, eczematoid 02/20/2015   H/O gastric ulcer 02/20/2015   Herpes  02/20/2015   Migraine without aura and responsive to treatment 02/20/2015   Morbid obesity ([place]) 02/20/2015   Obstructive apnea 02/20/2015   Paresthesia of arm 02/20/2015   Periodic limb movement 02/20/2015   Allergic rhinitis, seasonal 02/20/2015   Menopausal symptom 02/20/2015    Past Surgical History:  Procedure Laterality Date   ABDOMINAL HYSTERECTOMY     COLONOSCOPY WITH PROPOFOL N/A 05/26/2020   Procedure: COLONOSCOPY WITH PROPOFOL;  Surgeon: Jonathon Bellows, MD;  Location: [facility] ENDOSCOPY;  Service:  Gastroenterology;  Laterality: N/A;   RETINAL TEAR REPAIR CRYOTHERAPY Right    [place] (Dr. Noel Journey)    Family History  Problem Relation Age of Onset   Mental illness Mother    Diabetes Mother    CVA Mother    Schizophrenia Mother    Diabetes Sister    Hypertension Sister    Diabetes Brother    Mental illness Brother    Bipolar disorder Brother    Schizophrenia Brother     Social History   Tobacco Use   Smoking status: Former    Packs/day: 0.10    Years: 15.00    Pack years: 1.50    Types: Cigarettes    Quit date: 07/29/2004    Years since quitting: 16.8   Smokeless tobacco: Never  Substance Use Topics   Alcohol use: No    Alcohol/week: 0.0 standard drinks     Current Outpatient Medications:    ARIPiprazole (ABILIFY) 2 MG tablet, TAKE 1 TABLET BY MOUTH  DAILY, Disp: 90 tablet, Rfl: 3   aspirin 81 MG tablet, Take 81 mg by mouth daily., Disp: , Rfl:    EPINEPHRINE, ANAPHYLAXIS THERAPY AGENTS,, EPIPEN 2-PAK, 0.3MG/0.3ML (Injection Device)  1 Device use as directed for 30 days  Quantity: 2;  Refills: 1   Ordered :06-Mar-2012  Steele Sizer MD;  Marisa Sprinkles Active Comments: DX: 989.5, Disp: , Rfl:    FARXIGA 10 MG TABS tablet, TAKE 1 TABLET BY MOUTH  DAILY BEFORE BREAKFAST, Disp: 90 tablet, Rfl: 1   fluconazole (DIFLUCAN) 150 MG tablet, Take 1 tablet (150 mg total) by mouth every other day. PRB, Disp: 12 tablet, Rfl: 0   insulin glargine, 1 Unit Dial, (TOUJEO SOLOSTAR) 300 UNIT/ML Solostar Pen, Inject 30 Units into the skin daily., Disp: 15 mL, Rfl: 1   Insulin Pen Needle (NOVOFINE PEN NEEDLE) 32G X 6 MM MISC, See admin instructions., Disp: , Rfl:    loratadine (CLARITIN) 10 MG tablet, Take 1 tablet (10 mg total) by mouth daily., Disp: 30 tablet, Rfl: 11   metFORMIN (GLUCOPHAGE-XR) 750 MG 24 hr tablet, TAKE 2 TABLETS BY MOUTH  DAILY WITH BREAKFAST, Disp: 180 tablet, Rfl: 3   montelukast (SINGULAIR) 10 MG tablet, Take 1 tablet (10 mg total) by mouth at bedtime.,  Disp: 90 tablet, Rfl: 1   pantoprazole (PROTONIX) 40 MG tablet, TAKE 1 TABLET BY MOUTH  DAILY, Disp: 90 tablet, Rfl: 3   pregabalin (LYRICA) 50 MG capsule, Take 1 capsule (50 mg total) by mouth 3 (three) times daily., Disp: 90 capsule, Rfl: 0   rosuvastatin (CRESTOR) 40 MG tablet, Take 1 tablet (40 mg total) by mouth daily. In place of Atorvastatin, Disp: 90 tablet, Rfl: 3   RYBELSUS 7 MG TABS, TAKE 1 TABLET BY MOUTH  DAILY, Disp: 90 tablet, Rfl: 0   sertraline (ZOLOFT) 100 MG tablet, Take 1 tablet (100 mg total) by mouth daily., Disp: 90 tablet, Rfl: 1   temazepam (RESTORIL) 30 MG capsule, Take 1 capsule (30  mg total) by mouth daily., Disp: 90 capsule, Rfl: 0   triamcinolone ointment (KENALOG) 0.5 %, Apply 1 application topically 2 (two) times daily., Disp: 60 g, Rfl: 0   valACYclovir (VALTREX) 1000 MG tablet, Take 1 tablet (1,000 mg total) by mouth 2 (two) times daily. Prn, Disp: 60 tablet, Rfl: 0   valsartan (DIOVAN) 80 MG tablet, TAKE 1 TABLET BY MOUTH  DAILY, Disp: 90 tablet, Rfl: 3   Vitamin D, Ergocalciferol, (DRISDOL) 1.25 MG (50000 UNIT) CAPS capsule, TAKE 1 CAPSULE BY MOUTH  EVERY 7 DAYS, Disp: 13 capsule, Rfl: 3  Allergies  Allergen Reactions   Ace Inhibitors     cough   Bee Venom     Bee   Dapagliflozin     hematuria   Ozempic (0.25 Or 0.5 Mg-Dose) [Semaglutide(0.25 Or 0.63m-Dos)]     Bloating     I personally reviewed active problem list, medication list, allergies, family history, social history, health maintenance with the patient/caregiver today.   ROS  Constitutional: Negative for fever, positive for weight change.  Respiratory: Negative for cough and shortness of breath.   Cardiovascular: Negative for chest pain or palpitations.  Gastrointestinal: Negative for abdominal pain, no bowel changes. She vomited yesterday but resolved now  Musculoskeletal: Negative for gait problem or joint swelling.  Skin: Negative for rash.  Neurological: Negative for dizziness or  headache.  No other specific complaints in a complete review of systems (except as listed in HPI above).   Objective  Vitals:   06/15/21 1041 06/15/21 1048  BP: (!) 150/78 (!) 144/76  Pulse: 99   Resp: 16   Temp: 98.3 F (36.8 C)   TempSrc: Oral   SpO2: 97%   Weight: 210 lb 9.6 oz (95.5 kg)   Height: _0  (1.702 m)     Body mass index is 32.98 kg/m.  Physical Exam  Constitutional: Patient appears well-developed and well-nourished. Obese  No distress.  HEENT: head atraumatic, normocephalic, pupils equal and reactive to light, neck supple Cardiovascular: Normal rate, regular rhythm and normal heart sounds.  No murmur heard. No BLE edema. Pulmonary/Chest: Effort normal and breath sounds normal. No respiratory distress. Abdominal: Soft.  There is no tenderness. Psychiatric: Patient has a normal mood and affect. behavior is normal. Judgment and thought content normal.    PHQ2/9: [facility] 06/15/2021 03/16/2021 01/26/2021 12/11/2020 09/12/2020  Decreased Interest 1 0 0 1 1  Down, Depressed, Hopeless 1 0 0 0 1  PHQ - 2 Score 2 0 0 1 2  Altered sleeping 0 0 - 0 0  Tired, decreased energy 0 0 - 0 0  Change in appetite 0 0 - 0 1  Feeling bad or failure about yourself  0 0 - 0 1  Trouble concentrating 0 0 - 0 0  Moving slowly or fidgety/restless 0 0 - 0 0  Suicidal thoughts 0 0 - 0 0  PHQ-9 Score 2 0 - 1 4  Difficult doing work/chores Not difficult at all Not difficult at all - - -  Some recent data might be hidden    phq 9 is positive   Fall Risk: Fall Risk  06/15/2021 03/16/2021 01/26/2021 12/11/2020 09/12/2020  Falls in the past year? 0 0 0 0 0  Number falls in past yr: 0 0 0 0 0  Injury with Fall? 0 0 0 0 0  Risk for fall due to : No Fall Risks - - - -  Follow up Falls prevention discussed Falls  evaluation completed - - -      Functional Status Survey: Is the patient deaf or have difficulty hearing?: No Does the patient have difficulty seeing, even when  wearing glasses/contacts?: Yes Does the patient have difficulty concentrating, remembering, or making decisions?: Yes Does the patient have difficulty walking or climbing stairs?: Yes Does the patient have difficulty dressing or bathing?: No Does the patient have difficulty doing errands alone such as visiting a doctor's office or shopping?: No    Assessment & Plan  1. Dyslipidemia associated with type 2 diabetes mellitus (HCC)  - POCT HgB A1C - Semaglutide (RYBELSUS) 7 MG TABS; Take 1 tablet by mouth daily.  Dispense: 90 tablet; Refill: 0 - Blood Glucose Monitoring Suppl (CONTOUR NEXT EZ) w/Device KIT; 1 each by Does not apply route daily at 12 noon.  Dispense: 1 kit; Refill: 0  2. Need for influenza vaccination  - Flu Vaccine QUAD 6+ mos PF IM (Fluarix Quad PF)  3. Seasonal allergic rhinitis, unspecified trigger   4. Diabetic polyneuropathy associated with type 2 diabetes mellitus (HCC)  - Semaglutide (RYBELSUS) 7 MG TABS; Take 1 tablet by mouth daily.  Dispense: 90 tablet; Refill: 0  5. Menopausal symptom   6. Insomnia, persistent  - temazepam (RESTORIL) 30 MG capsule; Take 1 capsule (30 mg total) by mouth daily.  Dispense: 90 capsule; Refill: 0  7. Lumbar back pain with radiculopathy affecting right lower extremity  - pregabalin (LYRICA) 75 MG capsule; Take 1 capsule (75 mg total) by mouth 2 (two) times daily.  Dispense: 180 capsule; Refill: 0 - MR Lumbar Spine Wo Contrast; Future - acetaminophen-codeine (TYLENOL #3) 300-30 MG tablet; Take 1 tablet by mouth every 4 (four) hours as needed for moderate pain.  Dispense: 30 tablet; Refill: 0

## 2021-06-15 ENCOUNTER — Encounter: Payer: Self-pay | Admitting: Family Medicine

## 2021-06-15 ENCOUNTER — Ambulatory Visit: Payer: Managed Care, Other (non HMO) | Admitting: Family Medicine

## 2021-06-15 ENCOUNTER — Ambulatory Visit
Admission: RE | Admit: 2021-06-15 | Discharge: 2021-06-15 | Disposition: A | Discharge status: Home / Self Care | Payer: Managed Care, Other (non HMO) | Source: Ambulatory Visit | Attending: Family Medicine | Admitting: Family Medicine

## 2021-06-15 ENCOUNTER — Other Ambulatory Visit: Payer: Self-pay

## 2021-06-15 VITALS — BP 144/76 | HR 99 | Temp 98.3°F | Resp 16 | Ht 67.0 in | Wt 210.6 lb

## 2021-06-15 DIAGNOSIS — E1142 Type 2 diabetes mellitus with diabetic polyneuropathy: Secondary | ICD-10-CM | POA: Diagnosis not present

## 2021-06-15 DIAGNOSIS — E785 Hyperlipidemia, unspecified: Secondary | ICD-10-CM | POA: Diagnosis not present

## 2021-06-15 DIAGNOSIS — J302 Other seasonal allergic rhinitis: Secondary | ICD-10-CM | POA: Diagnosis not present

## 2021-06-15 DIAGNOSIS — Z1231 Encounter for screening mammogram for malignant neoplasm of breast: Secondary | ICD-10-CM | POA: Insufficient documentation

## 2021-06-15 DIAGNOSIS — N951 Menopausal and female climacteric states: Secondary | ICD-10-CM

## 2021-06-15 DIAGNOSIS — E1169 Type 2 diabetes mellitus with other specified complication: Secondary | ICD-10-CM | POA: Diagnosis not present

## 2021-06-15 DIAGNOSIS — G4700 Insomnia, unspecified: Secondary | ICD-10-CM

## 2021-06-15 DIAGNOSIS — Z23 Encounter for immunization: Secondary | ICD-10-CM | POA: Diagnosis not present

## 2021-06-15 DIAGNOSIS — M5416 Radiculopathy, lumbar region: Secondary | ICD-10-CM

## 2021-06-15 LAB — POCT GLYCOSYLATED HEMOGLOBIN (HGB A1C): Hemoglobin A1C: 8.4 % — AB (ref 4.0–5.6)

## 2021-06-15 MED ORDER — RYBELSUS 7 MG PO TABS: 1.0000 | ORAL_TABLET | Freq: Every day | ORAL | 0 refills | Status: DC

## 2021-06-15 MED ORDER — CONTOUR NEXT EZ W/DEVICE KIT: 1.0000 | PACK | Freq: Every day | 0 refills | Status: DC

## 2021-06-15 MED ORDER — TEMAZEPAM 30 MG PO CAPS: 30.0000 mg | ORAL_CAPSULE | Freq: Every day | ORAL | 0 refills | Status: DC

## 2021-06-15 MED ORDER — ACETAMINOPHEN-CODEINE #3 300-30 MG PO TABS: 1.0000 | ORAL_TABLET | ORAL | 0 refills | Status: DC | PRN

## 2021-06-15 MED ORDER — PREGABALIN 75 MG PO CAPS: 75.0000 mg | ORAL_CAPSULE | Freq: Two times a day (BID) | ORAL | 0 refills | Status: DC

## 2021-06-18 ENCOUNTER — Telehealth: Payer: Self-pay | Admitting: Family Medicine

## 2021-06-18 NOTE — Telephone Encounter (Signed)
Marie from outreach at [place] called for patient states that's whants referral for mammogram sent to [facility] instead of [facility]. Plesae call back patient. Fax # for Smitty Cords is [phone]

## 2021-06-20 ENCOUNTER — Other Ambulatory Visit: Payer: Self-pay

## 2021-06-20 ENCOUNTER — Other Ambulatory Visit: Payer: Self-pay | Admitting: Family Medicine

## 2021-06-20 DIAGNOSIS — E1169 Type 2 diabetes mellitus with other specified complication: Secondary | ICD-10-CM

## 2021-06-20 DIAGNOSIS — E785 Hyperlipidemia, unspecified: Secondary | ICD-10-CM

## 2021-06-20 MED ORDER — CONTOUR NEXT EZ W/DEVICE KIT: 1.0000 | PACK | Freq: Every day | 0 refills | Status: DC

## 2021-06-25 ENCOUNTER — Other Ambulatory Visit: Payer: Self-pay

## 2021-06-25 DIAGNOSIS — E1169 Type 2 diabetes mellitus with other specified complication: Secondary | ICD-10-CM

## 2021-06-25 MED ORDER — MICROLET LANCETS MISC: 1.0000 | Freq: Three times a day (TID) | 3 refills | Status: DC

## 2021-06-25 MED ORDER — CONTOUR NEXT EZ W/DEVICE KIT: 1.0000 | PACK | Freq: Every day | 0 refills | Status: DC

## 2021-06-25 MED ORDER — CONTOUR NEXT TEST VI STRP: ORAL_STRIP | 12 refills | Status: DC

## 2021-06-26 NOTE — Telephone Encounter (Signed)
[employer] outreach called back to check on the status of her previous request and also stated the order for  MR LUMBAR SPINE WO CONTRAST needs to be faxed over to [facility] at [phone] or [phone].

## 2021-06-26 NOTE — Telephone Encounter (Signed)
Order faxed.

## 2021-06-26 NOTE — Telephone Encounter (Signed)
Mammogram was completed at [facility] on 06/15/2021 at 1pm. I will reach out to [facility] for MRI scheduling order information and fax it to them.

## 2021-08-13 ENCOUNTER — Encounter: Payer: Self-pay | Admitting: Family Medicine

## 2021-08-16 ENCOUNTER — Other Ambulatory Visit: Payer: Self-pay | Admitting: Family Medicine

## 2021-08-16 DIAGNOSIS — E785 Hyperlipidemia, unspecified: Secondary | ICD-10-CM

## 2021-08-16 DIAGNOSIS — E1169 Type 2 diabetes mellitus with other specified complication: Secondary | ICD-10-CM

## 2021-08-16 DIAGNOSIS — E1142 Type 2 diabetes mellitus with diabetic polyneuropathy: Secondary | ICD-10-CM

## 2021-08-16 NOTE — Telephone Encounter (Signed)
Requested medications are due for refill today.  yes  Requested medications are on the active medications list.  yes  Last refill. 06/15/2021  Future visit scheduled.   yes  Notes to clinic.  Medication assigned to a protocol.     Requested Prescriptions  Pending Prescriptions Disp Refills   RYBELSUS 7 MG TABS [Pharmacy Med Name: Rybelsus 7 MG Oral Tablet] 90 tablet 3    Sig: Take 1 tablet by mouth daily.     Off-Protocol Failed - 08/16/2021  4:47 AM      Failed - Medication not assigned to a protocol, review manually.      Passed - Valid encounter within last 12 months    Recent Outpatient Visits           2 months ago Dyslipidemia associated with type 2 diabetes mellitus [facility])   [facility] [place], Drue Stager, MD   5 months ago Dyslipidemia associated with type 2 diabetes mellitus [facility])   [facility] Steele Sizer, MD   6 months ago Sciatic nerve pain, right   [facility] [place], Drue Stager, MD   8 months ago Diabetic polyneuropathy associated with type 2 diabetes mellitus [facility])   [facility] Steele Sizer, MD   11 months ago Diabetic polyneuropathy associated with type 2 diabetes mellitus [facility])   [facility] Steele Sizer, MD       Future Appointments             In 3 weeks Steele Sizer, MD [facility]

## 2021-09-01 ENCOUNTER — Other Ambulatory Visit: Payer: Self-pay | Admitting: Family Medicine

## 2021-09-01 DIAGNOSIS — G4700 Insomnia, unspecified: Secondary | ICD-10-CM

## 2021-09-01 DIAGNOSIS — M5416 Radiculopathy, lumbar region: Secondary | ICD-10-CM

## 2021-09-07 NOTE — Progress Notes (Signed)
Name: Joyce Blackburn   MRN: [id number]    DOB: 11-03-1960   Date:09/10/2021       Progress Note  Subjective  Chief Complaint  Follow Up  HPI  DMII: She states glucose fasting has been around 150 but goes up to 175 's after meals.  A1C still up at 10.4 % done at work on 03/08/2021  She is on  Metformin and Toujeo 50 units and A1C went down from 10.4 % to 8.4 % , off SGL-2 agonist due to recurrent yeast infection  Taking ARB and statin therapy , she states neuropathy is better, symptoms no longer daily. She denies polyphagia, she has polydipsia and polyuria. She has dyslipidemia and obesity She continues to gain weight. She is not sure if she is taking Rybelsus. She will let me know today, if she is compliant with medication we will increase dose to 14 mg otherwise we will switch from toujeo to Xultophy 50 units daily. She just had intolerance to Ozempic. She will return in one month for follow up  GERD: she also has a history of gastric ulcer over 20  years ago. She states occasionally still has nausea with certain smells, like grease. She was seen by Dr. Durwin Reges and had multiple labs done for evaluation of fatty liver, HIDA was normal. She is on Pantoprazole ( Dexilant no longer covered)  and symptoms have been better controlled. Unchanged    OSA: she has been wearing CPAP every night, morning headaches resolved since last visit . Continue compliance    Dyslipidemia: she is current taking Rosuvastatin , LDL is down to 46,  no side effects . Continue medications    HTN: bp is at goal, she denies pain, palpitation, SOB  or dizziness .    Vitamin D deficiency: continue supplementation   Menopause/major depression mild : she states Zoloft 100 mg  and states hot flashes is still present but night sweats has improved she is doing well, she was feeling more emotional and we added Abilify back in 02/2020. She is doing better, PHq 9 negative    Insomnia: she is taking Temazepam and it helps her fall and  stay asleep, no side effects of medications  - denies daytime somnolence.    Obesity : gaining weight but likely because diabetes is getting under control, try to eat healthy meals and monitor   Migraine headaches:  Episodes are starting suddenly associated with nausea and vomiting. She denies any recent episodes, none in the past month. She states Roselyn Meier works but was making her sleepy. No recent episodes   B12 deficiency: no paresthesia, taking sublingual B12 . Unchanged   Sciatica :long history of recurrent lower back pain since 2019 , but since 2022  has intermittent radiculitis . She  took prednisone in July 2022, we gave her Lyrica and tylenol #3. She states Lyrica is helping seems to be helping, right now is zero , but can up to 4/10 and shoots down right lower leg. We will increase lyrica to take up to two at night.   Patient Active Problem List   Diagnosis Date Noted   Major depression in remission ([place]) 12/19/2017   Fatty liver 03/04/2016   Vitamin D deficiency 03/04/2016   Gastroesophageal reflux disease without esophagitis 07/04/2015   Diabetic polyneuropathy associated with type 2 diabetes mellitus ([place]) 03/03/2015   Peripheral neuropathy 03/03/2015   Allergic to bees 02/20/2015   Benign hypertension 02/20/2015   Insomnia, persistent 02/20/2015   Chronic idiopathic constipation  02/20/2015   Dyslipidemia 02/20/2015   Dermatitis, eczematoid 02/20/2015   H/O gastric ulcer 02/20/2015   Herpes 02/20/2015   Migraine without aura and responsive to treatment 02/20/2015   Morbid obesity ([place]) 02/20/2015   Obstructive apnea 02/20/2015   Paresthesia of arm 02/20/2015   Periodic limb movement 02/20/2015   Allergic rhinitis, seasonal 02/20/2015   Menopausal symptom 02/20/2015    Past Surgical History:  Procedure Laterality Date   ABDOMINAL HYSTERECTOMY     COLONOSCOPY WITH PROPOFOL N/A 05/26/2020   Procedure: COLONOSCOPY WITH PROPOFOL;  Surgeon: Jonathon Bellows, MD;  Location:  [facility] ENDOSCOPY;  Service: Gastroenterology;  Laterality: N/A;   RETINAL TEAR REPAIR CRYOTHERAPY Right    [place] (Dr. Noel Journey)    Family History  Problem Relation Age of Onset   Mental illness Mother    Diabetes Mother    CVA Mother    Schizophrenia Mother    Diabetes Sister    Hypertension Sister    Diabetes Brother    Mental illness Brother    Bipolar disorder Brother    Schizophrenia Brother     Social History   Tobacco Use   Smoking status: Former    Packs/day: 0.10    Years: 15.00    Pack years: 1.50    Types: Cigarettes    Quit date: 07/29/2004    Years since quitting: 17.1   Smokeless tobacco: Never  Substance Use Topics   Alcohol use: No    Alcohol/week: 0.0 standard drinks     Current Outpatient Medications:    acetaminophen-codeine (TYLENOL #3) 300-30 MG tablet, Take 1 tablet by mouth every 4 (four) hours as needed for moderate pain., Disp: 30 tablet, Rfl: 0   ARIPiprazole (ABILIFY) 2 MG tablet, TAKE 1 TABLET BY MOUTH  DAILY, Disp: 90 tablet, Rfl: 3   aspirin 81 MG tablet, Take 81 mg by mouth daily., Disp: , Rfl:    Blood Glucose Monitoring Suppl (CONTOUR NEXT EZ) w/Device KIT, 1 each by Does not apply route daily at 12 noon., Disp: 1 kit, Rfl: 0   EPINEPHRINE, ANAPHYLAXIS THERAPY AGENTS,, EPIPEN 2-PAK, 0.3MG/0.3ML (Injection Device)  1 Device use as directed for 30 days  Quantity: 2;  Refills: 1   Ordered :06-Mar-2012  Steele Sizer MD;  Marisa Sprinkles Active Comments: DX: 989.5, Disp: , Rfl:    glucose blood (CONTOUR NEXT TEST) test strip, Use as instructed, Disp: 100 each, Rfl: 12   insulin glargine, 1 Unit Dial, (TOUJEO SOLOSTAR) 300 UNIT/ML Solostar Pen, Inject 30 Units into the skin daily., Disp: 15 mL, Rfl: 1   Insulin Pen Needle (NOVOFINE PEN NEEDLE) 32G X 6 MM MISC, See admin instructions., Disp: , Rfl:    loratadine (CLARITIN) 10 MG tablet, Take 1 tablet (10 mg total) by mouth daily., Disp: 30 tablet, Rfl: 11   metFORMIN (GLUCOPHAGE-XR) 750  MG 24 hr tablet, TAKE 2 TABLETS BY MOUTH  DAILY WITH BREAKFAST, Disp: 180 tablet, Rfl: 3   Microlet Lancets MISC, 1 Device by Does not apply route 3 (three) times daily., Disp: 180 each, Rfl: 3   montelukast (SINGULAIR) 10 MG tablet, Take 1 tablet (10 mg total) by mouth at bedtime., Disp: 90 tablet, Rfl: 1   pantoprazole (PROTONIX) 40 MG tablet, TAKE 1 TABLET BY MOUTH  DAILY, Disp: 90 tablet, Rfl: 3   pregabalin (LYRICA) 75 MG capsule, Take 1 capsule (75 mg total) by mouth 2 (two) times daily., Disp: 180 capsule, Rfl: 0   rosuvastatin (CRESTOR) 40 MG tablet, Take 1  tablet (40 mg total) by mouth daily. In place of Atorvastatin, Disp: 90 tablet, Rfl: 3   RYBELSUS 7 MG TABS, TAKE 1 TABLET BY MOUTH DAILY, Disp: 90 tablet, Rfl: 0   sertraline (ZOLOFT) 100 MG tablet, Take 1 tablet (100 mg total) by mouth daily., Disp: 90 tablet, Rfl: 1   temazepam (RESTORIL) 30 MG capsule, Take 1 capsule (30 mg total) by mouth daily., Disp: 90 capsule, Rfl: 0   triamcinolone ointment (KENALOG) 0.5 %, Apply 1 application topically 2 (two) times daily., Disp: 60 g, Rfl: 0   valACYclovir (VALTREX) 1000 MG tablet, Take 1 tablet (1,000 mg total) by mouth 2 (two) times daily. Prn, Disp: 60 tablet, Rfl: 0   valsartan (DIOVAN) 80 MG tablet, TAKE 1 TABLET BY MOUTH  DAILY, Disp: 90 tablet, Rfl: 3   Vitamin D, Ergocalciferol, (DRISDOL) 1.25 MG (50000 UNIT) CAPS capsule, TAKE 1 CAPSULE BY MOUTH  EVERY 7 DAYS, Disp: 13 capsule, Rfl: 3  Allergies  Allergen Reactions   Ace Inhibitors     cough   Bee Venom     Bee   Dapagliflozin     hematuria   Ozempic (0.25 Or 0.5 Mg-Dose) [Semaglutide(0.25 Or 0.79m-Dos)]     Bloating     I personally reviewed active problem list, medication list, allergies, family history, social history, health maintenance with the patient/caregiver today.   ROS  Constitutional: Negative for fever , positive for weight change.  Respiratory: Negative for cough and shortness of breath.   Cardiovascular:  Negative for chest pain or palpitations.  Gastrointestinal: Negative for abdominal pain, no bowel changes.  Musculoskeletal: Negative for gait problem or joint swelling.  Skin: Negative for rash.  Neurological: Negative for dizziness or headache.  No other specific complaints in a complete review of systems (except as listed in HPI above).   Objective  Vitals:   09/10/21 1143  BP: 132/76  Pulse: 89  Resp: 16  SpO2: 100%  Weight: 220 lb (99.8 kg)  Height: '5\' 7"'  (1.702 m)    Body mass index is 34.46 kg/m.  Physical Exam  Constitutional: Patient appears well-developed and well-nourished. Obese  No distress.  HEENT: head atraumatic, normocephalic, pupils equal and reactive to light, neck supple Cardiovascular: Normal rate, regular rhythm and normal heart sounds.  No murmur heard. No BLE edema. Pulmonary/Chest: Effort normal and breath sounds normal. No respiratory distress. Abdominal: Soft.  There is no tenderness. Psychiatric: Patient has a normal mood and affect. behavior is normal. Judgment and thought content normal.   Recent Results (from the past 2160 hour(s))  POCT HgB A1C     Status: Abnormal   Collection Time: 06/15/21 12:45 PM  Result Value Ref Range   Hemoglobin A1C 8.4 (A) 4.0 - 5.6 %   HbA1c POC (<> result, manual entry)     HbA1c, POC (prediabetic range)     HbA1c, POC (controlled diabetic range)        PHQ2/9: [facility] 09/10/2021 06/15/2021 03/16/2021 01/26/2021 12/11/2020  Decreased Interest 0 1 0 0 1  Down, Depressed, Hopeless 1 1 0 0 0  PHQ - 2 Score 1 2 0 0 1  Altered sleeping 1 0 0 - 0  Tired, decreased energy 1 0 0 - 0  Change in appetite 0 0 0 - 0  Feeling bad or failure about yourself  0 0 0 - 0  Trouble concentrating 1 0 0 - 0  Moving slowly or fidgety/restless 0 0 0 - 0  Suicidal  thoughts 0 0 0 - 0  PHQ-9 Score 4 2 0 - 1  Difficult doing work/chores - Not difficult at all Not difficult at all - -  Some recent data might be  hidden    phq 9 is positive   Fall Risk: Fall Risk  09/10/2021 06/15/2021 03/16/2021 01/26/2021 12/11/2020  Falls in the past year? 0 0 0 0 0  Number falls in past yr: 0 0 0 0 0  Injury with Fall? 0 0 0 0 0  Risk for fall due to : No Fall Risks No Fall Risks - - -  Follow up Falls prevention discussed Falls prevention discussed Falls evaluation completed - -      Functional Status Survey: Is the patient deaf or have difficulty hearing?: No Does the patient have difficulty seeing, even when wearing glasses/contacts?: Yes Does the patient have difficulty concentrating, remembering, or making decisions?: Yes Does the patient have difficulty walking or climbing stairs?: Yes Does the patient have difficulty dressing or bathing?: No Does the patient have difficulty doing errands alone such as visiting a doctor's office or shopping?: No    Assessment & Plan  1. Dyslipidemia associated with type 2 diabetes mellitus ([place])  Continue statin therapy   2. OSA on CPAP  Continue compliance  3. Benign hypertension  At goal   4. Obesity   Discussed with the patient the risk posed by an increased BMI. Discussed importance of portion control, calorie counting and at least 150 minutes of physical activity weekly. Avoid sweet beverages and drink more water. Eat at least 6 servings of fruit and vegetables daily    5. Migraine without aura and without status migrainosus, not intractable   6. Menopausal symptom  - sertraline (ZOLOFT) 100 MG tablet; Take 1 tablet (100 mg total) by mouth daily.  Dispense: 90 tablet; Refill: 1  7. Insomnia, persistent  - temazepam (RESTORIL) 30 MG capsule; Take 1 capsule (30 mg total) by mouth daily.  Dispense: 90 capsule; Refill: 0  8. Lumbar back pain with radiculopathy affecting right lower extremity  - pregabalin (LYRICA) 75 MG capsule; Take 1 capsule (75 mg total) by mouth 3 (three) times daily.  Dispense: 270 capsule; Refill: 1

## 2021-09-10 ENCOUNTER — Encounter: Payer: Self-pay | Admitting: Family Medicine

## 2021-09-10 ENCOUNTER — Ambulatory Visit: Payer: Managed Care, Other (non HMO) | Admitting: Family Medicine

## 2021-09-10 ENCOUNTER — Other Ambulatory Visit: Payer: Self-pay

## 2021-09-10 VITALS — BP 132/76 | HR 89 | Resp 16 | Ht 67.0 in | Wt 220.0 lb

## 2021-09-10 DIAGNOSIS — I10 Essential (primary) hypertension: Secondary | ICD-10-CM

## 2021-09-10 DIAGNOSIS — E785 Hyperlipidemia, unspecified: Secondary | ICD-10-CM

## 2021-09-10 DIAGNOSIS — N951 Menopausal and female climacteric states: Secondary | ICD-10-CM

## 2021-09-10 DIAGNOSIS — E669 Obesity, unspecified: Secondary | ICD-10-CM

## 2021-09-10 DIAGNOSIS — E1169 Type 2 diabetes mellitus with other specified complication: Secondary | ICD-10-CM

## 2021-09-10 DIAGNOSIS — G4733 Obstructive sleep apnea (adult) (pediatric): Secondary | ICD-10-CM | POA: Diagnosis not present

## 2021-09-10 DIAGNOSIS — Z9989 Dependence on other enabling machines and devices: Secondary | ICD-10-CM

## 2021-09-10 DIAGNOSIS — G43009 Migraine without aura, not intractable, without status migrainosus: Secondary | ICD-10-CM

## 2021-09-10 DIAGNOSIS — M5416 Radiculopathy, lumbar region: Secondary | ICD-10-CM

## 2021-09-10 DIAGNOSIS — G4700 Insomnia, unspecified: Secondary | ICD-10-CM

## 2021-09-10 DIAGNOSIS — E66811 Obesity, class 1: Secondary | ICD-10-CM

## 2021-09-10 MED ORDER — SERTRALINE HCL 100 MG PO TABS: 100.0000 mg | ORAL_TABLET | Freq: Every day | ORAL | 1 refills | Status: DC

## 2021-09-10 MED ORDER — TEMAZEPAM 30 MG PO CAPS: 30.0000 mg | ORAL_CAPSULE | Freq: Every day | ORAL | 0 refills | Status: DC

## 2021-09-10 MED ORDER — PREGABALIN 75 MG PO CAPS: 75.0000 mg | ORAL_CAPSULE | Freq: Three times a day (TID) | ORAL | 1 refills | Status: DC

## 2021-09-11 ENCOUNTER — Other Ambulatory Visit: Payer: Self-pay | Admitting: Family Medicine

## 2021-09-11 MED ORDER — RYBELSUS 14 MG PO TABS: 14.0000 mg | ORAL_TABLET | Freq: Every day | ORAL | 0 refills | Status: DC

## 2021-09-15 ENCOUNTER — Other Ambulatory Visit: Payer: Self-pay | Admitting: Nurse Practitioner

## 2021-09-17 NOTE — Telephone Encounter (Signed)
Requested medication (s) are due for refill today: Due 09/22/21  Requested medication (s) are on the active medication list: yes    Last refill: 06/22/21    #12  Future visit scheduled Yes 10/09/21 Notes to clinic:no delegated, please review.  Requested Prescriptions  Pending Prescriptions Disp Refills   Cholecalciferol (VITAMIN D3) 1.25 MG (50000 UT) CAPS [Pharmacy Med Name: VIT D3 CAP (50,000U)] 12 capsule     Sig: TAKE 1 CAPSULE BY MOUTH  EVERY 7 DAYS     Endocrinology:  Vitamins - Vitamin D Supplementation 2 Failed - 09/15/2021 12:09 PM      Failed - Manual Review: Route requests for 50,000 IU strength to the provider      Passed - Ca in normal range and within 360 days    Calcium  Date Value Ref Range Status  10/02/2020 9.6 8.7 - 10.2 mg/dL Final          Passed - Vitamin D in normal range and within 360 days    Vit D, 25-Hydroxy  Date Value Ref Range Status  10/02/2020 49.7 30.0 - 100.0 ng/mL Final    Comment:    Vitamin D deficiency has been defined by the Institute of Medicine and an Endocrine Society practice guideline as a level of serum 25-OH vitamin D less than 20 ng/mL (1,2). The Endocrine Society went on to further define vitamin D insufficiency as a level between 21 and 29 ng/mL (2). 1. IOM (Institute of Medicine). 2010. Dietary reference    intakes for calcium and D. [place]: The    [employer]. 2. Holick MF, Binkley Lorraine, Bischoff-Ferrari HA, et al.    Evaluation, treatment, and prevention of vitamin D    deficiency: an Endocrine Society clinical practice    guideline. JCEM. 2011 Jul; 96(7):1911-30.           Passed - Valid encounter within last 12 months    Recent Outpatient Visits           1 week ago Dyslipidemia associated with type 2 diabetes mellitus [facility])   [facility] [place], Danna Hefty, MD   3 months ago Dyslipidemia associated with type 2 diabetes mellitus [facility])   [facility]  Alba Cory, MD   6 months ago Dyslipidemia associated with type 2 diabetes mellitus [facility])   [facility] Alba Cory, MD   7 months ago Sciatic nerve pain, right   [facility] Alba Cory, MD   9 months ago Diabetic polyneuropathy associated with type 2 diabetes mellitus [facility])   [facility] Alba Cory, MD       Future Appointments             In 3 weeks Alba Cory, MD [facility]   In 3 months Alba Cory, MD [facility]

## 2021-09-19 ENCOUNTER — Other Ambulatory Visit: Payer: Self-pay

## 2021-09-19 DIAGNOSIS — E1169 Type 2 diabetes mellitus with other specified complication: Secondary | ICD-10-CM

## 2021-09-19 DIAGNOSIS — E785 Hyperlipidemia, unspecified: Secondary | ICD-10-CM

## 2021-09-19 MED ORDER — EASY TOUCH ALCOHOL PREP MEDIUM 70 % PADS: 1.0000 | MEDICATED_PAD | Freq: Three times a day (TID) | 3 refills | Status: DC | PRN

## 2021-09-19 MED ORDER — CONTOUR NEXT TEST VI STRP: ORAL_STRIP | 12 refills | Status: DC

## 2021-09-29 ENCOUNTER — Encounter: Payer: Self-pay | Admitting: Family Medicine

## 2021-10-01 ENCOUNTER — Other Ambulatory Visit: Payer: Self-pay

## 2021-10-01 DIAGNOSIS — G4700 Insomnia, unspecified: Secondary | ICD-10-CM

## 2021-10-01 MED ORDER — TEMAZEPAM 30 MG PO CAPS: 30.0000 mg | ORAL_CAPSULE | Freq: Every day | ORAL | 0 refills | Status: DC

## 2021-10-04 ENCOUNTER — Other Ambulatory Visit: Payer: Self-pay | Admitting: Family Medicine

## 2021-10-04 DIAGNOSIS — G4700 Insomnia, unspecified: Secondary | ICD-10-CM

## 2021-10-04 MED ORDER — TEMAZEPAM 30 MG PO CAPS: 30.0000 mg | ORAL_CAPSULE | Freq: Every day | ORAL | 0 refills | Status: DC

## 2021-10-08 NOTE — Progress Notes (Unsigned)
Name: Joyce Blackburn   MRN: [id number]    DOB: Aug 17, 1960   Date:10/08/2021       Progress Note  Subjective  Chief Complaint  Follow Up  HPI  DMII: She states glucose fasting has been around 150 but goes up to 175 's after meals.  A1C still up at 10.4 % done at work on 03/08/2021  She is on  Metformin and Toujeo 50 units and A1C went down from 10.4 % to 8.4 % , off SGL-2 agonist due to recurrent yeast infection  Taking ARB and statin therapy , she states neuropathy is better, symptoms no longer daily. She denies polyphagia, she has polydipsia and polyuria. She has dyslipidemia and obesity She continues to gain weight. She is not sure if she is taking Rybelsus. She will let me know today, if she is compliant with medication we will increase dose to 14 mg otherwise we will switch from toujeo to Xultophy 50 units daily. She just had intolerance to Ozempic. She will return in one month for follow up  GERD: she also has a history of gastric ulcer over 20  years ago. She states occasionally still has nausea with certain smells, like grease. She was seen by Dr. Durwin Reges and had multiple labs done for evaluation of fatty liver, HIDA was normal. She is on Pantoprazole ( Dexilant no longer covered)  and symptoms have been better controlled. Unchanged    OSA: she has been wearing CPAP every night, morning headaches resolved since last visit . Continue compliance    Dyslipidemia: she is current taking Rosuvastatin , LDL is down to 46,  no side effects . Continue medications    HTN: bp is at goal, she denies pain, palpitation, SOB  or dizziness .    Vitamin D deficiency: continue supplementation   Menopause/major depression mild : she states Zoloft 100 mg  and states hot flashes is still present but night sweats has improved she is doing well, she was feeling more emotional and we added Abilify back in 02/2020. She is doing better, PHq 9 negative    Insomnia: she is taking Temazepam and it helps her fall and  stay asleep, no side effects of medications  - denies daytime somnolence.    Obesity : gaining weight but likely because diabetes is getting under control, try to eat healthy meals and monitor   Migraine headaches:  Episodes are starting suddenly associated with nausea and vomiting. She denies any recent episodes, none in the past month. She states Roselyn Meier works but was making her sleepy. No recent episodes   B12 deficiency: no paresthesia, taking sublingual B12 . Unchanged   Sciatica :long history of recurrent lower back pain since 2019 , but since 2022  has intermittent radiculitis . She  took prednisone in July 2022, we gave her Lyrica and tylenol #3. She states Lyrica is helping seems to be helping, right now is zero , but can up to 4/10 and shoots down right lower leg. We will increase lyrica to take up to two at night.   Patient Active Problem List   Diagnosis Date Noted   Major depression in remission ([place]) 12/19/2017   Fatty liver 03/04/2016   Vitamin D deficiency 03/04/2016   Gastroesophageal reflux disease without esophagitis 07/04/2015   Diabetic polyneuropathy associated with type 2 diabetes mellitus ([place]) 03/03/2015   Peripheral neuropathy 03/03/2015   Allergic to bees 02/20/2015   Benign hypertension 02/20/2015   Insomnia, persistent 02/20/2015   Chronic idiopathic constipation  02/20/2015   Dyslipidemia 02/20/2015   Dermatitis, eczematoid 02/20/2015   H/O gastric ulcer 02/20/2015   Herpes 02/20/2015   Migraine without aura and responsive to treatment 02/20/2015   Morbid obesity ([place]) 02/20/2015   Obstructive apnea 02/20/2015   Paresthesia of arm 02/20/2015   Periodic limb movement 02/20/2015   Allergic rhinitis, seasonal 02/20/2015   Menopausal symptom 02/20/2015    Past Surgical History:  Procedure Laterality Date   ABDOMINAL HYSTERECTOMY     COLONOSCOPY WITH PROPOFOL N/A 05/26/2020   Procedure: COLONOSCOPY WITH PROPOFOL;  Surgeon: Jonathon Bellows, MD;  Location:  [facility] ENDOSCOPY;  Service: Gastroenterology;  Laterality: N/A;   RETINAL TEAR REPAIR CRYOTHERAPY Right    [place] (Dr. Noel Journey)    Family History  Problem Relation Age of Onset   Mental illness Mother    Diabetes Mother    CVA Mother    Schizophrenia Mother    Diabetes Sister    Hypertension Sister    Diabetes Brother    Mental illness Brother    Bipolar disorder Brother    Schizophrenia Brother     Social History   Tobacco Use   Smoking status: Former    Packs/day: 0.10    Years: 15.00    Pack years: 1.50    Types: Cigarettes    Quit date: 07/29/2004    Years since quitting: 17.2   Smokeless tobacco: Never  Substance Use Topics   Alcohol use: No    Alcohol/week: 0.0 standard drinks     Current Outpatient Medications:    Cholecalciferol (VITAMIN D3) 1.25 MG (50000 UT) CAPS, TAKE 1 CAPSULE BY MOUTH  EVERY 7 DAYS, Disp: 12 capsule, Rfl: 0   acetaminophen-codeine (TYLENOL #3) 300-30 MG tablet, Take 1 tablet by mouth every 4 (four) hours as needed for moderate pain., Disp: 30 tablet, Rfl: 0   Alcohol Swabs (EASY TOUCH ALCOHOL PREP MEDIUM) 70 % PADS, 1 each by Does not apply route 3 (three) times daily as needed., Disp: 200 each, Rfl: 3   ARIPiprazole (ABILIFY) 2 MG tablet, TAKE 1 TABLET BY MOUTH  DAILY, Disp: 90 tablet, Rfl: 3   aspirin 81 MG tablet, Take 81 mg by mouth daily., Disp: , Rfl:    Blood Glucose Monitoring Suppl (CONTOUR NEXT EZ) w/Device KIT, 1 each by Does not apply route daily at 12 noon., Disp: 1 kit, Rfl: 0   EPINEPHRINE, ANAPHYLAXIS THERAPY AGENTS,, EPIPEN 2-PAK, 0.3MG/0.3ML (Injection Device)  1 Device use as directed for 30 days  Quantity: 2;  Refills: 1   Ordered :06-Mar-2012  Steele Sizer MD;  Marisa Sprinkles Active Comments: DX: 989.5, Disp: , Rfl:    glucose blood (CONTOUR NEXT TEST) test strip, Use as instructed, Disp: 100 each, Rfl: 12   Insulin Pen Needle (NOVOFINE PEN NEEDLE) 32G X 6 MM MISC, See admin instructions., Disp: , Rfl:     loratadine (CLARITIN) 10 MG tablet, Take 1 tablet (10 mg total) by mouth daily., Disp: 30 tablet, Rfl: 11   metFORMIN (GLUCOPHAGE-XR) 750 MG 24 hr tablet, TAKE 2 TABLETS BY MOUTH  DAILY WITH BREAKFAST, Disp: 180 tablet, Rfl: 3   Microlet Lancets MISC, 1 Device by Does not apply route 3 (three) times daily., Disp: 180 each, Rfl: 3   montelukast (SINGULAIR) 10 MG tablet, Take 1 tablet (10 mg total) by mouth at bedtime., Disp: 90 tablet, Rfl: 1   pantoprazole (PROTONIX) 40 MG tablet, TAKE 1 TABLET BY MOUTH  DAILY, Disp: 90 tablet, Rfl: 3   pregabalin (LYRICA)  75 MG capsule, Take 1 capsule (75 mg total) by mouth 3 (three) times daily., Disp: 270 capsule, Rfl: 1   rosuvastatin (CRESTOR) 40 MG tablet, Take 1 tablet (40 mg total) by mouth daily. In place of Atorvastatin, Disp: 90 tablet, Rfl: 3   Semaglutide (RYBELSUS) 14 MG TABS, Take 1 tablet (14 mg total) by mouth daily., Disp: 90 tablet, Rfl: 0   sertraline (ZOLOFT) 100 MG tablet, Take 1 tablet (100 mg total) by mouth daily., Disp: 90 tablet, Rfl: 1   temazepam (RESTORIL) 30 MG capsule, Take 1 capsule (30 mg total) by mouth daily., Disp: 30 capsule, Rfl: 0   triamcinolone ointment (KENALOG) 0.5 %, Apply 1 application topically 2 (two) times daily., Disp: 60 g, Rfl: 0   valACYclovir (VALTREX) 1000 MG tablet, Take 1 tablet (1,000 mg total) by mouth 2 (two) times daily. Prn, Disp: 60 tablet, Rfl: 0   valsartan (DIOVAN) 80 MG tablet, TAKE 1 TABLET BY MOUTH  DAILY, Disp: 90 tablet, Rfl: 3  Allergies  Allergen Reactions   Ace Inhibitors     cough   Bee Venom     Bee   Dapagliflozin     hematuria   Ozempic (0.25 Or 0.5 Mg-Dose) [Semaglutide(0.25 Or 0.48m-Dos)]     Bloating     I personally reviewed active problem list, medication list, allergies, family history, social history, health maintenance with the patient/caregiver today.   ROS  ***  Objective  There were no vitals filed for this visit.  There is no height or weight on file to  calculate BMI.  Physical Exam ***  No results found for this or any previous visit (from the past 2160 hour(s)).  Diabetic Foot Exam: Diabetic Foot Exam - Simple   No data filed    ***  PHQ2/9: [facility] 09/10/2021 06/15/2021 03/16/2021 01/26/2021 12/11/2020  Decreased Interest 0 1 0 0 1  Down, Depressed, Hopeless 1 1 0 0 0  PHQ - 2 Score 1 2 0 0 1  Altered sleeping 1 0 0 - 0  Tired, decreased energy 1 0 0 - 0  Change in appetite 0 0 0 - 0  Feeling bad or failure about yourself  0 0 0 - 0  Trouble concentrating 1 0 0 - 0  Moving slowly or fidgety/restless 0 0 0 - 0  Suicidal thoughts 0 0 0 - 0  PHQ-9 Score 4 2 0 - 1  Difficult doing work/chores - Not difficult at all Not difficult at all - -  Some recent data might be hidden    phq 9 is {gen pos [id number]}  Fall Risk: Fall Risk  09/10/2021 06/15/2021 03/16/2021 01/26/2021 12/11/2020  Falls in the past year? 0 0 0 0 0  Number falls in past yr: 0 0 0 0 0  Injury with Fall? 0 0 0 0 0  Risk for fall due to : No Fall Risks No Fall Risks - - -  Follow up Falls prevention discussed Falls prevention discussed Falls evaluation completed - -      Functional Status Survey:      Assessment & Plan  *** There are no diagnoses linked to this encounter.

## 2021-10-09 ENCOUNTER — Encounter: Payer: Self-pay | Admitting: Family Medicine

## 2021-10-09 ENCOUNTER — Ambulatory Visit: Payer: Managed Care, Other (non HMO) | Admitting: Family Medicine

## 2021-10-09 VITALS — BP 132/70 | HR 95 | Resp 16 | Ht 67.0 in | Wt 218.0 lb

## 2021-10-09 DIAGNOSIS — F330 Major depressive disorder, recurrent, mild: Secondary | ICD-10-CM

## 2021-10-09 DIAGNOSIS — I10 Essential (primary) hypertension: Secondary | ICD-10-CM | POA: Diagnosis not present

## 2021-10-09 DIAGNOSIS — E1159 Type 2 diabetes mellitus with other circulatory complications: Secondary | ICD-10-CM

## 2021-10-09 DIAGNOSIS — E559 Vitamin D deficiency, unspecified: Secondary | ICD-10-CM

## 2021-10-09 DIAGNOSIS — E538 Deficiency of other specified B group vitamins: Secondary | ICD-10-CM

## 2021-10-09 DIAGNOSIS — G4700 Insomnia, unspecified: Secondary | ICD-10-CM | POA: Diagnosis not present

## 2021-10-09 DIAGNOSIS — J302 Other seasonal allergic rhinitis: Secondary | ICD-10-CM

## 2021-10-09 DIAGNOSIS — E1169 Type 2 diabetes mellitus with other specified complication: Secondary | ICD-10-CM

## 2021-10-09 DIAGNOSIS — G43009 Migraine without aura, not intractable, without status migrainosus: Secondary | ICD-10-CM

## 2021-10-09 DIAGNOSIS — K219 Gastro-esophageal reflux disease without esophagitis: Secondary | ICD-10-CM

## 2021-10-09 DIAGNOSIS — I152 Hypertension secondary to endocrine disorders: Secondary | ICD-10-CM

## 2021-10-09 DIAGNOSIS — G4733 Obstructive sleep apnea (adult) (pediatric): Secondary | ICD-10-CM

## 2021-10-09 DIAGNOSIS — E785 Hyperlipidemia, unspecified: Secondary | ICD-10-CM

## 2021-10-09 DIAGNOSIS — Z9989 Dependence on other enabling machines and devices: Secondary | ICD-10-CM

## 2021-10-09 MED ORDER — RYBELSUS 14 MG PO TABS: 14.0000 mg | ORAL_TABLET | Freq: Every day | ORAL | 0 refills | Status: DC

## 2021-10-09 MED ORDER — NOVOFINE PEN NEEDLE 32G X 6 MM MISC: 1.0000 | 5 refills | Status: DC

## 2021-10-09 MED ORDER — TOUJEO MAX SOLOSTAR 300 UNIT/ML ~~LOC~~ SOPN: 50.0000 [IU] | PEN_INJECTOR | Freq: Every day | SUBCUTANEOUS | 0 refills | Status: DC

## 2021-10-09 MED ORDER — MONTELUKAST SODIUM 10 MG PO TABS: 10.0000 mg | ORAL_TABLET | Freq: Every day | ORAL | 1 refills | Status: DC

## 2021-10-09 MED ORDER — TEMAZEPAM 15 MG PO CAPS: 30.0000 mg | ORAL_CAPSULE | Freq: Every day | ORAL | 0 refills | Status: DC

## 2021-10-12 ENCOUNTER — Encounter: Payer: Self-pay | Admitting: Family Medicine

## 2021-10-29 ENCOUNTER — Other Ambulatory Visit: Payer: Self-pay

## 2021-10-29 DIAGNOSIS — E1169 Type 2 diabetes mellitus with other specified complication: Secondary | ICD-10-CM

## 2021-10-29 DIAGNOSIS — E1140 Type 2 diabetes mellitus with diabetic neuropathy, unspecified: Secondary | ICD-10-CM

## 2021-10-29 DIAGNOSIS — E1142 Type 2 diabetes mellitus with diabetic polyneuropathy: Secondary | ICD-10-CM

## 2021-10-29 MED ORDER — DEXCOM G6 SENSOR MISC: 1.0000 | 1 refills | Status: DC

## 2021-10-29 MED ORDER — DEXCOM G6 TRANSMITTER MISC: 1.0000 | 1 refills | Status: DC

## 2021-11-02 ENCOUNTER — Telehealth: Payer: Self-pay | Admitting: Family Medicine

## 2021-11-02 ENCOUNTER — Other Ambulatory Visit: Payer: Self-pay | Admitting: Family Medicine

## 2021-11-02 MED ORDER — DEXCOM G6 TRANSMITTER MISC: 1.0000 | 1 refills | Status: DC

## 2021-11-02 NOTE — Telephone Encounter (Signed)
Copied from CRM [id number]. Topic: General - Other ?>> Nov 02, 2021  9:24 AM Jaquita Rector A wrote: ?Reason for CRM: Sue Lush with [facility] called in needing a direction on the Continuous Blood Gluc Transmit (DEXCOM G6 TRANSMITTER) MISC need the direction change for every 90 Days and not every 6 months. Ph# [phone] ?

## 2021-11-16 ENCOUNTER — Other Ambulatory Visit: Payer: Self-pay | Admitting: Family Medicine

## 2021-11-16 DIAGNOSIS — E1169 Type 2 diabetes mellitus with other specified complication: Secondary | ICD-10-CM

## 2021-11-29 ENCOUNTER — Other Ambulatory Visit: Payer: Self-pay | Admitting: Family Medicine

## 2021-12-17 ENCOUNTER — Ambulatory Visit: Payer: Managed Care, Other (non HMO) | Admitting: Family Medicine

## 2021-12-26 ENCOUNTER — Other Ambulatory Visit: Payer: Self-pay

## 2021-12-26 ENCOUNTER — Ambulatory Visit: Payer: Managed Care, Other (non HMO) | Admitting: Nurse Practitioner

## 2021-12-26 ENCOUNTER — Encounter: Payer: Self-pay | Admitting: Nurse Practitioner

## 2021-12-26 VITALS — BP 136/74 | HR 97 | Temp 98.3°F | Resp 18 | Ht 67.0 in | Wt 213.3 lb

## 2021-12-26 DIAGNOSIS — N951 Menopausal and female climacteric states: Secondary | ICD-10-CM

## 2021-12-26 DIAGNOSIS — E1140 Type 2 diabetes mellitus with diabetic neuropathy, unspecified: Secondary | ICD-10-CM | POA: Diagnosis not present

## 2021-12-26 DIAGNOSIS — E1142 Type 2 diabetes mellitus with diabetic polyneuropathy: Secondary | ICD-10-CM | POA: Diagnosis not present

## 2021-12-26 DIAGNOSIS — E559 Vitamin D deficiency, unspecified: Secondary | ICD-10-CM

## 2021-12-26 DIAGNOSIS — E1159 Type 2 diabetes mellitus with other circulatory complications: Secondary | ICD-10-CM

## 2021-12-26 DIAGNOSIS — Z9989 Dependence on other enabling machines and devices: Secondary | ICD-10-CM

## 2021-12-26 DIAGNOSIS — K219 Gastro-esophageal reflux disease without esophagitis: Secondary | ICD-10-CM

## 2021-12-26 DIAGNOSIS — M5431 Sciatica, right side: Secondary | ICD-10-CM

## 2021-12-26 DIAGNOSIS — G4700 Insomnia, unspecified: Secondary | ICD-10-CM

## 2021-12-26 DIAGNOSIS — G43009 Migraine without aura, not intractable, without status migrainosus: Secondary | ICD-10-CM

## 2021-12-26 DIAGNOSIS — E1169 Type 2 diabetes mellitus with other specified complication: Secondary | ICD-10-CM

## 2021-12-26 DIAGNOSIS — I152 Hypertension secondary to endocrine disorders: Secondary | ICD-10-CM

## 2021-12-26 DIAGNOSIS — E538 Deficiency of other specified B group vitamins: Secondary | ICD-10-CM

## 2021-12-26 DIAGNOSIS — G4733 Obstructive sleep apnea (adult) (pediatric): Secondary | ICD-10-CM

## 2021-12-26 DIAGNOSIS — E785 Hyperlipidemia, unspecified: Secondary | ICD-10-CM

## 2021-12-26 DIAGNOSIS — M5416 Radiculopathy, lumbar region: Secondary | ICD-10-CM

## 2021-12-26 DIAGNOSIS — I10 Essential (primary) hypertension: Secondary | ICD-10-CM

## 2021-12-26 DIAGNOSIS — F330 Major depressive disorder, recurrent, mild: Secondary | ICD-10-CM

## 2021-12-26 NOTE — Assessment & Plan Note (Signed)
Patient reports her depression has been well controlled.  Continue taking Zoloft 100 mg and Abilify.

## 2021-12-26 NOTE — Progress Notes (Signed)
BP 136/74   Pulse 97   Temp 98.3 F (36.8 C) (Oral)   Resp 18   Ht 5\' 7"  (1.702 m)   Wt 213 lb 4.8 oz (96.8 kg)   SpO2 95%   BMI 33.41 kg/m    Subjective:    Patient ID: Joyce Blackburn, female    DOB: April 07, 1961, 61 y.o.   MRN: [id number]  HPI: Joyce Blackburn is a 61 y.o. female  Chief Complaint  Patient presents with   Diabetes   Hypertension   Hyperlipidemia    3 month follow up   Depression   DMII: Patient reports that her fasting glucose has been about 120s.  Her last A1c was 8.4 on 06/15/2021.  She says she got her labs drawn at [facility].  We requested records but do not have those just yet.  She is currently taking metformin 1500 mg daily, Toujeo 50 units daily,  and Rybelsus  14 mg daily.  She does take an ARB and a statin.  She denies any polyuria, polydipsia or polyphasia.  GERD: she also has a history of gastric ulcer over 20  years ago. She states occasionally still has nausea with certain smells, like grease. She was seen by Dr. Durwin Reges and had multiple labs done for evaluation of fatty liver, HIDA was normal.  She is currently taking Protonix 40 mg daily.  She says since being on the medication she has had no other issues.   OSA: She says she continues to use her CPAP every night.  She denies any headaches upon waking and feels well rested.   Dyslipidemia: Her last LDL was 46 on 03/08/2021.  Is currently taking rosuvastatin 40 mg daily.  She denies any myalgia.   HTN: Her blood pressure today was 136/74.  She does not check her blood pressure at home.  She denies any chest pain, shortness of breath, blurred vision or headaches.  She is currently taking valsartan 80 mg daily.   Vitamin D deficiency/ vitamin B 12 supplement: Patient reports that she is no longer taking vitamin D or vitamin B 12 supplementation.  Patient states she recently had her lab work done at [facility].  [employer].   Menopause/major depression mild : she states Zoloft 100 mg  and states hot  flashes is still present but night sweats has improved she is doing well, she was feeling more emotional and we added Abilify back in 02/2020. Continue current regiment patient reports that her depression is well controlled.  She also says that her hot flashes have been less.  She says she is doing great.      12/26/2021    8:23 AM 10/09/2021    2:03 PM 09/10/2021   11:42 AM 06/15/2021   10:28 AM 03/16/2021    8:39 AM  Depression screen PHQ 2/9  Decreased Interest 2 0 0 1 0  Down, Depressed, Hopeless 1 0 1 1 0  PHQ - 2 Score 3 0 1 2 0  Altered sleeping 0 0 1 0 0  Tired, decreased energy 1 0 1 0 0  Change in appetite 0 0 0 0 0  Feeling bad or failure about yourself  0 0 0 0 0  Trouble concentrating 0 0 1 0 0  Moving slowly or fidgety/restless 0 0 0 0 0  Suicidal thoughts 0 0 0 0 0  PHQ-9 Score 4 0 4 2 0  Difficult doing work/chores Not difficult at all   Not difficult at all  Not difficult at all    Insomnia: She is currently taking temazepam 30 mg at night.  She says she is sleeping fine.   Obesity : Patient has had a 5 pound weight loss since last visit.  She says she is trying to eat better.   Migraine headaches:  Episodes are starting suddenly associated with nausea and vomiting.  Patient reports that she has not had any episodes of migraines recently.    Sciatica :long history of recurrent lower back pain since 2019 , but since 2022  has intermittent radiculitis . She  took prednisone in July 2022, we gave her Lyrica and tylenol #3.  She says the Lyrica has really been helpful for her pain.  She is currently taking Lyrica 75 mg 3 times a day.   Relevant past medical, surgical, family and social history reviewed and updated as indicated. Interim medical history since our last visit reviewed. Allergies and medications reviewed and updated.  Review of Systems  Constitutional: Negative for fever or weight change.  Respiratory: Negative for cough and shortness of breath.    Cardiovascular: Negative for chest pain or palpitations.  Gastrointestinal: Negative for abdominal pain, no bowel changes.  Musculoskeletal: Negative for gait problem or joint swelling.  Skin: Negative for rash.  Neurological: Negative for dizziness or headache.  No other specific complaints in a complete review of systems (except as listed in HPI above).      Objective:    BP 136/74   Pulse 97   Temp 98.3 F (36.8 C) (Oral)   Resp 18   Ht 5\' 7"  (1.702 m)   Wt 213 lb 4.8 oz (96.8 kg)   SpO2 95%   BMI 33.41 kg/m   Wt Readings from Last 3 Encounters:  12/26/21 213 lb 4.8 oz (96.8 kg)  10/09/21 218 lb (98.9 kg)  09/10/21 220 lb (99.8 kg)    Physical Exam  Results for orders placed or performed in visit on 06/15/21  POCT HgB A1C  Result Value Ref Range   Hemoglobin A1C 8.4 (A) 4.0 - 5.6 %   HbA1c POC (<> result, manual entry)     HbA1c, POC (prediabetic range)     HbA1c, POC (controlled diabetic range)        Assessment & Plan:   Problem List Items Addressed This Visit       Cardiovascular and Mediastinum   Benign hypertension    Blood pressure at goal.  Continue taking valsartan 80 mg daily.       Migraine without aura and without status migrainosus, not intractable    She says she has not had any migraines recently.         Respiratory   OSA on CPAP    Continue using CPAP every night.         Digestive   Gastroesophageal reflux disease without esophagitis    Patient reports symptoms are well controlled with medication.  Continue taking Protonix 40 mg daily.         Endocrine   Diabetic polyneuropathy associated with type 2 diabetes mellitus [facility])    Patient reports that she recently had blood work done at [facility].  We do not have those records at this time.  But we have requested them.  She is currently taking metformin 1500 mg daily, Toujeo 50 units daily, and Rybelsus 14 mg daily.  Says her fasting glucose has been about 120s.        Controlled type 2 diabetes with neuropathy (  [place]) - Primary     Nervous and Auditory   Lumbar back pain with radiculopathy affecting right lower extremity   Sciatic nerve pain, right     Other   Insomnia, persistent    Continue using temazepam 30 mg at night.  Patient reports her sleeping has gotten much better.       Morbid obesity ([place])   Menopausal symptom    Patient reports that her hot flashes have improved.       Vitamin D deficiency    Reports she is no longer taking supplementation.       Mild recurrent major depression ([place])    Patient reports her depression has been well controlled.  Continue taking Zoloft 100 mg and Abilify.       Other Visit Diagnoses     Hypertension associated with diabetes ([place])       Blood pressure at goal.   B12 deficiency       Patient reports she is no longer taking supplementation.        Follow up plan: Return in about 3 months (around 03/28/2022) for follow up.

## 2021-12-26 NOTE — Assessment & Plan Note (Signed)
Continue using CPAP every night.  

## 2021-12-26 NOTE — Assessment & Plan Note (Signed)
Reports she is no longer taking supplementation.

## 2021-12-26 NOTE — Assessment & Plan Note (Signed)
Patient reports that her hot flashes have improved.

## 2021-12-26 NOTE — Assessment & Plan Note (Signed)
Continue using temazepam 30 mg at night.  Patient reports her sleeping has gotten much better.

## 2021-12-26 NOTE — Assessment & Plan Note (Signed)
Patient reports that she recently had blood work done at [facility].  We do not have those records at this time.  But we have requested them.  She is currently taking metformin 1500 mg daily, Toujeo 50 units daily, and Rybelsus 14 mg daily.  Says her fasting glucose has been about 120s.

## 2021-12-26 NOTE — Assessment & Plan Note (Signed)
Patient reports symptoms are well controlled with medication.  Continue taking Protonix 40 mg daily.

## 2021-12-26 NOTE — Assessment & Plan Note (Signed)
She says she has not had any migraines recently.

## 2021-12-26 NOTE — Assessment & Plan Note (Signed)
Blood pressure at goal.  Continue taking valsartan 80 mg daily.

## 2022-01-19 ENCOUNTER — Other Ambulatory Visit: Payer: Self-pay | Admitting: Family Medicine

## 2022-01-26 ENCOUNTER — Other Ambulatory Visit: Payer: Self-pay | Admitting: Family Medicine

## 2022-01-26 DIAGNOSIS — G4700 Insomnia, unspecified: Secondary | ICD-10-CM

## 2022-01-29 ENCOUNTER — Other Ambulatory Visit: Payer: Self-pay | Admitting: Family Medicine

## 2022-01-29 DIAGNOSIS — G4700 Insomnia, unspecified: Secondary | ICD-10-CM

## 2022-01-30 MED ORDER — TEMAZEPAM 30 MG PO CAPS: 30.0000 mg | ORAL_CAPSULE | Freq: Every day | ORAL | 0 refills | Status: DC

## 2022-03-08 ENCOUNTER — Other Ambulatory Visit: Payer: Self-pay | Admitting: Nurse Practitioner

## 2022-03-08 ENCOUNTER — Other Ambulatory Visit: Payer: Self-pay | Admitting: Family Medicine

## 2022-03-08 DIAGNOSIS — J302 Other seasonal allergic rhinitis: Secondary | ICD-10-CM

## 2022-03-08 DIAGNOSIS — E1142 Type 2 diabetes mellitus with diabetic polyneuropathy: Secondary | ICD-10-CM

## 2022-03-08 DIAGNOSIS — N951 Menopausal and female climacteric states: Secondary | ICD-10-CM

## 2022-03-08 DIAGNOSIS — K219 Gastro-esophageal reflux disease without esophagitis: Secondary | ICD-10-CM

## 2022-03-08 NOTE — Telephone Encounter (Signed)
Request sent via Interface, pt has refills available. LRF both meds 05/04/21, year supply Requested Prescriptions  Pending Prescriptions Disp Refills  . pantoprazole (PROTONIX) 40 MG tablet [Pharmacy Med Name: Pantoprazole Sodium 40 MG Oral Tablet Delayed Release] 90 tablet 3    Sig: TAKE 1 TABLET BY MOUTH  DAILY     Gastroenterology: Proton Pump Inhibitors Passed - 03/08/2022  8:18 AM      Passed - Valid encounter within last 12 months    Recent Outpatient Visits          2 months ago Dyslipidemia associated with type 2 diabetes mellitus [facility])   [facility] Bo Merino, FNP   5 months ago Dyslipidemia associated with type 2 diabetes mellitus [facility])   [facility] Steele Sizer, MD   5 months ago Dyslipidemia associated with type 2 diabetes mellitus [facility])   [facility] Steele Sizer, MD   8 months ago Dyslipidemia associated with type 2 diabetes mellitus [facility])   [facility] Steele Sizer, MD   11 months ago Dyslipidemia associated with type 2 diabetes mellitus [facility])   [facility] Steele Sizer, MD      Future Appointments            In 3 weeks Steele Sizer, MD [facility]           . metFORMIN (GLUCOPHAGE-XR) 750 MG 24 hr tablet [Pharmacy Med Name: metFORMIN HCl ER 750 MG Oral Tablet Extended Release 24 Hour] 180 tablet 3    Sig: TAKE 2 TABLETS BY [place]     Endocrinology:  Diabetes - Biguanides Failed - 03/08/2022  8:18 AM      Failed - Cr in normal range and within 360 days    Creatinine, Ser  Date Value Ref Range Status  10/02/2020 0.86 0.57 - 1.00 mg/dL Final         Failed - HBA1C is between 0 and 7.9 and within 180 days    Hemoglobin A1C  Date Value Ref Range Status  06/15/2021 8.4 (A) 4.0 - 5.6 % Final  03/08/2021 10.4  Final         Failed - eGFR in normal range and within 360 days    GFR calc Af Amer   Date Value Ref Range Status  09/29/2019 92 >59 mL/min/1.73 Final   GFR calc non Af Amer  Date Value Ref Range Status  09/29/2019 80 >59 mL/min/1.73 Final   eGFR  Date Value Ref Range Status  10/02/2020 78 >59 mL/min/1.73 Final    Comment:    **In accordance with recommendations from the NKF-ASN Task force,**   [facility] has updated its eGFR calculation to the 2021 CKD-EPI   creatinine equation that estimates kidney function without a race   variable.          Failed - CBC within normal limits and completed in the last 12 months    WBC  Date Value Ref Range Status  10/02/2020 7.5 3.4 - 10.8 x10E3/uL Final   RBC  Date Value Ref Range Status  10/02/2020 4.65 3.77 - 5.28 x10E6/uL Final   Hemoglobin  Date Value Ref Range Status  10/02/2020 12.4 11.1 - 15.9 g/dL Final   Hematocrit  Date Value Ref Range Status  10/02/2020 38.4 34.0 - 46.6 % Final   MCHC  Date Value Ref Range Status  10/02/2020 32.3 31.5 - 35.7 g/dL Final   [facility]  Date Value Ref Range Status  10/02/2020 26.7 26.6 - 33.0 pg Final   MCV  Date Value Ref Range Status  10/02/2020 83 79 - 97 fL Final   No results found for: "PLTCOUNTKUC", "LABPLAT", "POCPLA" RDW  Date Value Ref Range Status  10/02/2020 14.0 11.7 - 15.4 % Final         Passed - B12 Level in normal range and within 720 days    Vitamin B-12  Date Value Ref Range Status  10/02/2020 232 232 - 1,245 pg/mL Final         Passed - Valid encounter within last 6 months    Recent Outpatient Visits          2 months ago Dyslipidemia associated with type 2 diabetes mellitus [facility])   [facility] Serafina Royals F, FNP   5 months ago Dyslipidemia associated with type 2 diabetes mellitus [facility])   [facility] Steele Sizer, MD   5 months ago Dyslipidemia associated with type 2 diabetes mellitus [facility])   [facility] [place], Drue Stager, MD   8 months ago Dyslipidemia associated with type 2  diabetes mellitus [facility])   [facility] Steele Sizer, MD   11 months ago Dyslipidemia associated with type 2 diabetes mellitus [facility])   [facility] Steele Sizer, MD      Future Appointments            In 3 weeks Ancil Boozer, Drue Stager, MD [facility], [facility]

## 2022-03-28 NOTE — Progress Notes (Signed)
Name: Joyce Blackburn   MRN: [id number]    DOB: 07/02/1961   Date:03/29/2022       Progress Note  Subjective  Chief Complaint  Follow Up  HPI  DMII: She states glucose fasting has been between 100-160's Post-prandially 190-250's  A1C still up at 10.4 % done at work 03/08/2021  She is on  Metformin and Toujeo 50 units and A1C went down from 10.4 % to 8.4 % , off SGL-2 agonist due to recurrent yeast infection, she was taking Rybelsus but stopped months ago she does not like the side effects of bloating and bad taste in her mouth Taking ARB and statin therapy , she states neuropathy is better with Lyrica She denies polyphagia, she has polydipsia and polyuria. She has dyslipidemia and obesity. We will add Actos today and if no improvement of A1C by next visit we will add a pre meal insulin once a day.  GERD: she also has a history of gastric ulcer over 20  years ago. She states occasionally still has nausea with certain smells, like grease. She was seen by Dr. Lars Pinks and had multiple labs done for evaluation of fatty liver, HIDA was normal. She is on Pantoprazole ( Dexilant no longer covered)  since she has been off Rybelsus her symptoms have improved    OSA: she has been wearing CPAP every night, morning headaches resolved since last visit . Unchnanged    Dyslipidemia: she is current taking Rosuvastatin , LDL has been at goal .    HTN: bp is at goal, she denies pain, palpitation, SOB  or dizziness .    Vitamin D deficiency: continue supplementation , she stopped taking B12 supplementation on her own, we will recheck labs   Menopause/major depression mild : she states Zoloft 100 mg  and states hot flashes is still present but night sweats has improved she is doing well, she was feeling more emotional and we added Abilify back in 02/2020. Continue current regiment . Phq 9 today is 2    Insomnia: she is taking Temazepam and it helps her fall and stay asleep, no side effects of medications  - denies  daytime somnolence. Continue current regiment    Obesity : weight is stable, she cannot tolerate GLP-1 agonist, discussed low carb diet, or try keto meals for breakfast and lunch   Migraine headaches:  Episodes start suddenly associated with nausea and vomiting.  states Bernita Raisin works but was making her sleepy. No episodes for the past 4 months   Sciatica :long history of recurrent lower back pain since 2019 , but since 2022  has intermittent radiculitis . She  took prednisone in July 2022, we gave her Lyrica and tylenol #3. She states Lyrica is helping with symptoms. No bowel or bladder incontinence   Patient Active Problem List   Diagnosis Date Noted   Mild recurrent major depression (HCC) 12/26/2021   Lumbar back pain with radiculopathy affecting right lower extremity 12/26/2021   Sciatic nerve pain, right 12/26/2021   Major depression in remission (HCC) 12/19/2017   Fatty liver 03/04/2016   Vitamin D deficiency 03/04/2016   Gastroesophageal reflux disease without esophagitis 07/04/2015   Diabetic polyneuropathy associated with type 2 diabetes mellitus (HCC) 03/03/2015   Peripheral neuropathy 03/03/2015   Allergic to bees 02/20/2015   Benign hypertension 02/20/2015   Insomnia, persistent 02/20/2015   Chronic idiopathic constipation 02/20/2015   Dyslipidemia 02/20/2015   Dermatitis, eczematoid 02/20/2015   H/O gastric ulcer 02/20/2015   Herpes 02/20/2015  Migraine without aura and without status migrainosus, not intractable 02/20/2015   Morbid obesity (HCC) 02/20/2015   OSA on CPAP 02/20/2015   Paresthesia of arm 02/20/2015   Periodic limb movement 02/20/2015   Allergic rhinitis, seasonal 02/20/2015   Menopausal symptom 02/20/2015    Past Surgical History:  Procedure Laterality Date   ABDOMINAL HYSTERECTOMY     COLONOSCOPY WITH PROPOFOL N/A 05/26/2020   Procedure: COLONOSCOPY WITH PROPOFOL;  Surgeon: Wyline Mood, MD;  Location: [facility] ENDOSCOPY;  Service: Gastroenterology;   Laterality: N/A;   RETINAL TEAR REPAIR CRYOTHERAPY Right    [facility] (Dr. Robin Searing)    Family History  Problem Relation Age of Onset   Mental illness Mother    Diabetes Mother    CVA Mother    Schizophrenia Mother    Diabetes Sister    Hypertension Sister    Diabetes Brother    Mental illness Brother    Bipolar disorder Brother    Schizophrenia Brother     Social History   Tobacco Use   Smoking status: Former    Packs/day: 0.10    Years: 15.00    Total pack years: 1.50    Types: Cigarettes    Quit date: 07/29/2004    Years since quitting: 17.6   Smokeless tobacco: Never  Substance Use Topics   Alcohol use: No    Alcohol/week: 0.0 standard drinks of alcohol     Current Outpatient Medications:    acetaminophen-codeine (TYLENOL #3) 300-30 MG tablet, Take 1 tablet by mouth every 4 (four) hours as needed for moderate pain., Disp: 30 tablet, Rfl: 0   Alcohol Swabs (EASY TOUCH ALCOHOL PREP MEDIUM) 70 % PADS, 1 each by Does not apply route 3 (three) times daily as needed., Disp: 200 each, Rfl: 3   aspirin 81 MG tablet, Take 81 mg by mouth daily., Disp: , Rfl:    Cholecalciferol (VITAMIN D3) 1.25 MG (50000 UT) CAPS, TAKE 1 CAPSULE BY MOUTH  EVERY 7 DAYS, Disp: 12 capsule, Rfl: 0   EPINEPHRINE, ANAPHYLAXIS THERAPY AGENTS,, EPIPEN 2-PAK, 0.3MG /0.3ML (Injection Device)  1 Device use as directed for 30 days  Quantity: 2;  Refills: 1   Ordered :06-Mar-2012  Alba Cory MD;  Started 06-Mar-2012 Active Comments: DX: 989.5, Disp: , Rfl:    loratadine (CLARITIN) 10 MG tablet, Take 1 tablet (10 mg total) by mouth daily., Disp: 30 tablet, Rfl: 11   pioglitazone (ACTOS) 15 MG tablet, Take 1 tablet (15 mg total) by mouth daily., Disp: 90 tablet, Rfl: 1   rosuvastatin (CRESTOR) 40 MG tablet, TAKE 1 TABLET BY MOUTH  DAILY IN PLACE OF  ATORVASTATIN, Disp: 90 tablet, Rfl: 3   RYBELSUS 14 MG TABS, TAKE 1 TABLET BY MOUTH DAILY, Disp: 90 tablet, Rfl: 0   terbinafine (LAMISIL) 250 MG tablet, Take  1 tablet (250 mg total) by mouth daily., Disp: 14 tablet, Rfl: 0   TOUJEO MAX SOLOSTAR 300 UNIT/ML Solostar Pen, INJECT SUBCUTANEOUSLY 50 UNITS  INTO THE SKIN DAILY AT 12 NOON, Disp: 12 mL, Rfl: 4   triamcinolone ointment (KENALOG) 0.5 %, Apply 1 application topically 2 (two) times daily., Disp: 60 g, Rfl: 0   valACYclovir (VALTREX) 1000 MG tablet, Take 1 tablet (1,000 mg total) by mouth 2 (two) times daily. Prn, Disp: 60 tablet, Rfl: 0   ARIPiprazole (ABILIFY) 2 MG tablet, Take 1 tablet (2 mg total) by mouth daily., Disp: 90 tablet, Rfl: 1   metFORMIN (GLUCOPHAGE-XR) 750 MG 24 hr tablet, Take 2 tablets (1,500  mg total) by mouth daily with breakfast., Disp: 180 tablet, Rfl: 1   montelukast (SINGULAIR) 10 MG tablet, Take 1 tablet (10 mg total) by mouth at bedtime., Disp: 90 tablet, Rfl: 1   pantoprazole (PROTONIX) 40 MG tablet, Take 1 tablet (40 mg total) by mouth daily., Disp: 90 tablet, Rfl: 3   pregabalin (LYRICA) 75 MG capsule, Take 1 capsule (75 mg total) by mouth 3 (three) times daily., Disp: 270 capsule, Rfl: 1   sertraline (ZOLOFT) 100 MG tablet, Take 1 tablet (100 mg total) by mouth daily., Disp: 90 tablet, Rfl: 1   temazepam (RESTORIL) 30 MG capsule, Take 1 capsule (30 mg total) by mouth daily., Disp: 90 capsule, Rfl: 0   valsartan (DIOVAN) 80 MG tablet, Take 1 tablet (80 mg total) by mouth daily., Disp: 90 tablet, Rfl: 3  Allergies  Allergen Reactions   Ace Inhibitors     cough   Bee Venom     Bee   Dapagliflozin     hematuria   Ozempic (0.25 Or 0.5 Mg-Dose) [Semaglutide(0.25 Or 0.5mg -Dos)]     Bloating     I personally reviewed active problem list, medication list, allergies, family history, social history, health maintenance with the patient/caregiver today.   ROS  Constitutional: Negative for fever or weight change.  Respiratory: Negative for cough and shortness of breath.   Cardiovascular: Negative for chest pain or palpitations.  Gastrointestinal: Negative for  abdominal pain, no bowel changes.  Musculoskeletal: Negative for gait problem or joint swelling.  Skin: Negative for rash.  Neurological: Negative for dizziness or headache.  No other specific complaints in a complete review of systems (except as listed in HPI above).   Objective  Vitals:   03/29/22 0803  BP: 126/72  Pulse: 94  Resp: 16  SpO2: 98%  Weight: 214 lb (97.1 kg)  Height: 5\' 6"  (1.676 m)    Body mass index is 34.54 kg/m.  Physical Exam  Constitutional: Patient appears well-developed and well-nourished. Obese  No distress.  HEENT: head atraumatic, normocephalic, pupils equal and reactive to light, neck supple Cardiovascular: Normal rate, regular rhythm and normal heart sounds.  No murmur heard. No BLE edema. Pulmonary/Chest: Effort normal and breath sounds normal. No respiratory distress. Abdominal: Soft.  There is no tenderness. Psychiatric: Patient has a normal mood and affect. behavior is normal. Judgment and thought content normal.   Recent Results (from the past 2160 hour(s))  POCT HgB A1C     Status: Abnormal   Collection Time: 03/29/22  8:16 AM  Result Value Ref Range   Hemoglobin A1C 8.5 (A) 4.0 - 5.6 %   HbA1c POC (<> result, manual entry)     HbA1c, POC (prediabetic range)     HbA1c, POC (controlled diabetic range)      Diabetic Foot Exam: Diabetic Foot Exam - Simple   Simple Foot Form Visual Inspection See comments: Yes Sensation Testing Intact to touch and monofilament testing bilaterally: Yes Pulse Check Posterior Tibialis and Dorsalis pulse intact bilaterally: Yes Comments Tinea pedis , rash with pilling on inner aspect of both feet       PHQ2/9:    03/29/2022    8:15 AM 12/26/2021    8:23 AM 10/09/2021    2:03 PM 09/10/2021   11:42 AM 06/15/2021   10:28 AM  Depression screen PHQ 2/9  Decreased Interest 1 2 0 0 1  Down, Depressed, Hopeless 0 1 0 1 1  PHQ - 2 Score 1 3 0 1 2  Altered sleeping 0 0 0 1 0  Tired, decreased energy 0 1 0  1 0  Change in appetite 1 0 0 0 0  Feeling bad or failure about yourself  0 0 0 0 0  Trouble concentrating 0 0 0 1 0  Moving slowly or fidgety/restless 0 0 0 0 0  Suicidal thoughts 0 0 0 0 0  PHQ-9 Score 2 4 0 4 2  Difficult doing work/chores  Not difficult at all   Not difficult at all    phq 9 is positive   Fall Risk:    03/29/2022    8:15 AM 12/26/2021    8:22 AM 10/09/2021    1:59 PM 09/10/2021   11:42 AM 06/15/2021   10:28 AM  Fall Risk   Falls in the past year? 0 0 0 0 0  Number falls in past yr: 0 0 0 0 0  Injury with Fall? 0 0 0 0 0  Risk for fall due to : No Fall Risks  No Fall Risks No Fall Risks No Fall Risks  Follow up Falls prevention discussed Falls evaluation completed Falls prevention discussed Falls prevention discussed Falls prevention discussed      Functional Status Survey: Is the patient deaf or have difficulty hearing?: No Does the patient have difficulty seeing, even when wearing glasses/contacts?: Yes Does the patient have difficulty concentrating, remembering, or making decisions?: No Does the patient have difficulty walking or climbing stairs?: Yes Does the patient have difficulty dressing or bathing?: No Does the patient have difficulty doing errands alone such as visiting a doctor's office or shopping?: No    Assessment & Plan  1. Dyslipidemia associated with type 2 diabetes mellitus (HCC)  - POCT HgB A1C - [facility] Diabetes Foot Exam - Lipid panel - Microalbumin / creatinine urine ratio  2. Mild recurrent major depression (HCC)  - ARIPiprazole (ABILIFY) 2 MG tablet; Take 1 tablet (2 mg total) by mouth daily.  Dispense: 90 tablet; Refill: 1  3. Hypertension associated with diabetes (HCC)  - valsartan (DIOVAN) 80 MG tablet; Take 1 tablet (80 mg total) by mouth daily.  Dispense: 90 tablet; Refill: 3 - Comprehensive metabolic panel - CBC with Differential/Platelet  4. Menopausal symptom  - sertraline (ZOLOFT) 100 MG tablet; Take 1 tablet (100  mg total) by mouth daily.  Dispense: 90 tablet; Refill: 1  5. Need for immunization against influenza  - Flu Vaccine QUAD 6+ mos PF IM (Fluarix Quad PF)  6. Diabetic polyneuropathy associated with type 2 diabetes mellitus (HCC)  - metFORMIN (GLUCOPHAGE-XR) 750 MG 24 hr tablet; Take 2 tablets (1,500 mg total) by mouth daily with breakfast.  Dispense: 180 tablet; Refill: 1  7. Insomnia, persistent  - temazepam (RESTORIL) 30 MG capsule; Take 1 capsule (30 mg total) by mouth daily.  Dispense: 90 capsule; Refill: 0  8. Lumbar back pain with radiculopathy affecting right lower extremity  - pregabalin (LYRICA) 75 MG capsule; Take 1 capsule (75 mg total) by mouth 3 (three) times daily.  Dispense: 270 capsule; Refill: 1  9. Gastroesophageal reflux disease without esophagitis  - pantoprazole (PROTONIX) 40 MG tablet; Take 1 tablet (40 mg total) by mouth daily.  Dispense: 90 tablet; Refill: 3  10. Seasonal allergic rhinitis, unspecified trigger  - montelukast (SINGULAIR) 10 MG tablet; Take 1 tablet (10 mg total) by mouth at bedtime.  Dispense: 90 tablet; Refill: 1  11. Benign hypertension  - valsartan (DIOVAN) 80 MG tablet; Take 1 tablet (80 mg total)  by mouth daily.  Dispense: 90 tablet; Refill: 3  12. B12 deficiency  - Vitamin B12  13. Vitamin D deficiency  - VITAMIN D 25 Hydroxy (Vit-D Deficiency, Fractures)  14. OSA on CPAP

## 2022-03-29 ENCOUNTER — Ambulatory Visit: Payer: Managed Care, Other (non HMO) | Admitting: Family Medicine

## 2022-03-29 ENCOUNTER — Encounter: Payer: Self-pay | Admitting: Family Medicine

## 2022-03-29 VITALS — BP 126/72 | HR 94 | Resp 16 | Ht 66.0 in | Wt 214.0 lb

## 2022-03-29 DIAGNOSIS — N951 Menopausal and female climacteric states: Secondary | ICD-10-CM

## 2022-03-29 DIAGNOSIS — E1159 Type 2 diabetes mellitus with other circulatory complications: Secondary | ICD-10-CM | POA: Diagnosis not present

## 2022-03-29 DIAGNOSIS — I152 Hypertension secondary to endocrine disorders: Secondary | ICD-10-CM

## 2022-03-29 DIAGNOSIS — M5416 Radiculopathy, lumbar region: Secondary | ICD-10-CM

## 2022-03-29 DIAGNOSIS — G4733 Obstructive sleep apnea (adult) (pediatric): Secondary | ICD-10-CM

## 2022-03-29 DIAGNOSIS — Z23 Encounter for immunization: Secondary | ICD-10-CM | POA: Diagnosis not present

## 2022-03-29 DIAGNOSIS — E538 Deficiency of other specified B group vitamins: Secondary | ICD-10-CM

## 2022-03-29 DIAGNOSIS — E559 Vitamin D deficiency, unspecified: Secondary | ICD-10-CM

## 2022-03-29 DIAGNOSIS — E785 Hyperlipidemia, unspecified: Secondary | ICD-10-CM

## 2022-03-29 DIAGNOSIS — E1142 Type 2 diabetes mellitus with diabetic polyneuropathy: Secondary | ICD-10-CM

## 2022-03-29 DIAGNOSIS — J302 Other seasonal allergic rhinitis: Secondary | ICD-10-CM

## 2022-03-29 DIAGNOSIS — G4700 Insomnia, unspecified: Secondary | ICD-10-CM

## 2022-03-29 DIAGNOSIS — Z9989 Dependence on other enabling machines and devices: Secondary | ICD-10-CM

## 2022-03-29 DIAGNOSIS — B353 Tinea pedis: Secondary | ICD-10-CM

## 2022-03-29 DIAGNOSIS — K219 Gastro-esophageal reflux disease without esophagitis: Secondary | ICD-10-CM

## 2022-03-29 DIAGNOSIS — E1169 Type 2 diabetes mellitus with other specified complication: Secondary | ICD-10-CM | POA: Diagnosis not present

## 2022-03-29 DIAGNOSIS — F330 Major depressive disorder, recurrent, mild: Secondary | ICD-10-CM | POA: Diagnosis not present

## 2022-03-29 DIAGNOSIS — I10 Essential (primary) hypertension: Secondary | ICD-10-CM

## 2022-03-29 LAB — POCT GLYCOSYLATED HEMOGLOBIN (HGB A1C): Hemoglobin A1C: 8.5 % — AB (ref 4.0–5.6)

## 2022-03-29 MED ORDER — PREGABALIN 75 MG PO CAPS: 75.0000 mg | ORAL_CAPSULE | Freq: Three times a day (TID) | ORAL | 1 refills | Status: DC

## 2022-03-29 MED ORDER — TEMAZEPAM 30 MG PO CAPS: 30.0000 mg | ORAL_CAPSULE | Freq: Every day | ORAL | 0 refills | Status: DC

## 2022-03-29 MED ORDER — VALSARTAN 80 MG PO TABS: 80.0000 mg | ORAL_TABLET | Freq: Every day | ORAL | 3 refills | Status: DC

## 2022-03-29 MED ORDER — METFORMIN HCL ER 750 MG PO TB24: 1500.0000 mg | ORAL_TABLET | Freq: Every day | ORAL | 1 refills | Status: DC

## 2022-03-29 MED ORDER — MONTELUKAST SODIUM 10 MG PO TABS: 10.0000 mg | ORAL_TABLET | Freq: Every day | ORAL | 1 refills | Status: DC

## 2022-03-29 MED ORDER — TERBINAFINE HCL 250 MG PO TABS: 250.0000 mg | ORAL_TABLET | Freq: Every day | ORAL | 0 refills | Status: DC

## 2022-03-29 MED ORDER — PIOGLITAZONE HCL 15 MG PO TABS: 15.0000 mg | ORAL_TABLET | Freq: Every day | ORAL | 1 refills | Status: DC

## 2022-03-29 MED ORDER — PANTOPRAZOLE SODIUM 40 MG PO TBEC: 40.0000 mg | DELAYED_RELEASE_TABLET | Freq: Every day | ORAL | 3 refills | Status: DC

## 2022-03-29 MED ORDER — SERTRALINE HCL 100 MG PO TABS: 100.0000 mg | ORAL_TABLET | Freq: Every day | ORAL | 1 refills | Status: DC

## 2022-03-29 MED ORDER — ARIPIPRAZOLE 2 MG PO TABS
2.0000 mg | ORAL_TABLET | Freq: Every day | ORAL | 1 refills | Status: DC
Start: 2022-03-29 — End: 2022-10-11

## 2022-03-31 LAB — LIPID PANEL
Chol/HDL Ratio: 2.6 ratio (ref 0.0–4.4)
Cholesterol, Total: 113 mg/dL (ref 100–199)
HDL: 44 mg/dL (ref >39)
LDL Chol Calc (NIH): 49 mg/dL (ref 0–99)
Triglycerides: 109 mg/dL (ref 0–149)
VLDL Cholesterol Cal: 20 mg/dL (ref 5–40)

## 2022-03-31 LAB — COMPREHENSIVE METABOLIC PANEL
ALT: 28 IU/L (ref 0–32)
AST: 27 IU/L (ref 0–40)
Albumin/Globulin Ratio: 1.7 (ref 1.2–2.2)
Albumin: 4.2 g/dL (ref 3.8–4.9)
Alkaline Phosphatase: 136 IU/L — ABNORMAL HIGH (ref 44–121)
BUN/Creatinine Ratio: 11 — ABNORMAL LOW (ref 12–28)
BUN: 11 mg/dL (ref 8–27)
Bilirubin Total: <0.2 mg/dL (ref 0.0–1.2)
CO2: 26 mmol/L (ref 20–29)
Calcium: 9.5 mg/dL (ref 8.7–10.3)
Chloride: 101 mmol/L (ref 96–106)
Creatinine, Ser: 0.98 mg/dL (ref 0.57–1.00)
Globulin, Total: 2.5 g/dL (ref 1.5–4.5)
Glucose: 280 mg/dL — ABNORMAL HIGH (ref 70–99)
Potassium: 4.5 mmol/L (ref 3.5–5.2)
Sodium: 142 mmol/L (ref 134–144)
Total Protein: 6.7 g/dL (ref 6.0–8.5)
eGFR: 66 mL/min/{1.73_m2} (ref >59)

## 2022-03-31 LAB — VITAMIN B12: Vitamin B-12: 348 pg/mL (ref 232–1245)

## 2022-03-31 LAB — CBC WITH DIFFERENTIAL/PLATELET
Basophils Absolute: 0 10*3/uL (ref 0.0–0.2)
Basos: 1 %
EOS (ABSOLUTE): 0.1 10*3/uL (ref 0.0–0.4)
Eos: 2 %
Hematocrit: 37.1 % (ref 34.0–46.6)
Hemoglobin: 11.7 g/dL (ref 11.1–15.9)
Immature Grans (Abs): 0 10*3/uL (ref 0.0–0.1)
Immature Granulocytes: 0 %
Lymphocytes Absolute: 1.8 10*3/uL (ref 0.7–3.1)
Lymphs: 31 %
MCH: 25.5 pg — ABNORMAL LOW (ref 26.6–33.0)
MCHC: 31.5 g/dL (ref 31.5–35.7)
MCV: 81 fL (ref 79–97)
Monocytes Absolute: 0.4 10*3/uL (ref 0.1–0.9)
Monocytes: 7 %
Neutrophils Absolute: 3.4 10*3/uL (ref 1.4–7.0)
Neutrophils: 59 %
Platelets: 279 10*3/uL (ref 150–450)
RBC: 4.58 x10E6/uL (ref 3.77–5.28)
RDW: 14 % (ref 11.7–15.4)
WBC: 5.9 10*3/uL (ref 3.4–10.8)

## 2022-03-31 LAB — MICROALBUMIN / CREATININE URINE RATIO
Creatinine, Urine: 235.2 mg/dL
Microalb/Creat Ratio: 4 mg/g creat (ref 0–29)
Microalbumin, Urine: 8.5 ug/mL

## 2022-03-31 LAB — VITAMIN D 25 HYDROXY (VIT D DEFICIENCY, FRACTURES): Vit D, 25-Hydroxy: 37.3 ng/mL (ref 30.0–100.0)

## 2022-04-10 ENCOUNTER — Ambulatory Visit: Payer: Self-pay | Admitting: *Deleted

## 2022-04-10 ENCOUNTER — Ambulatory Visit (INDEPENDENT_AMBULATORY_CARE_PROVIDER_SITE_OTHER): Payer: Managed Care, Other (non HMO) | Admitting: Nurse Practitioner

## 2022-04-10 ENCOUNTER — Encounter: Payer: Self-pay | Admitting: Nurse Practitioner

## 2022-04-10 VITALS — BP 128/82 | HR 90 | Temp 98.1°F | Resp 18 | Ht 66.0 in | Wt 218.5 lb

## 2022-04-10 DIAGNOSIS — Z794 Long term (current) use of insulin: Secondary | ICD-10-CM | POA: Diagnosis not present

## 2022-04-10 DIAGNOSIS — E1169 Type 2 diabetes mellitus with other specified complication: Secondary | ICD-10-CM | POA: Insufficient documentation

## 2022-04-10 DIAGNOSIS — E1165 Type 2 diabetes mellitus with hyperglycemia: Secondary | ICD-10-CM | POA: Insufficient documentation

## 2022-04-10 DIAGNOSIS — T7840XA Allergy, unspecified, initial encounter: Secondary | ICD-10-CM | POA: Diagnosis not present

## 2022-04-10 MED ORDER — INSULIN ASPART 100 UNIT/ML IJ SOLN: INTRAMUSCULAR | 99 refills | Status: DC

## 2022-04-10 MED ORDER — HUMALOG KWIKPEN 100 UNIT/ML ~~LOC~~ SOPN: PEN_INJECTOR | SUBCUTANEOUS | 11 refills | Status: DC

## 2022-04-10 MED ORDER — DIPHENHYDRAMINE HCL 50 MG/ML IJ SOLN
50.0000 mg | Freq: Once | INTRAMUSCULAR | Status: AC
  Administered 2022-04-10: 50 mg via INTRAMUSCULAR

## 2022-04-10 MED ORDER — INSULIN PEN NEEDLE 32G X 6 MM MISC: 1.0000 | Freq: Three times a day (TID) | 5 refills | Status: DC

## 2022-04-10 MED ORDER — DEXAMETHASONE SODIUM PHOSPHATE 10 MG/ML IJ SOLN
10.0000 mg | Freq: Once | INTRAMUSCULAR | Status: AC
  Administered 2022-04-10: 10 mg via INTRAMUSCULAR

## 2022-04-10 MED ORDER — GVOKE HYPOPEN 1-PACK 1 MG/0.2ML ~~LOC~~ SOAJ: 1.0000 mg | SUBCUTANEOUS | 3 refills | Status: AC | PRN

## 2022-04-10 NOTE — Telephone Encounter (Signed)
  Chief Complaint: rash Symptoms: itching Frequency: one day (started Actos) Pertinent Negatives: Patient denies throat tightness Disposition: [] ED /[] Urgent Care (no appt availability in office) / [x] Appointment(In office/virtual)/ []  [facility]/ [] Home Care/ [] Refused Recommended Disposition /[] [facility]/ []  Follow-up with PCP Additional Notes: Pt is at work, appt for this afternoon, advised to go to ED if throat started feeling tight, home care reviewed.   Reason for Disposition  SEVERE itching (i.e., interferes with sleep, normal activities or school)  Answer Assessment - Initial Assessment Questions 1. APPEARANCE of RASH: "Describe the rash." (e.g., spots, blisters, raised areas, skin peeling, scaly)     Bumps, red rash over a lot of body 2. SIZE: "How big are the spots?" (e.g., tip of pen, eraser, coin; inches, centimeters)     Both splotches waist up and all rash waist down 3. LOCATION: "Where is the rash located?"     See above 4. COLOR: "What color is the rash?" (Note: It is difficult to assess rash color in people with darker-colored skin. When this situation occurs, simply ask the caller to describe what they see.)     red 5. ONSET: "When did the rash begin?"     Starting med Sunday rash started tuesday 6. FEVER: "Do you have a fever?" If Yes, ask: "What is your temperature, how was it measured, and when did it start?"     no 7. ITCHING: "Does the rash itch?" If Yes, ask: "How bad is the itch?" (Scale 1-10; or mild, moderate, severe)     yes 8. CAUSE: "What do you think is causing the rash?"     New med for diabetes 9. MEDICINE FACTORS: "Have you started any new medicines within the last 2 weeks?" (e.g., antibiotics)      Yes see above 10. OTHER SYMPTOMS: "Do you have any other symptoms?" (e.g., dizziness, headache, sore throat, joint pain)       no 11. PREGNANCY: "Is there any chance you are pregnant?" "When was your last menstrual period?"        no  Protocols used: Rash or Redness - [facility] ([facility])

## 2022-04-10 NOTE — Assessment & Plan Note (Signed)
Continue taking metformin 1500 mg daily, toujeo 50 units daily and start humalog. Check fsbs prior to meals  Hold if below 110 If glucose 110- 140 give 4 units If glucose 140 to 180 give 6 units  If glucose 180 to 220  give 8 units If glucose above 220 give 10 units

## 2022-04-10 NOTE — Patient Instructions (Addendum)
Check fsbs prior to meals  Hold if below 110 If glucose 110- 140 give 4 units If glucose 140 to 180 give 6 units  If glucose 180 to 220  give 8 units If glucose above 220 give 10 units    Allergic reaction: Stop Actos Take Pepcid two times a day Take benadryl at night Take zyrtec of Claritin during the day

## 2022-04-10 NOTE — Progress Notes (Signed)
BP 128/82   Pulse 90   Temp 98.1 F (36.7 C)   Resp 18   Ht 5' 6" (1.676 m)   Wt 218 lb 8 oz (99.1 kg)   SpO2 97%   BMI 35.27 kg/m    Subjective:    Patient ID: Joyce Blackburn, female    DOB: 09-Feb-1961, 61 y.o.   MRN: [id number]  HPI: Joyce Blackburn is a 61 y.o. female  Chief Complaint  Patient presents with   Allergic Reaction    Rash from actos   Rash   Allergic reaction: Patient reports she developed a rash after taking actos.  She was started on actos on 04/07/2022.  Due to her A1C still being elevated. Dr. Ancil Boozer had discussed with the patient that if her A1C did not improve at her next appointment she would need to start meal time insulin.  Patient started Actos as planned but then developed a rash. Patient reports she noticed the rash yesterday and it has gotten worse.  She says that it is itchy and all over.  Patient denies any shortness of breath or difficulty swallowing.  No swelling noted to lips or tongue. Discussed this is likely due to starting a new medication. Will treat as allergic reaction. Stop Actos, can take benadryl at night and zyrtec during the day, take pepcid two times a day.  Will give benadryl and steroid injection in office. Discussed with patient that her blood sugar will likely be elevated for the next 24 hours due to the steroid injection.    Patient's not going to be able to take Actos it appears the patient has an allergy to it.  We will go ahead with secondary plan of Dr. Ancil Boozer and start mealtime insulin.  Patient is also on metformin 1500 mg daily, and Toujeo 50 units daily.  Diabetes: Patient is currently taking metformin 1500 mg daily and Toujeo 50 units daily.  Patient recently started Actos but developed an allergic reaction to it.  We will stop the Actos and start meal time insulin.  Her last A1c was 0.5 on 03/29/2022.  She reports her blood sugars have been running 150-165. Pre-meal insulin :  Check fsbs prior to meals  Hold if below 110 If  glucose 110- 140 give 4 units If glucose 140 to 180 give 6 units  If glucose 180 to 220  give 8 units If glucose above 220 give 10 units   Relevant past medical, surgical, family and social history reviewed and updated as indicated. Interim medical history since our last visit reviewed. Allergies and medications reviewed and updated.  Review of Systems  Constitutional: Negative for fever or weight change.  Respiratory: Negative for cough and shortness of breath.   Cardiovascular: Negative for chest pain or palpitations.  Gastrointestinal: Negative for abdominal pain, no bowel changes.  Musculoskeletal: Negative for gait problem or joint swelling.  Skin: positive for rash.  Neurological: Negative for dizziness or headache.  No other specific complaints in a complete review of systems (except as listed in HPI above).      Objective:    BP 128/82   Pulse 90   Temp 98.1 F (36.7 C)   Resp 18   Ht 5' 6" (1.676 m)   Wt 218 lb 8 oz (99.1 kg)   SpO2 97%   BMI 35.27 kg/m   Wt Readings from Last 3 Encounters:  04/10/22 218 lb 8 oz (99.1 kg)  03/29/22 214 lb (97.1 kg)  12/26/21  213 lb 4.8 oz (96.8 kg)    Physical Exam  Constitutional: Patient appears well-developed and well-nourished. Obese  No distress.  HEENT: head atraumatic, normocephalic, pupils equal and reactive to light,  neck supple, throat within normal limits Cardiovascular: Normal rate, regular rhythm and normal heart sounds.  No murmur heard. No BLE edema. Pulmonary/Chest: Effort normal and breath sounds normal. No respiratory distress. Abdominal: Soft.  There is no tenderness. Skin: hives noted over abdomen, back , thighs and neck.  Psychiatric: Patient has a normal mood and affect. behavior is normal. Judgment and thought content normal.  Results for orders placed or performed in visit on 03/29/22  Lipid panel  Result Value Ref Range   Cholesterol, Total 113 100 - 199 mg/dL   Triglycerides 109 0 - 149 mg/dL    HDL 44 >39 mg/dL   VLDL Cholesterol Cal 20 5 - 40 mg/dL   LDL Chol Calc (NIH) 49 0 - 99 mg/dL   Chol/HDL Ratio 2.6 0.0 - 4.4 ratio  Microalbumin / creatinine urine ratio  Result Value Ref Range   Creatinine, Urine 235.2 Not Estab. mg/dL   Microalbumin, Urine 8.5 Not Estab. ug/mL   Microalb/Creat Ratio 4 0 - 29 mg/g creat  Comprehensive metabolic panel  Result Value Ref Range   Glucose 280 (H) 70 - 99 mg/dL   BUN 11 8 - 27 mg/dL   Creatinine, Ser 0.98 0.57 - 1.00 mg/dL   eGFR 66 >59 mL/min/1.73   BUN/Creatinine Ratio 11 (L) 12 - 28   Sodium 142 134 - 144 mmol/L   Potassium 4.5 3.5 - 5.2 mmol/L   Chloride 101 96 - 106 mmol/L   CO2 26 20 - 29 mmol/L   Calcium 9.5 8.7 - 10.3 mg/dL   Total Protein 6.7 6.0 - 8.5 g/dL   Albumin 4.2 3.8 - 4.9 g/dL   Globulin, Total 2.5 1.5 - 4.5 g/dL   Albumin/Globulin Ratio 1.7 1.2 - 2.2   Bilirubin Total <0.2 0.0 - 1.2 mg/dL   Alkaline Phosphatase 136 (H) 44 - 121 IU/L   AST 27 0 - 40 IU/L   ALT 28 0 - 32 IU/L  CBC with Differential/Platelet  Result Value Ref Range   WBC 5.9 3.4 - 10.8 x10E3/uL   RBC 4.58 3.77 - 5.28 x10E6/uL   Hemoglobin 11.7 11.1 - 15.9 g/dL   Hematocrit 37.1 34.0 - 46.6 %   MCV 81 79 - 97 fL   MCH 25.5 (L) 26.6 - 33.0 pg   MCHC 31.5 31.5 - 35.7 g/dL   RDW 14.0 11.7 - 15.4 %   Platelets 279 150 - 450 x10E3/uL   Neutrophils 59 Not Estab. %   Lymphs 31 Not Estab. %   Monocytes 7 Not Estab. %   Eos 2 Not Estab. %   Basos 1 Not Estab. %   Neutrophils Absolute 3.4 1.4 - 7.0 x10E3/uL   Lymphocytes Absolute 1.8 0.7 - 3.1 x10E3/uL   Monocytes Absolute 0.4 0.1 - 0.9 x10E3/uL   EOS (ABSOLUTE) 0.1 0.0 - 0.4 x10E3/uL   Basophils Absolute 0.0 0.0 - 0.2 x10E3/uL   Immature Granulocytes 0 Not Estab. %   Immature Grans (Abs) 0.0 0.0 - 0.1 x10E3/uL  VITAMIN D 25 Hydroxy (Vit-D Deficiency, Fractures)  Result Value Ref Range   Vit D, 25-Hydroxy 37.3 30.0 - 100.0 ng/mL  Vitamin B12  Result Value Ref Range   Vitamin B-12 348 232 -  1,245 pg/mL  POCT HgB A1C  Result Value Ref Range  Hemoglobin A1C 8.5 (A) 4.0 - 5.6 %   HbA1c POC (<> result, manual entry)     HbA1c, POC (prediabetic range)     HbA1c, POC (controlled diabetic range)        Assessment & Plan:   Problem List Items Addressed This Visit       Endocrine   Controlled type 2 diabetes mellitus with hyperglycemia, with long-term current use of insulin (HCC) - Primary    Continue taking metformin 1500 mg daily, toujeo 50 units daily and start humalog. Check fsbs prior to meals  Hold if below 110 If glucose 110- 140 give 4 units If glucose 140 to 180 give 6 units  If glucose 180 to 220  give 8 units If glucose above 220 give 10 units       Relevant Medications   Glucagon (GVOKE HYPOPEN 1-PACK) 1 MG/0.2ML SOAJ   insulin lispro (HUMALOG KWIKPEN) 100 UNIT/ML KwikPen   Insulin Pen Needle 32G X 6 MM MISC   Other Visit Diagnoses     Allergic reaction, initial encounter       Stop actos, take benadryl at night, zyrtec during the day and pepcid two times a day.  Gave injection of benadryl and decadron.     Relevant Medications   diphenhydrAMINE (BENADRYL) injection 50 mg (Completed)   dexamethasone (DECADRON) injection 10 mg (Completed)        Follow up plan: Return for Has follow-up appointment with Dr. Ancil Boozer.Marland Kitchen

## 2022-04-12 ENCOUNTER — Encounter: Payer: Self-pay | Admitting: Nurse Practitioner

## 2022-04-12 ENCOUNTER — Other Ambulatory Visit: Payer: Self-pay | Admitting: Nurse Practitioner

## 2022-04-12 DIAGNOSIS — T7840XA Allergy, unspecified, initial encounter: Secondary | ICD-10-CM

## 2022-04-12 MED ORDER — PREDNISONE 10 MG (21) PO TBPK: ORAL_TABLET | ORAL | 0 refills | Status: DC

## 2022-04-26 ENCOUNTER — Other Ambulatory Visit: Payer: Self-pay | Admitting: Family Medicine

## 2022-04-26 DIAGNOSIS — Z1231 Encounter for screening mammogram for malignant neoplasm of breast: Secondary | ICD-10-CM

## 2022-05-14 ENCOUNTER — Other Ambulatory Visit: Payer: Self-pay | Admitting: Family Medicine

## 2022-05-14 ENCOUNTER — Encounter: Payer: Self-pay | Admitting: Family Medicine

## 2022-05-14 DIAGNOSIS — E1165 Type 2 diabetes mellitus with hyperglycemia: Secondary | ICD-10-CM

## 2022-05-14 MED ORDER — INSULIN LISPRO (1 UNIT DIAL) 100 UNIT/ML (KWIKPEN): PEN_INJECTOR | SUBCUTANEOUS | 1 refills | Status: DC

## 2022-06-17 ENCOUNTER — Ambulatory Visit
Admission: RE | Admit: 2022-06-17 | Discharge: 2022-06-17 | Disposition: A | Discharge status: Home / Self Care | Payer: Managed Care, Other (non HMO) | Source: Ambulatory Visit | Attending: Family Medicine | Admitting: Family Medicine

## 2022-06-17 DIAGNOSIS — Z1231 Encounter for screening mammogram for malignant neoplasm of breast: Secondary | ICD-10-CM

## 2022-06-27 NOTE — Progress Notes (Signed)
Name: Joyce Blackburn   MRN: [id number]    DOB: 03/25/1961   Date:06/28/2022       Progress Note  Subjective  Chief Complaint  Follow Up  HPI  DMII: She states glucose fasting has been between 100-160's Post-prandially 190-250's  A1C still up at 10.4 % done at work 03/08/2021  She is on  Metformin and Toujeo 50 units and A1C went down from 10.4 % to 8.4 % and today is 11.5 %  , off SGL-2 agonist due to recurrent yeast infection, she was taking Rybelsus but last visit she said she stopped taking it because of bad taste in her mouth, today she thinks she is taking it again ( she will check at home ) She is taking ARB and statin therapy , she states neuropathy is better with Lyrica She has dyslipidemia and obesity. I gave her Actos last visit but she developed a rash and had to stop taking it . She denies polyphagia, but has noticed polyuria.   GERD: she also has a history of gastric ulcer over 20  years ago. She states occasionally still has nausea with certain smells, like grease. She was seen by Dr. Lars PinksWhol and had multiple labs done for evaluation of fatty liver, HIDA was normal. She is on Pantoprazole ( Dexilant no longer covered)  she had stopped Rybelsus but she states she is thinking she is back taking it.    OSA: she stopped wearing CPAP again, explained importance of resuming it    Dyslipidemia: she is current taking Rosuvastatin , LDL has been at goal - last level was 49    HTN: bp is at goal, she denies pain, palpitation, SOB  or dizziness . She is compliant with medication    Vitamin D deficiency: she has not been taking supplementation lately   Menopause/major depression mild : she states Zoloft 100 mg  and states hot flashes is still present but night sweats has improved she is doing well, she was feeling more emotional and we added Abilify back in 02/2020. Continue current regiment . Phq 9 today is 2 and stable    Insomnia: she is taking Temazepam and it helps her fall and stay  asleep, no side effects of medications  - denies daytime somnolence.  I will send refill today    Morbid obesity: she has gained 5 more pounds since last visit,  she thinks she stopped Rybelsus, she would like to try weekly injection  BMI is above 35 with co-morbidities such as DM, HTN and dyslipidemia   Migraine headaches:  Episodes start suddenly associated with nausea and vomiting.  states Bernita RaisinUbrelvy works but was making her sleepy. No episodes in months   Sciatica :long history of recurrent lower back pain since 2019 , but since 2022  has intermittent radiculitis . She  took prednisone in July 2022, she is taking Lyrica, no longer on Tylenol # 3, pain is intermittent, usually stiff in am's but improves as she moves more   Patient Active Problem List   Diagnosis Date Noted   Controlled type 2 diabetes mellitus with hyperglycemia, with long-term current use of insulin (HCC) 04/10/2022   Mild recurrent major depression (HCC) 12/26/2021   Lumbar back pain with radiculopathy affecting right lower extremity 12/26/2021   Sciatic nerve pain, right 12/26/2021   Major depression in remission (HCC) 12/19/2017   Fatty liver 03/04/2016   Vitamin D deficiency 03/04/2016   Gastroesophageal reflux disease without esophagitis 07/04/2015   Diabetic polyneuropathy associated  with type 2 diabetes mellitus (HCC) 03/03/2015   Peripheral neuropathy 03/03/2015   Allergic to bees 02/20/2015   Benign hypertension 02/20/2015   Insomnia, persistent 02/20/2015   Chronic idiopathic constipation 02/20/2015   Dyslipidemia 02/20/2015   Dermatitis, eczematoid 02/20/2015   H/O gastric ulcer 02/20/2015   Herpes 02/20/2015   Migraine without aura and without status migrainosus, not intractable 02/20/2015   Morbid obesity (HCC) 02/20/2015   OSA on CPAP 02/20/2015   Paresthesia of arm 02/20/2015   Periodic limb movement 02/20/2015   Allergic rhinitis, seasonal 02/20/2015   Menopausal symptom 02/20/2015    Past  Surgical History:  Procedure Laterality Date   ABDOMINAL HYSTERECTOMY     COLONOSCOPY WITH PROPOFOL N/A 05/26/2020   Procedure: COLONOSCOPY WITH PROPOFOL;  Surgeon: Wyline Mood, MD;  Location: [facility] ENDOSCOPY;  Service: Gastroenterology;  Laterality: N/A;   RETINAL TEAR REPAIR CRYOTHERAPY Right    [facility] (Dr. Robin Searing)    Family History  Problem Relation Age of Onset   Mental illness Mother    Diabetes Mother    CVA Mother    Schizophrenia Mother    Diabetes Sister    Hypertension Sister    Diabetes Brother    Mental illness Brother    Bipolar disorder Brother    Schizophrenia Brother     Social History   Tobacco Use   Smoking status: Former    Packs/day: 0.10    Years: 15.00    Total pack years: 1.50    Types: Cigarettes    Quit date: 07/29/2004    Years since quitting: 17.9   Smokeless tobacco: Never  Substance Use Topics   Alcohol use: No    Alcohol/week: 0.0 standard drinks of alcohol     Current Outpatient Medications:    Alcohol Swabs (EASY TOUCH ALCOHOL PREP MEDIUM) 70 % PADS, 1 each by Does not apply route 3 (three) times daily as needed., Disp: 200 each, Rfl: 3   ARIPiprazole (ABILIFY) 2 MG tablet, Take 1 tablet (2 mg total) by mouth daily., Disp: 90 tablet, Rfl: 1   aspirin 81 MG tablet, Take 81 mg by mouth daily., Disp: , Rfl:    Cholecalciferol (VITAMIN D3) 1.25 MG (50000 UT) CAPS, TAKE 1 CAPSULE BY MOUTH  EVERY 7 DAYS, Disp: 12 capsule, Rfl: 0   EPINEPHRINE, ANAPHYLAXIS THERAPY AGENTS,, EPIPEN 2-PAK, 0.3MG /0.3ML (Injection Device)  1 Device use as directed for 30 days  Quantity: 2;  Refills: 1   Ordered :06-Mar-2012  Alba Cory MD;  Started 06-Mar-2012 Active Comments: DX: 989.5, Disp: , Rfl:    Glucagon (GVOKE HYPOPEN 1-PACK) 1 MG/0.2ML SOAJ, Inject 1 mg into the skin as needed (for hypoglycemic episode)., Disp: 0.2 mL, Rfl: 3   insulin lispro (HUMALOG KWIKPEN) 100 UNIT/ML KwikPen, Check fsbs prior to meals , Hold if below 110, If glucose 110- 140  give 4 units, If glucose 140 to 180 give 6 units , If glucose 180 to 220  give 8 units, If glucose above 220 give 10 units, Disp: 45 mL, Rfl: 1   Insulin Pen Needle 32G X 6 MM MISC, 1 each by Does not apply route 3 (three) times daily., Disp: 90 each, Rfl: 5   loratadine (CLARITIN) 10 MG tablet, Take 1 tablet (10 mg total) by mouth daily., Disp: 30 tablet, Rfl: 11   metFORMIN (GLUCOPHAGE-XR) 750 MG 24 hr tablet, Take 2 tablets (1,500 mg total) by mouth daily with breakfast., Disp: 180 tablet, Rfl: 1   montelukast (SINGULAIR) 10 MG tablet,  Take 1 tablet (10 mg total) by mouth at bedtime., Disp: 90 tablet, Rfl: 1   pantoprazole (PROTONIX) 40 MG tablet, Take 1 tablet (40 mg total) by mouth daily., Disp: 90 tablet, Rfl: 3   pregabalin (LYRICA) 75 MG capsule, Take 1 capsule (75 mg total) by mouth 3 (three) times daily., Disp: 270 capsule, Rfl: 1   rosuvastatin (CRESTOR) 40 MG tablet, TAKE 1 TABLET BY MOUTH  DAILY IN PLACE OF  ATORVASTATIN, Disp: 90 tablet, Rfl: 3   RYBELSUS 14 MG TABS, TAKE 1 TABLET BY MOUTH DAILY, Disp: 90 tablet, Rfl: 0   sertraline (ZOLOFT) 100 MG tablet, Take 1 tablet (100 mg total) by mouth daily., Disp: 90 tablet, Rfl: 1   temazepam (RESTORIL) 30 MG capsule, Take 1 capsule (30 mg total) by mouth daily., Disp: 90 capsule, Rfl: 0   TOUJEO MAX SOLOSTAR 300 UNIT/ML Solostar Pen, INJECT SUBCUTANEOUSLY 50 UNITS  INTO THE SKIN DAILY AT 12 NOON, Disp: 12 mL, Rfl: 4   triamcinolone ointment (KENALOG) 0.5 %, Apply 1 application topically 2 (two) times daily., Disp: 60 g, Rfl: 0   valACYclovir (VALTREX) 1000 MG tablet, Take 1 tablet (1,000 mg total) by mouth 2 (two) times daily. Prn, Disp: 60 tablet, Rfl: 0   valsartan (DIOVAN) 80 MG tablet, Take 1 tablet (80 mg total) by mouth daily., Disp: 90 tablet, Rfl: 3  Allergies  Allergen Reactions   Ace Inhibitors     cough   Bee Venom     Bee   Dapagliflozin     hematuria   Ozempic (0.25 Or 0.5 Mg-Dose) [Semaglutide(0.25 Or 0.5mg -Dos)]      Bloating    Actos [Pioglitazone] Rash    I personally reviewed active problem list, medication list, allergies, family history, social history, health maintenance with the patient/caregiver today.   ROS  Constitutional: Negative for fever , positive for weight change.  Respiratory: Negative for cough and shortness of breath.   Cardiovascular: Negative for chest pain or palpitations.  Gastrointestinal: Negative for abdominal pain, no bowel changes.  Musculoskeletal: Negative for gait problem or joint swelling.  Skin: Negative for rash.  Neurological: Negative for dizziness or headache.  No other specific complaints in a complete review of systems (except as listed in HPI above).   Objective  Vitals:   06/28/22 0836  BP: 128/78  Pulse: 95  Resp: 16  Temp: 97.8 F (36.6 C)  TempSrc: Oral  SpO2: 100%  Weight: 224 lb 6.4 oz (101.8 kg)  Height: 5\' 7"  (1.702 m)    Body mass index is 35.15 kg/m.  Physical Exam  Constitutional: Patient appears well-developed and well-nourished. Obese  No distress.  HEENT: head atraumatic, normocephalic, pupils equal and reactive to light, neck supple Cardiovascular: Normal rate, regular rhythm and normal heart sounds.  No murmur heard. No BLE edema. Pulmonary/Chest: Effort normal and breath sounds normal. No respiratory distress. Abdominal: Soft.  There is no tenderness. Psychiatric: Patient has a normal mood and affect. behavior is normal. Judgment and thought content normal.   Recent Results (from the past 2160 hour(s))  POCT HgB A1C     Status: Abnormal   Collection Time: 06/28/22  8:41 AM  Result Value Ref Range   Hemoglobin A1C 11.5 (A) 4.0 - 5.6 %   HbA1c POC (<> result, manual entry)     HbA1c, POC (prediabetic range)     HbA1c, POC (controlled diabetic range)       PHQ2/9:    06/28/2022    8:41 AM  04/10/2022    3:11 PM 03/29/2022    8:15 AM 12/26/2021    8:23 AM 10/09/2021    2:03 PM  Depression screen PHQ 2/9  Decreased  Interest 1 0 1 2 0  Down, Depressed, Hopeless 1 0 0 1 0  PHQ - 2 Score 2 0 1 3 0  Altered sleeping 0 0 0 0 0  Tired, decreased energy 0 0 0 1 0  Change in appetite 0 0 1 0 0  Feeling bad or failure about yourself  0 0 0 0 0  Trouble concentrating 0 0 0 0 0  Moving slowly or fidgety/restless 0 0 0 0 0  Suicidal thoughts 0 0 0 0 0  PHQ-9 Score 2 0 2 4 0  Difficult doing work/chores Not difficult at all Not difficult at all  Not difficult at all     phq 9 is positive   Fall Risk:    06/28/2022    8:38 AM 04/10/2022    3:10 PM 03/29/2022    8:15 AM 12/26/2021    8:22 AM 10/09/2021    1:59 PM  Fall Risk   Falls in the past year? 0 0 0 0 0  Number falls in past yr:  0 0 0 0  Injury with Fall?  0 0 0 0  Risk for fall due to : No Fall Risks  No Fall Risks  No Fall Risks  Follow up Falls prevention discussed;Education provided;Falls evaluation completed  Falls prevention discussed Falls evaluation completed Falls prevention discussed     Functional Status Survey: Is the patient deaf or have difficulty hearing?: No Does the patient have difficulty seeing, even when wearing glasses/contacts?: Yes Does the patient have difficulty concentrating, remembering, or making decisions?: No Does the patient have difficulty walking or climbing stairs?: Yes Does the patient have difficulty dressing or bathing?: No Does the patient have difficulty doing errands alone such as visiting a doctor's office or shopping?: No    Assessment & Plan  1. Uncontrolled type 2 diabetes mellitus with hyperglycemia, with long-term current use of insulin (HCC)  - POCT HgB A1C - she thinks she stopped Rybelsus - we will try mounjarno, explained it will also cause indigestion  - Continuous Blood Gluc Sensor (FREESTYLE LIBRE 2 SENSOR) MISC; 1 each by Does not apply route as directed.  Dispense: 6 each; Refill: 1 - Amb ref to Medical Nutrition Therapy-MNT  2. Insomnia, persistent  - temazepam (RESTORIL) 30 MG  capsule; Take 1 capsule (30 mg total) by mouth daily.  Dispense: 90 capsule; Refill: 0  3. OSA on CPAP  She has not been wearing her CPAP   4. Benign hypertension  At goal   5. Mild recurrent major depression (HCC)  Stable on medication zoloft   6. Dyslipidemia associated with type 2 diabetes mellitus (HCC)  On statin therapy   7. Diabetic polyneuropathy associated with type 2 diabetes mellitus (HCC)  On Lyrica  8. Gastroesophageal reflux disease without esophagitis  On PPI and controlled   9. Vitamin D deficiency  Resume supplementation   10. B12 deficiency  She needs to resume supplementation   11. Sciatic nerve pain, right  Stable at this time  12. Morbid obesity (HCC)  Discussed all optiosns for weight loss medications including Belviq, Qsymia, Saxenda and Contrave. Discussed risk and benefits of each of them.

## 2022-06-28 ENCOUNTER — Encounter: Payer: Self-pay | Admitting: Family Medicine

## 2022-06-28 ENCOUNTER — Ambulatory Visit: Payer: Managed Care, Other (non HMO) | Admitting: Family Medicine

## 2022-06-28 VITALS — BP 128/78 | HR 95 | Temp 97.8°F | Resp 16 | Ht 67.0 in | Wt 224.4 lb

## 2022-06-28 DIAGNOSIS — E1165 Type 2 diabetes mellitus with hyperglycemia: Secondary | ICD-10-CM | POA: Diagnosis not present

## 2022-06-28 DIAGNOSIS — Z794 Long term (current) use of insulin: Secondary | ICD-10-CM | POA: Diagnosis not present

## 2022-06-28 DIAGNOSIS — M5431 Sciatica, right side: Secondary | ICD-10-CM

## 2022-06-28 DIAGNOSIS — E1142 Type 2 diabetes mellitus with diabetic polyneuropathy: Secondary | ICD-10-CM

## 2022-06-28 DIAGNOSIS — G4733 Obstructive sleep apnea (adult) (pediatric): Secondary | ICD-10-CM | POA: Diagnosis not present

## 2022-06-28 DIAGNOSIS — G4700 Insomnia, unspecified: Secondary | ICD-10-CM

## 2022-06-28 DIAGNOSIS — F330 Major depressive disorder, recurrent, mild: Secondary | ICD-10-CM

## 2022-06-28 DIAGNOSIS — E559 Vitamin D deficiency, unspecified: Secondary | ICD-10-CM

## 2022-06-28 DIAGNOSIS — E538 Deficiency of other specified B group vitamins: Secondary | ICD-10-CM

## 2022-06-28 DIAGNOSIS — E1169 Type 2 diabetes mellitus with other specified complication: Secondary | ICD-10-CM

## 2022-06-28 DIAGNOSIS — E785 Hyperlipidemia, unspecified: Secondary | ICD-10-CM

## 2022-06-28 DIAGNOSIS — I10 Essential (primary) hypertension: Secondary | ICD-10-CM | POA: Diagnosis not present

## 2022-06-28 DIAGNOSIS — K219 Gastro-esophageal reflux disease without esophagitis: Secondary | ICD-10-CM

## 2022-06-28 LAB — POCT GLYCOSYLATED HEMOGLOBIN (HGB A1C): Hemoglobin A1C: 11.5 % — AB (ref 4.0–5.6)

## 2022-06-28 MED ORDER — TEMAZEPAM 30 MG PO CAPS: 30.0000 mg | ORAL_CAPSULE | Freq: Every day | ORAL | 0 refills | Status: DC

## 2022-06-28 MED ORDER — TIRZEPATIDE 2.5 MG/0.5ML ~~LOC~~ SOAJ: 2.5000 mg | SUBCUTANEOUS | 0 refills | Status: DC

## 2022-06-28 MED ORDER — RYBELSUS 14 MG PO TABS: 1.0000 | ORAL_TABLET | Freq: Every day | ORAL | 1 refills | Status: DC

## 2022-06-28 MED ORDER — FREESTYLE LIBRE 2 SENSOR MISC: 1.0000 | 1 refills | Status: DC

## 2022-07-01 ENCOUNTER — Other Ambulatory Visit: Payer: Self-pay

## 2022-07-01 ENCOUNTER — Encounter: Payer: Self-pay | Admitting: Family Medicine

## 2022-07-01 MED ORDER — DEXCOM G7 RECEIVER DEVI: 1.0000 | Freq: Once | 0 refills | Status: AC

## 2022-07-01 MED ORDER — DEXCOM G7 SENSOR MISC: 1.0000 | 1 refills | Status: DC

## 2022-07-02 ENCOUNTER — Telehealth (INDEPENDENT_AMBULATORY_CARE_PROVIDER_SITE_OTHER): Payer: Managed Care, Other (non HMO) | Admitting: Family Medicine

## 2022-07-02 ENCOUNTER — Encounter: Payer: Self-pay | Admitting: Family Medicine

## 2022-07-02 DIAGNOSIS — Z20828 Contact with and (suspected) exposure to other viral communicable diseases: Secondary | ICD-10-CM | POA: Diagnosis not present

## 2022-07-02 DIAGNOSIS — Z794 Long term (current) use of insulin: Secondary | ICD-10-CM

## 2022-07-02 DIAGNOSIS — E1165 Type 2 diabetes mellitus with hyperglycemia: Secondary | ICD-10-CM

## 2022-07-02 DIAGNOSIS — R059 Cough, unspecified: Secondary | ICD-10-CM

## 2022-07-02 DIAGNOSIS — R6889 Other general symptoms and signs: Secondary | ICD-10-CM

## 2022-07-02 MED ORDER — ALBUTEROL SULFATE HFA 108 (90 BASE) MCG/ACT IN AERS: 2.0000 | INHALATION_SPRAY | Freq: Four times a day (QID) | RESPIRATORY_TRACT | 2 refills | Status: DC | PRN

## 2022-07-02 MED ORDER — OSELTAMIVIR PHOSPHATE 75 MG PO CAPS: 75.0000 mg | ORAL_CAPSULE | Freq: Two times a day (BID) | ORAL | 0 refills | Status: DC

## 2022-07-02 MED ORDER — BENZONATATE 100 MG PO CAPS: 100.0000 mg | ORAL_CAPSULE | Freq: Two times a day (BID) | ORAL | 0 refills | Status: DC | PRN

## 2022-07-02 NOTE — Progress Notes (Signed)
Name: Joyce Blackburn   MRN: [id number]    DOB: 1960-09-24   Date:07/02/2022       Progress Note  Subjective  Chief Complaint  Cough/Chills  I connected with  Orlene Och  on 07/02/22 at 10:20 AM EST by a video enabled telemedicine application and verified that I am speaking with the correct person using two identifiers.  I discussed the limitations of evaluation and management by telemedicine and the availability of in person appointments. The patient expressed understanding and agreed to proceed with the virtual visit  Staff also discussed with the patient that there may be a patient responsible charge related to this service. Patient Location: at home Provider Location: at [facility] Additional Individuals present: alone in her room ( family at home )   HPI  Exposed to flu virus by grandchildren ( they live in the same house) , she states symptoms started two days ago. She has cough that causes some pleuritic chest pain, chills and headaches. No rhinorrhea or nasal congestion. She also has some wheezing and SOB when walking . Home COVID-19 test that was negative.   DMII: she has not been eating and glucose has been controlled, glucose 113-275 , but mostly below 150.   Patient Active Problem List   Diagnosis Date Noted   Controlled type 2 diabetes mellitus with hyperglycemia, with long-term current use of insulin ([place]) 04/10/2022   Mild recurrent major depression ([place]) 12/26/2021   Lumbar back pain with radiculopathy affecting right lower extremity 12/26/2021   Sciatic nerve pain, right 12/26/2021   Major depression in remission ([place]) 12/19/2017   Fatty liver 03/04/2016   Vitamin D deficiency 03/04/2016   Gastroesophageal reflux disease without esophagitis 07/04/2015   Diabetic polyneuropathy associated with type 2 diabetes mellitus ([place]) 03/03/2015   Peripheral neuropathy 03/03/2015   Allergic to bees 02/20/2015   Benign hypertension 02/20/2015   Insomnia, persistent 02/20/2015    Chronic idiopathic constipation 02/20/2015   Dyslipidemia 02/20/2015   Dermatitis, eczematoid 02/20/2015   H/O gastric ulcer 02/20/2015   Herpes 02/20/2015   Migraine without aura and without status migrainosus, not intractable 02/20/2015   Morbid obesity ([place]) 02/20/2015   OSA on CPAP 02/20/2015   Paresthesia of arm 02/20/2015   Periodic limb movement 02/20/2015   Allergic rhinitis, seasonal 02/20/2015   Menopausal symptom 02/20/2015    Past Surgical History:  Procedure Laterality Date   ABDOMINAL HYSTERECTOMY     COLONOSCOPY WITH PROPOFOL N/A 05/26/2020   Procedure: COLONOSCOPY WITH PROPOFOL;  Surgeon: Jonathon Bellows, MD;  Location: [facility] ENDOSCOPY;  Service: Gastroenterology;  Laterality: N/A;   RETINAL TEAR REPAIR CRYOTHERAPY Right    [place] (Dr. 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   Tobacco Use   Smoking status:  Former    Packs/day: 0.10    Years: 15.00    Total pack years: 1.50    Types: Cigarettes    Quit date: 07/29/2004    Years since quitting: 17.9   Smokeless tobacco: Never  Vaping Use   Vaping Use: Never used  Substance and Sexual Activity   Alcohol use: No    Alcohol/week: 0.0 standard drinks of alcohol   Drug use: No   Sexual activity: Not Currently  Other Topics Concern   Not on file  Social History Narrative   She has 3 step-daughters, but just has  contact with the one in [place] one in [place] and the one that was in [place] moved back home to stay with her - but daughter's husband is still in [place] - lives in [place]    She has 3 grand-daughter and one grandson    One Geographical information systems officer and grandson live with her.    Social Determinants of Health   Financial Resource Strain: Low Risk  (03/23/2018)   Overall Financial Resource Strain (CARDIA)    Difficulty of Paying Living Expenses: Not hard at all  Food Insecurity: No Food Insecurity (03/23/2018)   Hunger Vital Sign    Worried About Running Out of Food in the Last Year: Never true    Ran Out of Food in the Last Year: Never true  Transportation Needs: No Transportation Needs (03/23/2018)   PRAPARE - Hydrologist (Medical): No    Lack of Transportation (Non-Medical): No  Physical Activity: Inactive (03/23/2018)   Exercise Vital Sign    Days of Exercise per Week: 0 days    Minutes of Exercise per Session: 0 min  Stress: No Stress Concern Present (03/23/2018)   [place]    Feeling of Stress : Not at all  Social Connections: Moderately Isolated (03/23/2018)   Social Connection and Isolation Panel [NHANES]    Frequency of Communication with Friends and Family: More than three times a week    Frequency of Social Gatherings with Friends and Family: More than three times a week    Attends Religious Services: Never    Marine scientist or Organizations: No    Attends Archivist Meetings: Never    Marital Status: Widowed  Intimate Partner Violence: Not At Risk (03/23/2018)   Humiliation, Afraid, Rape, and Kick questionnaire    Fear of Current or Ex-Partner: No    Emotionally Abused: No    Physically Abused: No    Sexually Abused: No     Current Outpatient Medications:    Alcohol Swabs (EASY TOUCH ALCOHOL PREP MEDIUM) 70 % PADS, 1 each by Does not apply route 3 (three) times daily  as needed., Disp: 200 each, Rfl: 3   ARIPiprazole (ABILIFY) 2 MG tablet, Take 1 tablet (2 mg total) by mouth daily., Disp: 90 tablet, Rfl: 1   aspirin 81 MG tablet, Take 81 mg by mouth daily., Disp: , Rfl:    Cholecalciferol (VITAMIN D3) 1.25 MG (50000 UT) CAPS, TAKE 1 CAPSULE BY MOUTH  EVERY 7 DAYS, Disp: 12 capsule, Rfl: 0   Continuous Blood Gluc Sensor (DEXCOM G7 SENSOR) MISC, 1 each by Does not apply route as directed., Disp: 9 each, Rfl: 1   EPINEPHRINE, ANAPHYLAXIS THERAPY AGENTS,, EPIPEN 2-PAK, 0.3MG /0.3ML (Injection Device)  1 Device use as directed for 30 days  Quantity: 2;  Refills: 1   Ordered :06-Mar-2012  Steele Sizer MD;  Started 06-Mar-2012 Active Comments: DX: 989.5, Disp: , Rfl:    Glucagon (GVOKE HYPOPEN 1-PACK) 1 MG/0.2ML SOAJ, Inject 1 mg into the skin as needed (for hypoglycemic episode)., Disp: 0.2 mL, Rfl: 3   insulin lispro (HUMALOG KWIKPEN) 100 UNIT/ML KwikPen, Check fsbs prior to meals , Hold if below 110, If glucose 110- 140 give 4 units, If glucose 140 to 180 give 6 units , If glucose 180 to 220  give 8 units, If glucose above 220 give 10 units, Disp: 45 mL, Rfl: 1   Insulin Pen Needle 32G X 6 MM MISC, 1 each by Does not apply route 3 (three) times daily., Disp: 90 each, Rfl: 5   loratadine (CLARITIN) 10 MG tablet, Take 1 tablet (10 mg total) by mouth daily., Disp: 30 tablet, Rfl: 11   metFORMIN (GLUCOPHAGE-XR) 750 MG 24 hr tablet, Take 2 tablets (1,500 mg total) by mouth daily with breakfast., Disp: 180 tablet, Rfl: 1   montelukast (SINGULAIR) 10 MG tablet, Take 1 tablet (10 mg total) by mouth at bedtime., Disp: 90 tablet, Rfl: 1   pantoprazole (PROTONIX) 40 MG tablet, Take 1 tablet (40 mg total) by mouth daily., Disp: 90 tablet, Rfl: 3   pregabalin (LYRICA) 75 MG capsule, Take 1 capsule (75 mg total) by mouth 3 (three) times daily., Disp: 270 capsule, Rfl: 1   rosuvastatin (CRESTOR) 40 MG tablet, TAKE 1 TABLET BY MOUTH  DAILY IN PLACE OF  ATORVASTATIN, Disp: 90 tablet,  Rfl: 3   sertraline (ZOLOFT) 100 MG tablet, Take 1 tablet (100 mg total) by mouth daily., Disp: 90 tablet, Rfl: 1   temazepam (RESTORIL) 30 MG capsule, Take 1 capsule (30 mg total) by mouth daily., Disp: 90 capsule, Rfl: 0   tirzepatide (MOUNJARO) 2.5 MG/0.5ML Pen, Inject 2.5 mg into the skin once a week., Disp: 2 mL, Rfl: 0   TOUJEO MAX SOLOSTAR 300 UNIT/ML Solostar Pen, INJECT SUBCUTANEOUSLY 50 UNITS  INTO THE SKIN DAILY AT 12 NOON, Disp: 12 mL, Rfl: 4   triamcinolone ointment (KENALOG) 0.5 %, Apply 1 application topically 2 (two) times daily., Disp: 60 g, Rfl: 0   valACYclovir (VALTREX) 1000 MG tablet, Take 1 tablet (1,000 mg total) by mouth 2 (two) times daily. Prn, Disp: 60 tablet, Rfl: 0   valsartan (DIOVAN) 80 MG tablet, Take 1 tablet (80 mg total) by mouth daily., Disp: 90 tablet, Rfl: 3  Allergies  Allergen Reactions   Ace Inhibitors     cough   Bee Venom     Bee   Dapagliflozin     hematuria   Ozempic (0.25 Or 0.5 Mg-Dose) [Semaglutide(0.25 Or 0.5mg -Dos)]     Bloating    Actos [Pioglitazone] Rash    I personally reviewed active problem list, medication list, allergies, family history, social history, health maintenance with the patient/caregiver today.   ROS  Ten systems reviewed and is negative except as mentioned in HPI   Objective  Virtual encounter, vitals not obtained.  There is no height or weight on file to calculate BMI.  Physical Exam  Awake, alert and oriented, dry cough during the visit   PHQ2/9:    07/02/2022   10:00 AM 06/28/2022    8:41 AM 04/10/2022    3:11 PM 03/29/2022    8:15 AM 12/26/2021    8:23 AM  Depression screen PHQ 2/9  Decreased Interest 0 1 0 1 2  Down, Depressed, Hopeless 0 1 0 0 1  PHQ - 2 Score 0 2 0 1 3  Altered sleeping 0 0 0 0 0  Tired, decreased energy 0 0 0 0 1  Change in appetite 0 0 0 1 0  Feeling bad or failure about yourself  0 0 0 0 0  Trouble concentrating 0 0 0 0 0  Moving slowly or fidgety/restless 0 0 0 0 0   Suicidal thoughts 0 0 0 0 0  PHQ-9 Score 0 2 0 2 4  Difficult doing work/chores  Not difficult at all Not difficult at all  Not difficult at all   PHQ-2/9 Result is negative.    Fall Risk:    07/02/2022   10:00 AM 06/28/2022    8:38 AM 04/10/2022    3:10 PM 03/29/2022    8:15 AM 12/26/2021    8:22 AM  Fall Risk   Falls in the past year? 0 0 0 0 0  Number falls in past yr: 0  0 0 0  Injury with Fall? 0  0 0 0  Risk for fall due to : No Fall Risks No Fall Risks  No Fall Risks   Follow up Falls prevention discussed Falls prevention discussed;Education provided;Falls evaluation completed  Falls prevention discussed Falls evaluation completed     Assessment & Plan  1. Exposure to the flu  - oseltamivir (TAMIFLU) 75 MG capsule; Take 1 capsule (75 mg total) by mouth 2 (two) times daily.  Dispense: 10 capsule; Refill: 0  2. Flu-like symptoms  - oseltamivir (TAMIFLU) 75 MG capsule; Take 1 capsule (75 mg total) by mouth 2 (two) times daily.  Dispense: 10 capsule; Refill: 0 - benzonatate (TESSALON) 100 MG capsule; Take 1-2 capsules (100-200 mg total) by mouth 2 (two) times daily as needed for cough.  Dispense: 40 capsule; Refill: 0 - albuterol (VENTOLIN HFA) 108 (90 Base) MCG/ACT inhaler; Inhale 2 puffs into the lungs every 6 (six) hours as needed for wheezing or shortness of breath.  Dispense: 8 g; Refill: 2  3. Uncontrolled type 2 diabetes mellitus with hyperglycemia, with long-term current use of insulin (HCC)  Monitor glucose and make sure not dropping    I discussed the assessment and treatment plan with the patient. The patient was provided an opportunity to ask questions and all were answered. The patient agreed with the plan and demonstrated an understanding of the instructions.  The patient was advised to call back or seek an in-person evaluation if the symptoms worsen or if the condition fails to improve as anticipated.  I provided 15 minutes of non-face-to-face time during this  encounter.

## 2022-07-20 ENCOUNTER — Encounter: Payer: Self-pay | Admitting: Family Medicine

## 2022-07-23 ENCOUNTER — Other Ambulatory Visit: Payer: Self-pay | Admitting: Family Medicine

## 2022-07-23 MED ORDER — TIRZEPATIDE 5 MG/0.5ML ~~LOC~~ SOAJ: 5.0000 mg | SUBCUTANEOUS | 0 refills | Status: DC

## 2022-08-15 ENCOUNTER — Other Ambulatory Visit: Payer: Self-pay | Admitting: Family Medicine

## 2022-08-15 DIAGNOSIS — J302 Other seasonal allergic rhinitis: Secondary | ICD-10-CM

## 2022-08-15 DIAGNOSIS — E1142 Type 2 diabetes mellitus with diabetic polyneuropathy: Secondary | ICD-10-CM

## 2022-08-15 DIAGNOSIS — N951 Menopausal and female climacteric states: Secondary | ICD-10-CM

## 2022-09-19 ENCOUNTER — Other Ambulatory Visit: Payer: Self-pay | Admitting: Family Medicine

## 2022-09-26 ENCOUNTER — Encounter: Payer: Self-pay | Admitting: Family Medicine

## 2022-10-05 ENCOUNTER — Other Ambulatory Visit: Payer: Self-pay | Admitting: Family Medicine

## 2022-10-05 DIAGNOSIS — G4700 Insomnia, unspecified: Secondary | ICD-10-CM

## 2022-10-05 DIAGNOSIS — E1169 Type 2 diabetes mellitus with other specified complication: Secondary | ICD-10-CM

## 2022-10-10 NOTE — Progress Notes (Signed)
Name: Joyce Blackburn   MRN: [id number]    DOB: 11-10-1960   Date:10/11/2022       Progress Note  Subjective  Chief Complaint  Follow Up  HPI  DMII: A1C is down from 11.5 %  to 6.7%  she had stopped Rybelsus on her own at the time, now she is taking Toujeo 50 units, Metformin 1500 mg  pre meal insulin and Mounjarno 5 mg weekly. She is using her Dexcom 7 and has decreased carb intake and is doing much better. She has associated dyslipidemia, HTN and obesity. Weight is down, LDL is at goal on statin therapy, taking ARB   GERD: she also has a history of gastric ulcer over 20  years ago. She states occasionally still has nausea with certain smells, like grease. She was seen by Dr. Durwin Reges and had multiple labs done for evaluation of fatty liver, HIDA was normal. She is on Pantoprazole ( Dexilant no longer covered)  she had stopped Rybelsus and even though she is on Mounjarno symptoms are controlled    OSA: she stopped wearing CPAP again, explained importance of resuming it , she states she will get a new machine.    Dyslipidemia: she is current taking Rosuvastatin , LDL has been at goal - last level was 49 . Stable    HTN: bp is towards low end of normal, she thinks it is because she has been fasting, did not drink any fluids, states very seldom has orthostatic changes.  She denies pain, palpitation or SOB. She is compliant with medication , she will monitor bp at home and if remains below 120/80 she will take half pill of valsartan    Vitamin D deficiency: she would like to resume rx vitamin D   Menopause/major depression mild : she states Zoloft 100 mg  and states hot flashes is still present but night sweats has improved she is doing well, she was feeling more emotional and we added Abilify back in 02/2020. Continue current regiment . Phq 9 today is 2 and stable    Insomnia: she is taking Temazepam and it helps her fall and stay asleep, no side effects of medications  - denies daytime somnolence.   I will send refill today    Obesity  she is now on Mounjarno and since she started wearing the Dexcom 7 she has been more compliant with a diabetic diet and weight is down 8 lbs in the past 3 months .  BMI is now below 35   Migraine headaches:  Episodes start suddenly associated with nausea and vomiting.  states Roselyn Meier works but was making her sleepy. No longer having episodes  Sciatica :long history of recurrent lower back pain since 2019 , but since 2022  has intermittent radiculitis . She  took prednisone in July 2022, she is taking Lyrica, no longer on Tylenol # 3, she is seeing chiropractor and doing some home exercises but states after sitting for a prolonged period of time, or walking less than a quarter mile she develops right thigh pain like a throbbing sensation, discussed referral to psychiatrist   MRI Dec 2022   IMPRESSION:  1. Retrolisthesis of L3-L4 and L4-L5 with posterior degenerative changes causes mild lateral recess and foraminal stenosis at these levels.  2.  Bulging degenerative disc and retrolisthesis at L5-S1. Mild foraminal encroachment and far lateral displacement of the L5 nerves. Correlate for L5 radiculopathy.  3.  No central stenosis at any level. Upper lumbar spine is benign  B12 deficiency: needs to take SL B12 otc 1000 mcg a few times a week    Patient Active Problem List   Diagnosis Date Noted   Controlled type 2 diabetes mellitus with hyperglycemia, with long-term current use of insulin ([place]) 04/10/2022   Mild recurrent major depression ([place]) 12/26/2021   Lumbar back pain with radiculopathy affecting right lower extremity 12/26/2021   Sciatic nerve pain, right 12/26/2021   Major depression in remission ([place]) 12/19/2017   Fatty liver 03/04/2016   Vitamin D deficiency 03/04/2016   Gastroesophageal reflux disease without esophagitis 07/04/2015   Diabetic polyneuropathy associated with type 2 diabetes mellitus ([place]) 03/03/2015   Peripheral neuropathy  03/03/2015   Allergic to bees 02/20/2015   Benign hypertension 02/20/2015   Insomnia, persistent 02/20/2015   Chronic idiopathic constipation 02/20/2015   Dyslipidemia 02/20/2015   Dermatitis, eczematoid 02/20/2015   H/O gastric ulcer 02/20/2015   Herpes 02/20/2015   Migraine without aura and without status migrainosus, not intractable 02/20/2015   Morbid obesity ([place]) 02/20/2015   OSA on CPAP 02/20/2015   Paresthesia of arm 02/20/2015   Periodic limb movement 02/20/2015   Allergic rhinitis, seasonal 02/20/2015   Menopausal symptom 02/20/2015    Past Surgical History:  Procedure Laterality Date   ABDOMINAL HYSTERECTOMY     COLONOSCOPY WITH PROPOFOL N/A 05/26/2020   Procedure: COLONOSCOPY WITH PROPOFOL;  Surgeon: Jonathon Bellows, MD;  Location: [facility] ENDOSCOPY;  Service: Gastroenterology;  Laterality: N/A;   RETINAL TEAR REPAIR CRYOTHERAPY Right    [place] (Dr. Noel Journey)    Family History  Problem Relation Age of Onset   Mental illness Mother    Diabetes Mother    CVA Mother    Schizophrenia Mother    Diabetes Sister    Hypertension Sister    Diabetes Brother    Mental illness Brother    Bipolar disorder Brother    Schizophrenia Brother     Social History   Tobacco Use   Smoking status: Former    Packs/day: 0.10    Years: 15.00    Additional pack years: 0.00    Total pack years: 1.50    Types: Cigarettes    Quit date: 07/29/2004    Years since quitting: 18.2   Smokeless tobacco: Never  Substance Use Topics   Alcohol use: No    Alcohol/week: 0.0 standard drinks of alcohol     Current Outpatient Medications:    Alcohol Swabs (EASY TOUCH ALCOHOL PREP MEDIUM) 70 % PADS, 1 each by Does not apply route 3 (three) times daily as needed., Disp: 200 each, Rfl: 3   aspirin 81 MG tablet, Take 81 mg by mouth daily., Disp: , Rfl:    Continuous Blood Gluc Sensor (DEXCOM G7 SENSOR) MISC, 1 each by Does not apply route as directed., Disp: 9 each, Rfl: 1   EPINEPHrine  (EPIPEN 2-PAK) 0.3 mg/0.3 mL IJ SOAJ injection, Inject 0.3 mg into the muscle as needed for anaphylaxis., Disp: 2 each, Rfl: 0   Glucagon (GVOKE HYPOPEN 1-PACK) 1 MG/0.2ML SOAJ, Inject 1 mg into the skin as needed (for hypoglycemic episode)., Disp: 0.2 mL, Rfl: 3   insulin lispro (HUMALOG KWIKPEN) 100 UNIT/ML KwikPen, Check fsbs prior to meals , Hold if below 110, If glucose 110- 140 give 4 units, If glucose 140 to 180 give 6 units , If glucose 180 to 220  give 8 units, If glucose above 220 give 10 units, Disp: 45 mL, Rfl: 1   Insulin Pen Needle 32G X 6 MM  MISC, 1 each by Does not apply route 3 (three) times daily., Disp: 90 each, Rfl: 5   loratadine (CLARITIN) 10 MG tablet, Take 1 tablet (10 mg total) by mouth daily., Disp: 30 tablet, Rfl: 11   montelukast (SINGULAIR) 10 MG tablet, TAKE 1 TABLET BY MOUTH AT  BEDTIME, Disp: 90 tablet, Rfl: 3   tirzepatide (MOUNJARO) 7.5 MG/0.5ML Pen, Inject 7.5 mg into the skin once a week., Disp: 6 mL, Rfl: 0   TOUJEO MAX SOLOSTAR 300 UNIT/ML Solostar Pen, INJECT SUBCUTANEOUSLY 50 UNITS  INTO THE SKIN DAILY AT 12 NOON, Disp: 12 mL, Rfl: 4   triamcinolone ointment (KENALOG) 0.5 %, Apply 1 application topically 2 (two) times daily., Disp: 60 g, Rfl: 0   valACYclovir (VALTREX) 1000 MG tablet, Take 1 tablet (1,000 mg total) by mouth 2 (two) times daily. Prn, Disp: 60 tablet, Rfl: 0   valsartan (DIOVAN) 80 MG tablet, Take 1 tablet (80 mg total) by mouth daily., Disp: 90 tablet, Rfl: 3   ARIPiprazole (ABILIFY) 2 MG tablet, Take 1 tablet (2 mg total) by mouth daily., Disp: 90 tablet, Rfl: 1   Cholecalciferol (VITAMIN D3) 1.25 MG (50000 UT) capsule, Take 1 capsule (50,000 Units total) by mouth every 7 (seven) days., Disp: 12 capsule, Rfl: 1   metFORMIN (GLUCOPHAGE-XR) 750 MG 24 hr tablet, Take 2 tablets (1,500 mg total) by mouth daily with breakfast., Disp: 180 tablet, Rfl: 1   pantoprazole (PROTONIX) 40 MG tablet, Take 1 tablet (40 mg total) by mouth daily. (Patient not  taking: Reported on 10/11/2022), Disp: 90 tablet, Rfl: 3   pregabalin (LYRICA) 75 MG capsule, Take 1 capsule (75 mg total) by mouth 3 (three) times daily., Disp: 270 capsule, Rfl: 1   rosuvastatin (CRESTOR) 40 MG tablet, Daily for cholesterol, Disp: 90 tablet, Rfl: 3   sertraline (ZOLOFT) 100 MG tablet, Take 1 tablet (100 mg total) by mouth daily., Disp: 90 tablet, Rfl: 0   temazepam (RESTORIL) 30 MG capsule, Take 1 capsule (30 mg total) by mouth daily., Disp: 90 capsule, Rfl: 0  Allergies  Allergen Reactions   Ace Inhibitors     cough   Bee Venom     Bee   Dapagliflozin     hematuria   Ozempic (0.25 Or 0.5 Mg-Dose) [Semaglutide(0.25 Or 0.5mg -Dos)]     Bloating    Actos [Pioglitazone] Rash    I personally reviewed active problem list, medication list, allergies, family history, social history, health maintenance with the patient/caregiver today.   ROS  Constitutional: Negative for fever , positive for weight change.  Respiratory: Negative for cough and shortness of breath.   Cardiovascular: Negative for chest pain or palpitations.  Gastrointestinal: Negative for abdominal pain, no bowel changes.  Musculoskeletal: positive for gait problem but no  joint swelling.  Skin: Negative for rash.  Neurological: Negative for dizziness or headache.  No other specific complaints in a complete review of systems (except as listed in HPI above).   Objective  Vitals:   10/11/22 0815  BP: 116/68  Pulse: 93  Resp: 16  SpO2: 98%  Weight: 216 lb (98 kg)  Height: 5\' 7"  (1.702 m)    Body mass index is 33.83 kg/m.  Physical Exam  Constitutional: Patient appears well-developed and well-nourished. Obese  No distress.  HEENT: head atraumatic, normocephalic, pupils equal and reactive to light, neck supple Cardiovascular: Normal rate, regular rhythm and normal heart sounds.  No murmur heard. No BLE edema. Pulmonary/Chest: Effort normal and breath sounds normal. No  respiratory  distress. Abdominal: Soft.  There is no tenderness. Psychiatric: Patient has a normal mood and affect. behavior is normal. Judgment and thought content normal.    PHQ2/9:    10/11/2022    8:17 AM 07/02/2022   10:00 AM 06/28/2022    8:41 AM 04/10/2022    3:11 PM 03/29/2022    8:15 AM  Depression screen PHQ 2/9  Decreased Interest 1 0 1 0 1  Down, Depressed, Hopeless 1 0 1 0 0  PHQ - 2 Score 2 0 2 0 1  Altered sleeping 0 0 0 0 0  Tired, decreased energy 0 0 0 0 0  Change in appetite 0 0 0 0 1  Feeling bad or failure about yourself  0 0 0 0 0  Trouble concentrating 0 0 0 0 0  Moving slowly or fidgety/restless 0 0 0 0 0  Suicidal thoughts 0 0 0 0 0  PHQ-9 Score 2 0 2 0 2  Difficult doing work/chores   Not difficult at all Not difficult at all     phq 9 is positive   Fall Risk:    10/11/2022    8:17 AM 07/02/2022   10:00 AM 06/28/2022    8:38 AM 04/10/2022    3:10 PM 03/29/2022    8:15 AM  Fall Risk   Falls in the past year? 1 0 0 0 0  Number falls in past yr: 0 0  0 0  Injury with Fall? 0 0  0 0  Risk for fall due to : No Fall Risks No Fall Risks No Fall Risks  No Fall Risks  Follow up Falls prevention discussed Falls prevention discussed Falls prevention discussed;Education provided;Falls evaluation completed  Falls prevention discussed      Functional Status Survey: Is the patient deaf or have difficulty hearing?: No Does the patient have difficulty seeing, even when wearing glasses/contacts?: Yes Does the patient have difficulty concentrating, remembering, or making decisions?: No Does the patient have difficulty walking or climbing stairs?: Yes Does the patient have difficulty dressing or bathing?: No Does the patient have difficulty doing errands alone such as visiting a doctor's office or shopping?: No    Assessment & Plan  1. Mild recurrent major depression (HCC)  - ARIPiprazole (ABILIFY) 2 MG tablet; Take 1 tablet (2 mg total) by mouth daily.  Dispense: 90  tablet; Refill: 1  2. Diabetic polyneuropathy associated with type 2 diabetes mellitus (HCC)  - POCT HgB A1C - metFORMIN (GLUCOPHAGE-XR) 750 MG 24 hr tablet; Take 2 tablets (1,500 mg total) by mouth daily with breakfast.  Dispense: 180 tablet; Refill: 1 - tirzepatide (MOUNJARO) 7.5 MG/0.5ML Pen; Inject 7.5 mg into the skin once a week.  Dispense: 6 mL; Refill: 0  3. Lumbar back pain with radiculopathy affecting right lower extremity  - pregabalin (LYRICA) 75 MG capsule; Take 1 capsule (75 mg total) by mouth 3 (three) times daily.  Dispense: 270 capsule; Refill: 1  4. Dyslipidemia associated with type 2 diabetes mellitus (HCC)  - POCT HgB A1C - rosuvastatin (CRESTOR) 40 MG tablet; Daily for cholesterol  Dispense: 90 tablet; Refill: 3 - tirzepatide (MOUNJARO) 7.5 MG/0.5ML Pen; Inject 7.5 mg into the skin once a week.  Dispense: 6 mL; Refill: 0  5. Menopausal symptom  - sertraline (ZOLOFT) 100 MG tablet; Take 1 tablet (100 mg total) by mouth daily.  Dispense: 90 tablet; Refill: 0  6. Insomnia, persistent  - temazepam (RESTORIL) 30 MG capsule; Take 1 capsule (30  mg total) by mouth daily.  Dispense: 90 capsule; Refill: 0  7. OSA (obstructive sleep apnea)  - Ambulatory referral to Sleep Studies  ESS of 10   8. Vitamin D deficiency  - Cholecalciferol (VITAMIN D3) 1.25 MG (50000 UT) capsule; Take 1 capsule (50,000 Units total) by mouth every 7 (seven) days.  Dispense: 12 capsule; Refill: 1  9. Sciatic nerve pain, right   10. Hypertension associated with diabetes ([place])   11. Allergic to bees  - EPINEPHrine (EPIPEN 2-PAK) 0.3 mg/0.3 mL IJ SOAJ injection; Inject 0.3 mg into the muscle as needed for anaphylaxis.  Dispense: 2 each; Refill: 0

## 2022-10-11 ENCOUNTER — Ambulatory Visit (INDEPENDENT_AMBULATORY_CARE_PROVIDER_SITE_OTHER): Payer: Managed Care, Other (non HMO) | Admitting: Family Medicine

## 2022-10-11 ENCOUNTER — Encounter: Payer: Self-pay | Admitting: Family Medicine

## 2022-10-11 VITALS — BP 116/68 | HR 93 | Resp 16 | Ht 67.0 in | Wt 216.0 lb

## 2022-10-11 DIAGNOSIS — E559 Vitamin D deficiency, unspecified: Secondary | ICD-10-CM

## 2022-10-11 DIAGNOSIS — E1142 Type 2 diabetes mellitus with diabetic polyneuropathy: Secondary | ICD-10-CM

## 2022-10-11 DIAGNOSIS — M5416 Radiculopathy, lumbar region: Secondary | ICD-10-CM

## 2022-10-11 DIAGNOSIS — M5431 Sciatica, right side: Secondary | ICD-10-CM

## 2022-10-11 DIAGNOSIS — G4733 Obstructive sleep apnea (adult) (pediatric): Secondary | ICD-10-CM

## 2022-10-11 DIAGNOSIS — F330 Major depressive disorder, recurrent, mild: Secondary | ICD-10-CM | POA: Diagnosis not present

## 2022-10-11 DIAGNOSIS — I152 Hypertension secondary to endocrine disorders: Secondary | ICD-10-CM

## 2022-10-11 DIAGNOSIS — E1159 Type 2 diabetes mellitus with other circulatory complications: Secondary | ICD-10-CM

## 2022-10-11 DIAGNOSIS — E785 Hyperlipidemia, unspecified: Secondary | ICD-10-CM

## 2022-10-11 DIAGNOSIS — N951 Menopausal and female climacteric states: Secondary | ICD-10-CM

## 2022-10-11 DIAGNOSIS — Z91030 Bee allergy status: Secondary | ICD-10-CM

## 2022-10-11 DIAGNOSIS — G4700 Insomnia, unspecified: Secondary | ICD-10-CM

## 2022-10-11 DIAGNOSIS — E1165 Type 2 diabetes mellitus with hyperglycemia: Secondary | ICD-10-CM

## 2022-10-11 DIAGNOSIS — E1169 Type 2 diabetes mellitus with other specified complication: Secondary | ICD-10-CM | POA: Diagnosis not present

## 2022-10-11 LAB — POCT GLYCOSYLATED HEMOGLOBIN (HGB A1C): Hemoglobin A1C: 6.7 % — AB (ref 4.0–5.6)

## 2022-10-11 MED ORDER — ROSUVASTATIN CALCIUM 40 MG PO TABS: ORAL_TABLET | ORAL | 3 refills | Status: DC

## 2022-10-11 MED ORDER — ARIPIPRAZOLE 2 MG PO TABS: 2.0000 mg | ORAL_TABLET | Freq: Every day | ORAL | 1 refills | Status: DC

## 2022-10-11 MED ORDER — EPINEPHRINE 0.3 MG/0.3ML IJ SOAJ: 0.3000 mg | INTRAMUSCULAR | 0 refills | Status: AC | PRN

## 2022-10-11 MED ORDER — METFORMIN HCL ER 750 MG PO TB24: 1500.0000 mg | ORAL_TABLET | Freq: Every day | ORAL | 1 refills | Status: DC

## 2022-10-11 MED ORDER — TEMAZEPAM 30 MG PO CAPS: 30.0000 mg | ORAL_CAPSULE | Freq: Every day | ORAL | 0 refills | Status: DC

## 2022-10-11 MED ORDER — PREGABALIN 75 MG PO CAPS: 75.0000 mg | ORAL_CAPSULE | Freq: Three times a day (TID) | ORAL | 1 refills | Status: DC

## 2022-10-11 MED ORDER — SERTRALINE HCL 100 MG PO TABS: 100.0000 mg | ORAL_TABLET | Freq: Every day | ORAL | 0 refills | Status: DC

## 2022-10-11 MED ORDER — VITAMIN D3 1.25 MG (50000 UT) PO CAPS: 50000.0000 [IU] | ORAL_CAPSULE | ORAL | 1 refills | Status: DC

## 2022-10-11 MED ORDER — TIRZEPATIDE 7.5 MG/0.5ML ~~LOC~~ SOAJ: 7.5000 mg | SUBCUTANEOUS | 0 refills | Status: DC

## 2022-10-14 ENCOUNTER — Telehealth: Payer: Self-pay | Admitting: Family Medicine

## 2022-10-14 NOTE — Telephone Encounter (Unsigned)
Copied from [place] [id number]. Topic: Referral - Question >> Oct 14, 2022  4:14 PM Ludger Nutting wrote: Cindee with [facility] called to get a copy of patients sleep study. Please advise.

## 2022-10-14 NOTE — Telephone Encounter (Signed)
Unable to locate last sleep study to provide. Returned call to Cindee to make her aware, left voicemail.

## 2022-10-16 ENCOUNTER — Encounter: Payer: Self-pay | Admitting: Family Medicine

## 2022-10-16 NOTE — Telephone Encounter (Signed)
Cindee with [facility] is calling in requesting to speak with Katharine Look. Cindee says they need a different order for either a split night sleep study polysonogram because pt doesn't have the paperwork from her previous sleep study.

## 2022-10-16 NOTE — Telephone Encounter (Signed)
Returned call to [employer], left voicemail to see if she can fax [place] an order for signature.

## 2022-10-19 ENCOUNTER — Other Ambulatory Visit: Payer: Self-pay | Admitting: Family Medicine

## 2022-10-19 DIAGNOSIS — E1169 Type 2 diabetes mellitus with other specified complication: Secondary | ICD-10-CM

## 2022-10-28 ENCOUNTER — Telehealth: Payer: Self-pay | Admitting: Family Medicine

## 2022-10-28 ENCOUNTER — Other Ambulatory Visit: Payer: Self-pay

## 2022-10-28 DIAGNOSIS — G4733 Obstructive sleep apnea (adult) (pediatric): Secondary | ICD-10-CM

## 2022-10-28 MED ORDER — TIRZEPATIDE 5 MG/0.5ML ~~LOC~~ SOAJ: 5.0000 mg | SUBCUTANEOUS | 2 refills | Status: DC

## 2022-10-28 NOTE — Telephone Encounter (Signed)
Order placed for new sleep study.

## 2022-10-28 NOTE — Telephone Encounter (Unsigned)
Copied from [place]. Topic: Referral - Status >> Oct 28, 2022  9:26 AM Cyndi Bender wrote: Reason for CRM: Brooke with [facility] called for an update on pt sleep study. If pt has not had a sleep study previously they will need an order. Cb# [phone]

## 2022-11-14 NOTE — Telephone Encounter (Addendum)
Cindy from [facility] is calling to f/u on pt sleep study denial.  Arline Asp stated they just need to know if the provider is going to try to appeal or if they should cancel the appointment.   Please advise.

## 2022-11-15 NOTE — Telephone Encounter (Signed)
Reprinted and re faxed

## 2022-11-16 ENCOUNTER — Other Ambulatory Visit: Payer: Self-pay | Admitting: Family Medicine

## 2022-11-17 ENCOUNTER — Other Ambulatory Visit: Payer: Self-pay | Admitting: Family Medicine

## 2022-11-17 DIAGNOSIS — E1165 Type 2 diabetes mellitus with hyperglycemia: Secondary | ICD-10-CM

## 2022-11-17 DIAGNOSIS — E1169 Type 2 diabetes mellitus with other specified complication: Secondary | ICD-10-CM

## 2022-12-08 ENCOUNTER — Other Ambulatory Visit: Payer: Self-pay | Admitting: Family Medicine

## 2022-12-08 DIAGNOSIS — E1142 Type 2 diabetes mellitus with diabetic polyneuropathy: Secondary | ICD-10-CM

## 2022-12-08 DIAGNOSIS — E1169 Type 2 diabetes mellitus with other specified complication: Secondary | ICD-10-CM

## 2022-12-09 ENCOUNTER — Other Ambulatory Visit: Payer: Self-pay

## 2022-12-29 ENCOUNTER — Other Ambulatory Visit: Payer: Self-pay | Admitting: Family Medicine

## 2023-01-11 ENCOUNTER — Other Ambulatory Visit: Payer: Self-pay | Admitting: Family Medicine

## 2023-01-11 DIAGNOSIS — K219 Gastro-esophageal reflux disease without esophagitis: Secondary | ICD-10-CM

## 2023-01-12 ENCOUNTER — Other Ambulatory Visit: Payer: Self-pay | Admitting: Family Medicine

## 2023-01-12 DIAGNOSIS — G4700 Insomnia, unspecified: Secondary | ICD-10-CM

## 2023-01-13 NOTE — Progress Notes (Unsigned)
Name: Joyce Blackburn   MRN: [id number]    DOB: 1961/02/03   Date:01/14/2023       Progress Note  Subjective  Chief Complaint  Follow Up  HPI  DMII: A1C is down from 11.5 % down to  6.7% and now 6%   currently on Mounjaro 7.5 mg , Metformin 1500 mg  ,, Toujeo 50 units, and pre meal insulin - fasting is now below 100 , we will go down on one metformin and monitor ( she prefers coming off Metformin first)   She is using her Dexcom 7 and has decreased carb intake and is doing much better. She has associated dyslipidemia, HTN and obesity. Weight is down, LDL is at goal on statin therapy, taking ARB   GERD: she also has a history of gastric ulcer over 20  years ago. She states occasionally still has nausea with certain smells, like grease. She was seen by Dr. Lars Pinks and had multiple labs done for evaluation of fatty liver, HIDA was normal. She is on Pantoprazole ( Dexilant no longer covered)  she had stopped Rybelsus and even though she is on Mounjarno symptoms are controlled Unchanged    OSA: she stopped wearing CPAP again, unchanged . Continue using    Dyslipidemia: she is current taking Rosuvastatin , LDL has been at goal - last level was 49 . Stable    HTN: bp is towards low end of normal, she thinks it is because she has been fasting, did not drink any fluids, states very seldom has orthostatic changes.  She denies pain, palpitation or SOB. She is compliant with medication , BP is still low, we will change dose to 40 mg dose    Vitamin D deficiency: she would like to resume rx vitamin D   Menopause/major depression mild : she states Zoloft 100 mg  and states hot flashes is still present but night sweats has improved she is doing well, she was feeling more emotional and we added Abilify back in 02/2020. Continue current regiment . Phq 9 today is 2 and stable She wants to continue current regiment    Insomnia: she is taking Temazepam and it helps her fall and stay asleep, no side effects of  medications  - denies daytime somnolence. We will send a refill today    Obesity  she is now on Mounjarno 7.5 mg  and since she started wearing the Dexcom 7 she has been more compliant with a diabetic diet and weight is down another  8 lbs in the past 3 months .    Migraine headaches:  Episodes start suddenly associated with nausea and vomiting.  states Bernita Raisin works but was making her sleepy. She states not migraine episodes in about one year   Sciatica :long history of recurrent lower back pain since 2019 , but since 2022  has intermittent radiculitis . She  took prednisone in July 2022, she is taking Lyrica, no longer on Tylenol # 3, she is seeing chiropractor and doing some home exercises but states after sitting for a prolonged period of time, or walking less than a quarter mile she develops right thigh pain like a throbbing sensation. She was on a cruise recently and did well, she avoided taking the steps   MRI Dec 2022   IMPRESSION:  1. Retrolisthesis of L3-L4 and L4-L5 with posterior degenerative changes causes mild lateral recess and foraminal stenosis at these levels.  2.  Bulging degenerative disc and retrolisthesis at L5-S1. Mild foraminal encroachment  and far lateral displacement of the L5 nerves. Correlate for L5 radiculopathy.  3.  No central stenosis at any level. Upper lumbar spine is benign   B12 deficiency: needs to take SL B12 otc 1000 mcg a few times a week   Patient Active Problem List   Diagnosis Date Noted   OSA (obstructive sleep apnea) 10/11/2022   Mild recurrent major depression (HCC) 12/26/2021   Lumbar back pain with radiculopathy affecting right lower extremity 12/26/2021   Sciatic nerve pain, right 12/26/2021   Major depression in remission (HCC) 12/19/2017   Fatty liver 03/04/2016   Vitamin D deficiency 03/04/2016   Gastroesophageal reflux disease without esophagitis 07/04/2015   Diabetic polyneuropathy associated with type 2 diabetes mellitus (HCC)  03/03/2015   Peripheral neuropathy 03/03/2015   Allergic to bees 02/20/2015   Benign hypertension 02/20/2015   Insomnia, persistent 02/20/2015   Chronic idiopathic constipation 02/20/2015   Dyslipidemia 02/20/2015   Dermatitis, eczematoid 02/20/2015   H/O gastric ulcer 02/20/2015   Herpes 02/20/2015   Migraine without aura and without status migrainosus, not intractable 02/20/2015   Morbid obesity (HCC) 02/20/2015   Paresthesia of arm 02/20/2015   Periodic limb movement 02/20/2015   Allergic rhinitis, seasonal 02/20/2015   Menopausal symptom 02/20/2015    Past Surgical History:  Procedure Laterality Date   ABDOMINAL HYSTERECTOMY     COLONOSCOPY WITH PROPOFOL N/A 05/26/2020   Procedure: COLONOSCOPY WITH PROPOFOL;  Surgeon: Wyline Mood, MD;  Location: [facility] ENDOSCOPY;  Service: Gastroenterology;  Laterality: N/A;   RETINAL TEAR REPAIR CRYOTHERAPY Right    [facility] (Dr. Robin Searing)    Family History  Problem Relation Age of Onset   Mental illness Mother    Diabetes Mother    CVA Mother    Schizophrenia Mother    Diabetes Sister    Hypertension Sister    Diabetes Brother    Mental illness Brother    Bipolar disorder Brother    Schizophrenia Brother     Social History   Tobacco Use   Smoking status: Former    Packs/day: 0.10    Years: 15.00    Additional pack years: 0.00    Total pack years: 1.50    Types: Cigarettes    Quit date: 07/29/2004    Years since quitting: 18.4   Smokeless tobacco: Never  Substance Use Topics   Alcohol use: No    Alcohol/week: 0.0 standard drinks of alcohol     Current Outpatient Medications:    Alcohol Swabs (B-D SINGLE USE SWABS REGULAR) PADS, USE 3 TIMES DAILY AS NEEDED, Disp: 300 each, Rfl: 1   ARIPiprazole (ABILIFY) 2 MG tablet, Take 1 tablet (2 mg total) by mouth daily., Disp: 90 tablet, Rfl: 1   aspirin 81 MG tablet, Take 81 mg by mouth daily., Disp: , Rfl:    Cholecalciferol (VITAMIN D3) 1.25 MG (50000 UT) capsule, Take 1  capsule (50,000 Units total) by mouth every 7 (seven) days., Disp: 12 capsule, Rfl: 1   Continuous Glucose Sensor (DEXCOM G7 SENSOR) MISC, USE AS DIRECTED, Disp: 9 each, Rfl: 3   EPINEPHrine (EPIPEN 2-PAK) 0.3 mg/0.3 mL IJ SOAJ injection, Inject 0.3 mg into the muscle as needed for anaphylaxis., Disp: 2 each, Rfl: 0   Glucagon (GVOKE HYPOPEN 1-PACK) 1 MG/0.2ML SOAJ, Inject 1 mg into the skin as needed (for hypoglycemic episode)., Disp: 0.2 mL, Rfl: 3   glucose blood (CONTOUR NEXT TEST) test strip, USE AS DIRECTED ONCE DAILY, Disp: 100 strip, Rfl: 3   insulin  glargine, 2 Unit Dial, (TOUJEO MAX SOLOSTAR) 300 UNIT/ML Solostar Pen, INJECT SUBCUTANEOUSLY 50 UNITS  DAILY AT NOON, Disp: 18 mL, Rfl: 0   insulin lispro (HUMALOG KWIKPEN) 100 UNIT/ML KwikPen, Check fsbs prior to meals , Hold if below 110, If glucose 110- 140 give 4 units, If glucose 140 to 180 give 6 units , If glucose 180 to 220  give 8 units, If glucose above 220 give 10 units, Disp: 45 mL, Rfl: 1   Insulin Pen Needle (NOVOFINE PEN NEEDLE) 32G X 6 MM MISC, USE TO INJECT INTO THE SKIN  TWICE DAILY, Disp: 180 each, Rfl: 0   loratadine (CLARITIN) 10 MG tablet, Take 1 tablet (10 mg total) by mouth daily., Disp: 30 tablet, Rfl: 11   montelukast (SINGULAIR) 10 MG tablet, TAKE 1 TABLET BY MOUTH AT  BEDTIME, Disp: 90 tablet, Rfl: 3   pantoprazole (PROTONIX) 40 MG tablet, Take 1 tablet (40 mg total) by mouth daily., Disp: 90 tablet, Rfl: 3   pregabalin (LYRICA) 75 MG capsule, Take 1 capsule (75 mg total) by mouth 3 (three) times daily., Disp: 270 capsule, Rfl: 1   rosuvastatin (CRESTOR) 40 MG tablet, Daily for cholesterol, Disp: 90 tablet, Rfl: 3   triamcinolone ointment (KENALOG) 0.5 %, Apply 1 application topically 2 (two) times daily., Disp: 60 g, Rfl: 0   valACYclovir (VALTREX) 1000 MG tablet, Take 1 tablet (1,000 mg total) by mouth 2 (two) times daily. Prn, Disp: 60 tablet, Rfl: 0   metFORMIN (GLUCOPHAGE-XR) 750 MG 24 hr tablet, Take 1 tablet (750  mg total) by mouth daily with breakfast., Disp: 90 tablet, Rfl: 0   sertraline (ZOLOFT) 100 MG tablet, Take 1 tablet (100 mg total) by mouth daily., Disp: 90 tablet, Rfl: 0   temazepam (RESTORIL) 30 MG capsule, Take 1 capsule (30 mg total) by mouth daily., Disp: 90 capsule, Rfl: 0   tirzepatide (MOUNJARO) 7.5 MG/0.5ML Pen, Inject 7.5 mg into the skin once a week., Disp: 6 mL, Rfl: 0   valsartan (DIOVAN) 40 MG tablet, Take 1 tablet (40 mg total) by mouth daily., Disp: 90 tablet, Rfl: 0  Allergies  Allergen Reactions   Ace Inhibitors     cough   Bee Venom     Bee   Dapagliflozin     hematuria   Ozempic (0.25 Or 0.5 Mg-Dose) [Semaglutide(0.25 Or 0.5mg -Dos)]     Bloating    Actos [Pioglitazone] Rash    I personally reviewed active problem list, medication list, allergies, family history, social history, health maintenance with the patient/caregiver today.   ROS  Constitutional: Negative for fever, positive for  weight change.  Respiratory: Negative for cough and shortness of breath.   Cardiovascular: Negative for chest pain or palpitations.  Gastrointestinal: Negative for abdominal pain, no bowel changes.  Musculoskeletal: Negative for gait problem or joint swelling.  Skin: Negative for rash.  Neurological: Negative for dizziness or headache.  No other specific complaints in a complete review of systems (except as listed in HPI above).   Objective  Vitals:   01/14/23 0759  BP: 116/70  Pulse: 93  Resp: 16  Temp: 97.8 F (36.6 C)  TempSrc: Oral  SpO2: 100%  Weight: 208 lb 3.2 oz (94.4 kg)  Height: 5\' 7"  (1.702 m)    Body mass index is 32.61 kg/m.  Physical Exam  Constitutional: Patient appears well-developed and well-nourished. Obese  No distress.  HEENT: head atraumatic, normocephalic, pupils equal and reactive to light, neck supple Cardiovascular: Normal rate, regular rhythm  and normal heart sounds.  No murmur heard. No BLE edema. Pulmonary/Chest: Effort normal and  breath sounds normal. No respiratory distress. Abdominal: Soft.  There is no tenderness. Psychiatric: Patient has a normal mood and affect. behavior is normal. Judgment and thought content normal.   Recent Results (from the past 2160 hour(s))  POCT HgB A1C     Status: Abnormal   Collection Time: 01/14/23  8:00 AM  Result Value Ref Range   Hemoglobin A1C 6.0 (A) 4.0 - 5.6 %   HbA1c POC (<> result, manual entry)     HbA1c, POC (prediabetic range)     HbA1c, POC (controlled diabetic range)       PHQ2/9:    01/14/2023    7:58 AM 10/11/2022    8:17 AM 07/02/2022   10:00 AM 06/28/2022    8:41 AM 04/10/2022    3:11 PM  Depression screen PHQ 2/9  Decreased Interest 1 1 0 1 0  Down, Depressed, Hopeless 0 1 0 1 0  PHQ - 2 Score 1 2 0 2 0  Altered sleeping 0 0 0 0 0  Tired, decreased energy 0 0 0 0 0  Change in appetite 0 0 0 0 0  Feeling bad or failure about yourself  0 0 0 0 0  Trouble concentrating 0 0 0 0 0  Moving slowly or fidgety/restless 0 0 0 0 0  Suicidal thoughts 0 0 0 0 0  PHQ-9 Score 1 2 0 2 0  Difficult doing work/chores Not difficult at all   Not difficult at all Not difficult at all    phq 9 is negative   Fall Risk:    01/14/2023    7:46 AM 10/11/2022    8:17 AM 07/02/2022   10:00 AM 06/28/2022    8:38 AM 04/10/2022    3:10 PM  Fall Risk   Falls in the past year? 1 1 0 0 0  Number falls in past yr: 1 0 0  0  Injury with Fall? 0 0 0  0  Risk for fall due to : History of fall(s) No Fall Risks No Fall Risks No Fall Risks   Follow up Falls prevention discussed;Education provided;Falls evaluation completed Falls prevention discussed Falls prevention discussed Falls prevention discussed;Education provided;Falls evaluation completed       Functional Status Survey: Is the patient deaf or have difficulty hearing?: No Does the patient have difficulty seeing, even when wearing glasses/contacts?: Yes Does the patient have difficulty concentrating, remembering, or making  decisions?: No Does the patient have difficulty walking or climbing stairs?: Yes Does the patient have difficulty dressing or bathing?: No Does the patient have difficulty doing errands alone such as visiting a doctor's office or shopping?: No    Assessment & Plan  1. Diabetic polyneuropathy associated with type 2 diabetes mellitus (HCC)  - POCT HgB A1C - metFORMIN (GLUCOPHAGE-XR) 750 MG 24 hr tablet; Take 1 tablet (750 mg total) by mouth daily with breakfast.  Dispense: 90 tablet; Refill: 0 - tirzepatide (MOUNJARO) 7.5 MG/0.5ML Pen; Inject 7.5 mg into the skin once a week.  Dispense: 6 mL; Refill: 0  2. Dyslipidemia associated with type 2 diabetes mellitus (HCC)  - tirzepatide (MOUNJARO) 7.5 MG/0.5ML Pen; Inject 7.5 mg into the skin once a week.  Dispense: 6 mL; Refill: 0  3. Hypertension associated with diabetes (HCC)  - valsartan (DIOVAN) 40 MG tablet; Take 1 tablet (40 mg total) by mouth daily.  Dispense: 90 tablet; Refill: 0  4. Insomnia, persistent  - temazepam (RESTORIL) 30 MG capsule; Take 1 capsule (30 mg total) by mouth daily.  Dispense: 90 capsule; Refill: 0  5. Menopausal symptom  - sertraline (ZOLOFT) 100 MG tablet; Take 1 tablet (100 mg total) by mouth daily.  Dispense: 90 tablet; Refill: 0  6. Benign hypertension  - valsartan (DIOVAN) 40 MG tablet; Take 1 tablet (40 mg total) by mouth daily.  Dispense: 90 tablet; Refill: 0  7. Vitamin D deficiency   8. Sciatic nerve pain, right   9. Gastroesophageal reflux disease without esophagitis  Stable  10. OSA on CPAP

## 2023-01-14 ENCOUNTER — Ambulatory Visit (INDEPENDENT_AMBULATORY_CARE_PROVIDER_SITE_OTHER): Payer: Managed Care, Other (non HMO) | Admitting: Family Medicine

## 2023-01-14 ENCOUNTER — Encounter: Payer: Self-pay | Admitting: Family Medicine

## 2023-01-14 VITALS — BP 116/70 | HR 93 | Temp 97.8°F | Resp 16 | Ht 67.0 in | Wt 208.2 lb

## 2023-01-14 DIAGNOSIS — E669 Obesity, unspecified: Secondary | ICD-10-CM | POA: Insufficient documentation

## 2023-01-14 DIAGNOSIS — E559 Vitamin D deficiency, unspecified: Secondary | ICD-10-CM

## 2023-01-14 DIAGNOSIS — E1169 Type 2 diabetes mellitus with other specified complication: Secondary | ICD-10-CM | POA: Diagnosis not present

## 2023-01-14 DIAGNOSIS — E1142 Type 2 diabetes mellitus with diabetic polyneuropathy: Secondary | ICD-10-CM | POA: Diagnosis not present

## 2023-01-14 DIAGNOSIS — I10 Essential (primary) hypertension: Secondary | ICD-10-CM

## 2023-01-14 DIAGNOSIS — G4733 Obstructive sleep apnea (adult) (pediatric): Secondary | ICD-10-CM

## 2023-01-14 DIAGNOSIS — E1159 Type 2 diabetes mellitus with other circulatory complications: Secondary | ICD-10-CM | POA: Diagnosis not present

## 2023-01-14 DIAGNOSIS — Z794 Long term (current) use of insulin: Secondary | ICD-10-CM

## 2023-01-14 DIAGNOSIS — Z7984 Long term (current) use of oral hypoglycemic drugs: Secondary | ICD-10-CM

## 2023-01-14 DIAGNOSIS — G4700 Insomnia, unspecified: Secondary | ICD-10-CM

## 2023-01-14 DIAGNOSIS — K219 Gastro-esophageal reflux disease without esophagitis: Secondary | ICD-10-CM

## 2023-01-14 DIAGNOSIS — Z7985 Long-term (current) use of injectable non-insulin antidiabetic drugs: Secondary | ICD-10-CM | POA: Diagnosis not present

## 2023-01-14 DIAGNOSIS — M5431 Sciatica, right side: Secondary | ICD-10-CM

## 2023-01-14 DIAGNOSIS — N951 Menopausal and female climacteric states: Secondary | ICD-10-CM

## 2023-01-14 DIAGNOSIS — E785 Hyperlipidemia, unspecified: Secondary | ICD-10-CM

## 2023-01-14 LAB — POCT GLYCOSYLATED HEMOGLOBIN (HGB A1C): Hemoglobin A1C: 6 % — AB (ref 4.0–5.6)

## 2023-01-14 MED ORDER — SERTRALINE HCL 100 MG PO TABS: 100.0000 mg | ORAL_TABLET | Freq: Every day | ORAL | 0 refills | Status: DC

## 2023-01-14 MED ORDER — TEMAZEPAM 30 MG PO CAPS: 30.0000 mg | ORAL_CAPSULE | Freq: Every day | ORAL | 0 refills | Status: DC

## 2023-01-14 MED ORDER — VALSARTAN 40 MG PO TABS: 40.0000 mg | ORAL_TABLET | Freq: Every day | ORAL | 0 refills | Status: DC

## 2023-01-14 MED ORDER — METFORMIN HCL ER 750 MG PO TB24: 750.0000 mg | ORAL_TABLET | Freq: Every day | ORAL | 0 refills | Status: DC

## 2023-01-14 MED ORDER — MOUNJARO 7.5 MG/0.5ML ~~LOC~~ SOAJ
7.5000 mg | SUBCUTANEOUS | 0 refills | Status: DC
Start: 2023-01-14 — End: 2023-04-17

## 2023-01-25 ENCOUNTER — Other Ambulatory Visit: Payer: Self-pay | Admitting: Family Medicine

## 2023-01-25 DIAGNOSIS — K219 Gastro-esophageal reflux disease without esophagitis: Secondary | ICD-10-CM

## 2023-01-26 ENCOUNTER — Other Ambulatory Visit: Payer: Self-pay | Admitting: Family Medicine

## 2023-01-26 DIAGNOSIS — E1165 Type 2 diabetes mellitus with hyperglycemia: Secondary | ICD-10-CM

## 2023-02-18 ENCOUNTER — Other Ambulatory Visit: Payer: Self-pay | Admitting: Family Medicine

## 2023-02-18 DIAGNOSIS — E1165 Type 2 diabetes mellitus with hyperglycemia: Secondary | ICD-10-CM

## 2023-03-11 ENCOUNTER — Other Ambulatory Visit: Payer: Self-pay | Admitting: Family Medicine

## 2023-03-11 DIAGNOSIS — F330 Major depressive disorder, recurrent, mild: Secondary | ICD-10-CM

## 2023-03-16 ENCOUNTER — Other Ambulatory Visit: Payer: Self-pay | Admitting: Family Medicine

## 2023-04-16 ENCOUNTER — Other Ambulatory Visit: Payer: Self-pay | Admitting: Family Medicine

## 2023-04-16 DIAGNOSIS — Z1231 Encounter for screening mammogram for malignant neoplasm of breast: Secondary | ICD-10-CM

## 2023-04-16 NOTE — Progress Notes (Unsigned)
Name: Joyce Blackburn   MRN: [id number]    DOB: [birth year]   Date:04/17/2023       Progress Note  Subjective  Chief Complaint  Follow Up  HPI  DMII: A1C is down from 11.5 % down to  6.7% , 6% we went down on the dose of Metformin but she decided to go back to 1500 mg daily due to glucose going up. A1C today is 6.1 %. Discussed risk of hypoglycemia, fasting levels in the 90's, try going down on toujeo dose to 48 units to keep fasting between 100-140 . She was advised to continue  Mounjaro 7.5 mg , Metformin 1500 mg  and pre meal insulin   She is using her Dexcom 7 and has decreased carb intake and is doing much better. She has associated dyslipidemia, HTN and obesity.   GERD: she also has a history of gastric ulcer over 20  years ago. She states occasionally still has nausea with certain smells, like grease. She was seen by Dr. Lars Pinks and had multiple labs done for evaluation of fatty liver, HIDA was normal. She is on Pantoprazole ( Dexilant no longer covered)  she had stopped Rybelsus and even though she is on Mounjarno symptoms are controlled.   OSA: she is back on CPAP every night    Dyslipidemia: she is current taking Rosuvastatin , LDL has been at goal - last level was 49 . We will recheck labs    HTN: bp is still low, she is taking half of the dose of valsartan 20 mg daily now, discussed switching to losartan 25 mg for her next fill    Vitamin D deficiency: she is not taking vitamin D otc at this time  Menopause/major depression mild : she states Zoloft 100 mg  and states hot flashes is still present but night sweats has improved she is doing well, she was feeling more emotional and we added Abilify back in 02/2020. Continue current regiment . Phq 9 today is 1  and stable    Insomnia: she is taking Temazepam and it helps her fall and stay asleep, no side effects of medications  - denies daytime somnolence. Needs a refill today    Obesity  she is now on Mounjarno 7.5 mg  and since she  started wearing the Dexcom 7 she has been more compliant with a diabetic diet and weight is now stable, down to 208 lbs for the past 3 months . Her weight one year ago was 214 lbs   Migraine headaches:  Episodes start suddenly associated with nausea and vomiting.  states Bernita Raisin works but was making her sleepy. She states not migraine episodes in about one year and no longer needs medication   Sciatica :long history of recurrent lower back pain since 2019 , but since 2022  has intermittent radiculitis . She  took prednisone in July 2022, she is taking Lyrica, no longer on Tylenol # 3, she is seeing chiropractor and doing some home exercises but states after sitting for a prolonged period of time, she has been able to walk a little further lately. She states Lyrica seems to be working well for her   MRI Dec 2022   IMPRESSION:  1. Retrolisthesis of L3-L4 and L4-L5 with posterior degenerative changes causes mild lateral recess and foraminal stenosis at these levels.  2.  Bulging degenerative disc and retrolisthesis at L5-S1. Mild foraminal encroachment and far lateral displacement of the L5 nerves. Correlate for L5 radiculopathy.  3.  No central stenosis at any level. Upper lumbar spine is benign   B12 deficiency: needs to take SL B12 otc 1000 mcg , we will recheck level   Patient Active Problem List   Diagnosis Date Noted   Obesity (BMI 30.0-34.9) 01/14/2023   OSA (obstructive sleep apnea) 10/11/2022   Mild recurrent major depression (HCC) 12/26/2021   Lumbar back pain with radiculopathy affecting right lower extremity 12/26/2021   Sciatic nerve pain, right 12/26/2021   Major depression in remission (HCC) 12/19/2017   Fatty liver 03/04/2016   Vitamin D deficiency 03/04/2016   Gastroesophageal reflux disease without esophagitis 07/04/2015   Diabetic polyneuropathy associated with type 2 diabetes mellitus (HCC) 03/03/2015   Peripheral neuropathy 03/03/2015   Allergic to bees 02/20/2015    Benign hypertension 02/20/2015   Insomnia, persistent 02/20/2015   Chronic idiopathic constipation 02/20/2015   Dyslipidemia 02/20/2015   Dermatitis, eczematoid 02/20/2015   H/O gastric ulcer 02/20/2015   Herpes 02/20/2015   Migraine without aura and without status migrainosus, not intractable 02/20/2015   Morbid obesity (HCC) 02/20/2015   Paresthesia of arm 02/20/2015   Periodic limb movement 02/20/2015   Allergic rhinitis, seasonal 02/20/2015   Menopausal symptom 02/20/2015    Past Surgical History:  Procedure Laterality Date   ABDOMINAL HYSTERECTOMY     COLONOSCOPY WITH PROPOFOL N/A 05/26/2020   Procedure: COLONOSCOPY WITH PROPOFOL;  Surgeon: Wyline Mood, MD;  Location: [facility];  Service: Gastroenterology;  Laterality: N/A;   RETINAL TEAR REPAIR CRYOTHERAPY Right    [facility] (Dr. Robin Searing)    Family History  Problem Relation Age of Onset   Mental illness Mother    Diabetes Mother    CVA Mother    Schizophrenia Mother    Diabetes Sister    Hypertension Sister    Diabetes Brother    Mental illness Brother    Bipolar disorder Brother    Schizophrenia Brother     Social History   Tobacco Use   Smoking status: Former    Current packs/day: 0.00    Average packs/day: 0.1 packs/day for 15.0 years (1.5 ttl pk-yrs)    Types: Cigarettes    Start date: 07/29/1989    Quit date: 07/29/2004    Years since quitting: 18.7   Smokeless tobacco: Never  Substance Use Topics   Alcohol use: No    Alcohol/week: 0.0 standard drinks of alcohol     Current Outpatient Medications:    Alcohol Swabs (B-D SINGLE USE SWABS REGULAR) PADS, USE 3 TIMES DAILY AS NEEDED, Disp: 300 each, Rfl: 1   aspirin 81 MG tablet, Take 81 mg by mouth daily., Disp: , Rfl:    Cholecalciferol (VITAMIN D3) 1.25 MG (50000 UT) capsule, Take 1 capsule (50,000 Units total) by mouth every 7 (seven) days., Disp: 12 capsule, Rfl: 1   Continuous Glucose Sensor (DEXCOM G7 SENSOR) MISC, USE AS DIRECTED, Disp: 9  each, Rfl: 3   EPINEPHrine (EPIPEN 2-PAK) 0.3 mg/0.3 mL IJ SOAJ injection, Inject 0.3 mg into the muscle as needed for anaphylaxis., Disp: 2 each, Rfl: 0   Glucagon (GVOKE HYPOPEN 1-PACK) 1 MG/0.2ML SOAJ, Inject 1 mg into the skin as needed (for hypoglycemic episode)., Disp: 0.2 mL, Rfl: 3   glucose blood (CONTOUR NEXT TEST) test strip, USE AS DIRECTED ONCE DAILY, Disp: 100 strip, Rfl: 3   loratadine (CLARITIN) 10 MG tablet, Take 1 tablet (10 mg total) by mouth daily., Disp: 30 tablet, Rfl: 11   montelukast (SINGULAIR) 10 MG tablet, TAKE 1 TABLET BY  MOUTH AT  BEDTIME, Disp: 90 tablet, Rfl: 3   NOVOFINE PEN NEEDLE 32G X 6 MM MISC, USE TO INJECT INTO THE SKIN  TWICE DAILY, Disp: 180 each, Rfl: 0   pantoprazole (PROTONIX) 40 MG tablet, TAKE 1 TABLET BY MOUTH DAILY, Disp: 90 tablet, Rfl: 1   rosuvastatin (CRESTOR) 40 MG tablet, Daily for cholesterol, Disp: 90 tablet, Rfl: 3   triamcinolone ointment (KENALOG) 0.5 %, Apply 1 application topically 2 (two) times daily., Disp: 60 g, Rfl: 0   valACYclovir (VALTREX) 1000 MG tablet, Take 1 tablet (1,000 mg total) by mouth 2 (two) times daily. Prn, Disp: 60 tablet, Rfl: 0   ARIPiprazole (ABILIFY) 2 MG tablet, Take 1 tablet (2 mg total) by mouth daily., Disp: 90 tablet, Rfl: 1   insulin glargine, 2 Unit Dial, (TOUJEO MAX SOLOSTAR) 300 UNIT/ML Solostar Pen, Inject 48 Units into the skin daily at 12 noon., Disp: 18 mL, Rfl: 0   insulin lispro (HUMALOG KWIKPEN) 100 UNIT/ML KwikPen, CHECK FSBS PRIOR TO MEALS. HOLD  IF BELOW 110. 110-140 GIVE 4  UNITS. 140 TO 180 GIVE 6 UNITS.  180 TO 220 GIVE 8 UNITS. IF  GLUCOSE ABOVE 220 GIVE 10 UNITS., Disp: 30 mL, Rfl: 1   metFORMIN (GLUCOPHAGE-XR) 750 MG 24 hr tablet, Take 2 tablets (1,500 mg total) by mouth daily with breakfast., Disp: 180 tablet, Rfl: 1   pregabalin (LYRICA) 75 MG capsule, Take 1 capsule (75 mg total) by mouth 3 (three) times daily., Disp: 270 capsule, Rfl: 1   sertraline (ZOLOFT) 100 MG tablet, Take 1 tablet  (100 mg total) by mouth daily., Disp: 90 tablet, Rfl: 1   temazepam (RESTORIL) 30 MG capsule, Take 1 capsule (30 mg total) by mouth daily., Disp: 90 capsule, Rfl: 0   tirzepatide (MOUNJARO) 7.5 MG/0.5ML Pen, Inject 7.5 mg into the skin once a week., Disp: 6 mL, Rfl: 0   valsartan (DIOVAN) 40 MG tablet, Take 1 tablet (40 mg total) by mouth daily., Disp: 1 tablet, Rfl: 0  Allergies  Allergen Reactions   Ace Inhibitors     cough   Bee Venom     Bee   Dapagliflozin     hematuria   Ozempic (0.25 Or 0.5 Mg-Dose) [Semaglutide(0.25 Or 0.5mg -Dos)]     Bloating    Actos [Pioglitazone] Rash    I personally reviewed active problem list, medication list, allergies, family history, social history, health maintenance with the patient/caregiver today.   ROS  Ten systems reviewed and is negative except as mentioned in HPI    Objective  Vitals:   04/17/23 1114  BP: 112/74  Pulse: 96  Resp: 16  Temp: 98 F (36.7 C)  TempSrc: Oral  SpO2: 95%  Weight: 208 lb 4.8 oz (94.5 kg)  Height: 5\' 7"  (1.702 m)    Body mass index is 32.62 kg/m.  Physical Exam  Constitutional: Patient appears well-developed and well-nourished. Obese  No distress.  HEENT: head atraumatic, normocephalic, pupils equal and reactive to light, neck supple Cardiovascular: Normal rate, regular rhythm and normal heart sounds.  No murmur heard. No BLE edema. Pulmonary/Chest: Effort normal and breath sounds normal. No respiratory distress. Abdominal: Soft.  There is no tenderness. Psychiatric: Patient has a normal mood and affect. behavior is normal. Judgment and thought content normal.   Recent Results (from the past 2160 hour(s))  POCT HgB A1C     Status: Abnormal   Collection Time: 04/17/23 11:15 AM  Result Value Ref Range   Hemoglobin A1C 6.1 (  A) 4.0 - 5.6 %   HbA1c POC (<> result, manual entry)     HbA1c, POC (prediabetic range)     HbA1c, POC (controlled diabetic range)      Diabetic Foot Exam: Diabetic Foot  Exam - Simple   Simple Foot Form Visual Inspection No deformities, no ulcerations, no other skin breakdown bilaterally: Yes Sensation Testing Intact to touch and monofilament testing bilaterally: Yes Pulse Check Posterior Tibialis and Dorsalis pulse intact bilaterally: Yes Comments      PHQ2/9:    04/17/2023   11:13 AM 01/14/2023    7:58 AM 10/11/2022    8:17 AM 07/02/2022   10:00 AM 06/28/2022    8:41 AM  Depression screen PHQ 2/9  Decreased Interest 1 1 1  0 1  Down, Depressed, Hopeless 0 0 1 0 1  PHQ - 2 Score 1 1 2  0 2  Altered sleeping 0 0 0 0 0  Tired, decreased energy 0 0 0 0 0  Change in appetite 0 0 0 0 0  Feeling bad or failure about yourself  0 0 0 0 0  Trouble concentrating 0 0 0 0 0  Moving slowly or fidgety/restless 0 0 0 0 0  Suicidal thoughts 0 0 0 0 0  PHQ-9 Score 1 1 2  0 2  Difficult doing work/chores Not difficult at all Not difficult at all   Not difficult at all    phq 9 is negative    Fall Risk:    04/17/2023   11:10 AM 01/14/2023    7:46 AM 10/11/2022    8:17 AM 07/02/2022   10:00 AM 06/28/2022    8:38 AM  Fall Risk   Falls in the past year? 0 1 1 0 0  Number falls in past yr:  1 0 0   Injury with Fall?  0 0 0   Risk for fall due to : No Fall Risks History of fall(s) No Fall Risks No Fall Risks No Fall Risks  Follow up Falls prevention discussed Falls prevention discussed;Education provided;Falls evaluation completed Falls prevention discussed Falls prevention discussed Falls prevention discussed;Education provided;Falls evaluation completed      Functional Status Survey: Is the patient deaf or have difficulty hearing?: No Does the patient have difficulty seeing, even when wearing glasses/contacts?: Yes Does the patient have difficulty concentrating, remembering, or making decisions?: No Does the patient have difficulty walking or climbing stairs?: No Does the patient have difficulty dressing or bathing?: No Does the patient have difficulty  doing errands alone such as visiting a doctor's office or shopping?: No    Assessment & Plan  1. Dyslipidemia associated with type 2 diabetes mellitus (HCC)  - POCT HgB A1C - Urine Microalbumin w/creat. ratio - COMPLETE METABOLIC PANEL WITH GFR - HM Diabetes Foot Exam - tirzepatide (MOUNJARO) 7.5 MG/0.5ML Pen; Inject 7.5 mg into the skin once a week.  Dispense: 6 mL; Refill: 0 - insulin glargine, 2 Unit Dial, (TOUJEO MAX SOLOSTAR) 300 UNIT/ML Solostar Pen; Inject 48 Units into the skin daily at 12 noon.  Dispense: 18 mL; Refill: 0 - Lipid panel  2. Need for immunization against influenza  - Flu vaccine trivalent PF, 6mos and older(Flulaval,Afluria,Fluarix,Fluzone)  3. Benign hypertension  - valsartan (DIOVAN) 40 MG tablet; Take 1 tablet (40 mg total) by mouth daily.  Dispense: 1 tablet; Refill: 0  4. Hypertension associated with diabetes (HCC)  - valsartan (DIOVAN) 40 MG tablet; Take 1 tablet (40 mg total) by mouth daily.  Dispense: 1  tablet; Refill: 0 - CBC with Differential/Platelet  5. Diabetic polyneuropathy associated with type 2 diabetes mellitus (HCC)  - metFORMIN (GLUCOPHAGE-XR) 750 MG 24 hr tablet; Take 2 tablets (1,500 mg total) by mouth daily with breakfast.  Dispense: 180 tablet; Refill: 1 - tirzepatide (MOUNJARO) 7.5 MG/0.5ML Pen; Inject 7.5 mg into the skin once a week.  Dispense: 6 mL; Refill: 0  6. Lumbar back pain with radiculopathy affecting right lower extremity  - pregabalin (LYRICA) 75 MG capsule; Take 1 capsule (75 mg total) by mouth 3 (three) times daily.  Dispense: 270 capsule; Refill: 1  7. Menopausal symptom  - sertraline (ZOLOFT) 100 MG tablet; Take 1 tablet (100 mg total) by mouth daily.  Dispense: 90 tablet; Refill: 1  8. Insomnia, persistent  - temazepam (RESTORIL) 30 MG capsule; Take 1 capsule (30 mg total) by mouth daily.  Dispense: 90 capsule; Refill: 0  9. Mild recurrent major depression (HCC)  - ARIPiprazole (ABILIFY) 2 MG tablet;  Take 1 tablet (2 mg total) by mouth daily.  Dispense: 90 tablet; Refill: 1  10. Vitamin D deficiency  - VITAMIN D 25 Hydroxy (Vit-D Deficiency, Fractures)  11. B12 deficiency  - B12 and Folate Panel

## 2023-04-17 ENCOUNTER — Encounter: Payer: Self-pay | Admitting: Family Medicine

## 2023-04-17 ENCOUNTER — Ambulatory Visit: Payer: Managed Care, Other (non HMO) | Admitting: Family Medicine

## 2023-04-17 VITALS — BP 112/74 | HR 96 | Temp 98.0°F | Resp 16 | Ht 67.0 in | Wt 208.3 lb

## 2023-04-17 DIAGNOSIS — I10 Essential (primary) hypertension: Secondary | ICD-10-CM | POA: Diagnosis not present

## 2023-04-17 DIAGNOSIS — E538 Deficiency of other specified B group vitamins: Secondary | ICD-10-CM | POA: Diagnosis not present

## 2023-04-17 DIAGNOSIS — N951 Menopausal and female climacteric states: Secondary | ICD-10-CM

## 2023-04-17 DIAGNOSIS — I152 Hypertension secondary to endocrine disorders: Secondary | ICD-10-CM

## 2023-04-17 DIAGNOSIS — Z23 Encounter for immunization: Secondary | ICD-10-CM

## 2023-04-17 DIAGNOSIS — E1159 Type 2 diabetes mellitus with other circulatory complications: Secondary | ICD-10-CM | POA: Diagnosis not present

## 2023-04-17 DIAGNOSIS — F330 Major depressive disorder, recurrent, mild: Secondary | ICD-10-CM

## 2023-04-17 DIAGNOSIS — E1169 Type 2 diabetes mellitus with other specified complication: Secondary | ICD-10-CM

## 2023-04-17 DIAGNOSIS — E1142 Type 2 diabetes mellitus with diabetic polyneuropathy: Secondary | ICD-10-CM

## 2023-04-17 DIAGNOSIS — M5416 Radiculopathy, lumbar region: Secondary | ICD-10-CM

## 2023-04-17 DIAGNOSIS — G4700 Insomnia, unspecified: Secondary | ICD-10-CM

## 2023-04-17 DIAGNOSIS — E559 Vitamin D deficiency, unspecified: Secondary | ICD-10-CM

## 2023-04-17 LAB — POCT GLYCOSYLATED HEMOGLOBIN (HGB A1C): Hemoglobin A1C: 6.1 % — AB (ref 4.0–5.6)

## 2023-04-17 MED ORDER — METFORMIN HCL ER 750 MG PO TB24
1500.0000 mg | ORAL_TABLET | Freq: Every day | ORAL | 1 refills | Status: DC
Start: 2023-04-17 — End: 2023-11-24

## 2023-04-17 MED ORDER — TEMAZEPAM 30 MG PO CAPS
30.0000 mg | ORAL_CAPSULE | Freq: Every day | ORAL | 0 refills | Status: DC
Start: 2023-04-17 — End: 2023-07-01

## 2023-04-17 MED ORDER — PREGABALIN 75 MG PO CAPS
75.0000 mg | ORAL_CAPSULE | Freq: Three times a day (TID) | ORAL | 1 refills | Status: DC
Start: 2023-04-17 — End: 2023-07-01

## 2023-04-17 MED ORDER — TOUJEO MAX SOLOSTAR 300 UNIT/ML ~~LOC~~ SOPN
48.0000 [IU] | PEN_INJECTOR | Freq: Every day | SUBCUTANEOUS | 0 refills | Status: DC
Start: 2023-04-17 — End: 2023-07-01

## 2023-04-17 MED ORDER — SERTRALINE HCL 100 MG PO TABS
100.0000 mg | ORAL_TABLET | Freq: Every day | ORAL | 1 refills | Status: DC
Start: 2023-04-17 — End: 2023-10-03

## 2023-04-17 MED ORDER — INSULIN LISPRO (1 UNIT DIAL) 100 UNIT/ML (KWIKPEN): PEN_INJECTOR | SUBCUTANEOUS | 1 refills | Status: DC

## 2023-04-17 MED ORDER — MOUNJARO 7.5 MG/0.5ML ~~LOC~~ SOAJ
7.5000 mg | SUBCUTANEOUS | 0 refills | Status: DC
Start: 2023-04-17 — End: 2023-06-27

## 2023-04-17 MED ORDER — ARIPIPRAZOLE 2 MG PO TABS
2.0000 mg | ORAL_TABLET | Freq: Every day | ORAL | 1 refills | Status: DC
Start: 2023-04-17 — End: 2023-10-03

## 2023-04-17 MED ORDER — CYANOCOBALAMIN 1000 MCG/ML IJ SOLN
1000.0000 ug | Freq: Once | INTRAMUSCULAR | Status: AC
Start: 2023-04-17 — End: 2023-04-17
  Administered 2023-04-17: 1000 ug via INTRAMUSCULAR

## 2023-04-17 MED ORDER — VALSARTAN 40 MG PO TABS
40.0000 mg | ORAL_TABLET | Freq: Every day | ORAL | 0 refills | Status: DC
Start: 2023-04-17 — End: 2023-10-03

## 2023-04-18 LAB — COMPLETE METABOLIC PANEL WITH GFR
AG Ratio: 1.5 (calc) (ref 1.0–2.5)
ALT: 15 U/L (ref 6–29)
AST: 22 U/L (ref 10–35)
Albumin: 4.3 g/dL (ref 3.6–5.1)
Alkaline phosphatase (APISO): 104 U/L (ref 37–153)
BUN/Creatinine Ratio: 14 (calc) (ref 6–22)
BUN: 16 mg/dL (ref 7–25)
CO2: 25 mmol/L (ref 20–32)
Calcium: 9.7 mg/dL (ref 8.6–10.4)
Chloride: 104 mmol/L (ref 98–110)
Creat: 1.11 mg/dL — ABNORMAL HIGH (ref 0.50–1.05)
Globulin: 2.8 g/dL (calc) (ref 1.9–3.7)
Glucose, Bld: 106 mg/dL — ABNORMAL HIGH (ref 65–99)
Potassium: 5 mmol/L (ref 3.5–5.3)
Sodium: 140 mmol/L (ref 135–146)
Total Bilirubin: 0.3 mg/dL (ref 0.2–1.2)
Total Protein: 7.1 g/dL (ref 6.1–8.1)
eGFR: 57 mL/min/{1.73_m2} — ABNORMAL LOW (ref >=60)

## 2023-04-18 LAB — LIPID PANEL
Cholesterol: 115 mg/dL (ref <200)
HDL: 52 mg/dL (ref >=50)
LDL Cholesterol (Calc): 45 mg/dL (calc)
Non-HDL Cholesterol (Calc): 63 mg/dL (calc) (ref <130)
Total CHOL/HDL Ratio: 2.2 (calc) (ref <5.0)
Triglycerides: 94 mg/dL (ref <150)

## 2023-04-18 LAB — CBC WITH DIFFERENTIAL/PLATELET
Absolute Monocytes: 707 cells/uL (ref 200–950)
Basophils Absolute: 10 cells/uL (ref 0–200)
Basophils Relative: 0.1 %
Eosinophils Absolute: 152 cells/uL (ref 15–500)
Eosinophils Relative: 1.5 %
HCT: 39.3 % (ref 35.0–45.0)
Hemoglobin: 12.5 g/dL (ref 11.7–15.5)
Lymphs Abs: 1414 cells/uL (ref 850–3900)
MCH: 25.9 pg — ABNORMAL LOW (ref 27.0–33.0)
MCHC: 31.8 g/dL — ABNORMAL LOW (ref 32.0–36.0)
MCV: 81.5 fL (ref 80.0–100.0)
MPV: 8.6 fL (ref 7.5–12.5)
Monocytes Relative: 7 %
Neutro Abs: 7817 cells/uL — ABNORMAL HIGH (ref 1500–7800)
Neutrophils Relative %: 77.4 %
Platelets: 317 10*3/uL (ref 140–400)
RBC: 4.82 10*6/uL (ref 3.80–5.10)
RDW: 14.6 % (ref 11.0–15.0)
Total Lymphocyte: 14 %
WBC: 10.1 10*3/uL (ref 3.8–10.8)

## 2023-04-18 LAB — VITAMIN D 25 HYDROXY (VIT D DEFICIENCY, FRACTURES): Vit D, 25-Hydroxy: 49 ng/mL (ref 30–100)

## 2023-04-18 LAB — MICROALBUMIN / CREATININE URINE RATIO
Creatinine, Urine: 157 mg/dL (ref 20–275)
Microalb Creat Ratio: 34 mg/g creat — ABNORMAL HIGH (ref <30)
Microalb, Ur: 5.3 mg/dL

## 2023-04-18 LAB — B12 AND FOLATE PANEL
Folate: 5.8 ng/mL
Vitamin B-12: 248 pg/mL (ref 200–1100)

## 2023-05-11 ENCOUNTER — Other Ambulatory Visit: Payer: Self-pay | Admitting: Family Medicine

## 2023-05-11 DIAGNOSIS — E1165 Type 2 diabetes mellitus with hyperglycemia: Secondary | ICD-10-CM

## 2023-05-11 DIAGNOSIS — E1169 Type 2 diabetes mellitus with other specified complication: Secondary | ICD-10-CM

## 2023-05-11 DIAGNOSIS — G4700 Insomnia, unspecified: Secondary | ICD-10-CM

## 2023-06-12 ENCOUNTER — Other Ambulatory Visit: Payer: Self-pay | Admitting: Family Medicine

## 2023-06-12 DIAGNOSIS — K219 Gastro-esophageal reflux disease without esophagitis: Secondary | ICD-10-CM

## 2023-06-19 ENCOUNTER — Ambulatory Visit
Admission: RE | Admit: 2023-06-19 | Discharge: 2023-06-19 | Disposition: A | Discharge status: Home / Self Care | Payer: Managed Care, Other (non HMO) | Source: Ambulatory Visit | Attending: Family Medicine | Admitting: Family Medicine

## 2023-06-19 DIAGNOSIS — Z1231 Encounter for screening mammogram for malignant neoplasm of breast: Secondary | ICD-10-CM | POA: Diagnosis present

## 2023-06-19 LAB — HM DIABETES EYE EXAM

## 2023-06-26 ENCOUNTER — Other Ambulatory Visit: Payer: Self-pay | Admitting: Family Medicine

## 2023-06-26 DIAGNOSIS — E1142 Type 2 diabetes mellitus with diabetic polyneuropathy: Secondary | ICD-10-CM

## 2023-06-26 DIAGNOSIS — E1169 Type 2 diabetes mellitus with other specified complication: Secondary | ICD-10-CM

## 2023-07-01 ENCOUNTER — Ambulatory Visit: Payer: Managed Care, Other (non HMO) | Admitting: Family Medicine

## 2023-07-01 ENCOUNTER — Encounter: Payer: Self-pay | Admitting: Family Medicine

## 2023-07-01 VITALS — BP 128/70 | HR 95 | Temp 98.0°F | Resp 16 | Ht 67.0 in | Wt 207.7 lb

## 2023-07-01 DIAGNOSIS — M5416 Radiculopathy, lumbar region: Secondary | ICD-10-CM

## 2023-07-01 DIAGNOSIS — E1129 Type 2 diabetes mellitus with other diabetic kidney complication: Secondary | ICD-10-CM

## 2023-07-01 DIAGNOSIS — I10 Essential (primary) hypertension: Secondary | ICD-10-CM | POA: Diagnosis not present

## 2023-07-01 DIAGNOSIS — G4700 Insomnia, unspecified: Secondary | ICD-10-CM

## 2023-07-01 DIAGNOSIS — R809 Proteinuria, unspecified: Secondary | ICD-10-CM

## 2023-07-01 DIAGNOSIS — E559 Vitamin D deficiency, unspecified: Secondary | ICD-10-CM

## 2023-07-01 DIAGNOSIS — E1169 Type 2 diabetes mellitus with other specified complication: Secondary | ICD-10-CM

## 2023-07-01 DIAGNOSIS — E66811 Obesity, class 1: Secondary | ICD-10-CM | POA: Diagnosis not present

## 2023-07-01 DIAGNOSIS — G4733 Obstructive sleep apnea (adult) (pediatric): Secondary | ICD-10-CM

## 2023-07-01 DIAGNOSIS — E538 Deficiency of other specified B group vitamins: Secondary | ICD-10-CM

## 2023-07-01 DIAGNOSIS — E785 Hyperlipidemia, unspecified: Secondary | ICD-10-CM

## 2023-07-01 DIAGNOSIS — E1142 Type 2 diabetes mellitus with diabetic polyneuropathy: Secondary | ICD-10-CM

## 2023-07-01 DIAGNOSIS — F325 Major depressive disorder, single episode, in full remission: Secondary | ICD-10-CM

## 2023-07-01 MED ORDER — DEXCOM G7 SENSOR MISC: 1.0000 | 3 refills | Status: DC

## 2023-07-01 MED ORDER — TOUJEO MAX SOLOSTAR 300 UNIT/ML ~~LOC~~ SOPN: 48.0000 [IU] | PEN_INJECTOR | Freq: Every day | SUBCUTANEOUS | 0 refills | Status: DC

## 2023-07-01 MED ORDER — MOUNJARO 7.5 MG/0.5ML ~~LOC~~ SOAJ
7.5000 mg | SUBCUTANEOUS | 0 refills | Status: DC
Start: 2023-07-01 — End: 2023-10-03

## 2023-07-01 MED ORDER — PREGABALIN 75 MG PO CAPS: 75.0000 mg | ORAL_CAPSULE | Freq: Two times a day (BID) | ORAL | 1 refills | Status: DC

## 2023-07-01 MED ORDER — TEMAZEPAM 30 MG PO CAPS
30.0000 mg | ORAL_CAPSULE | Freq: Every day | ORAL | 0 refills | Status: DC
Start: 2023-07-01 — End: 2023-10-03

## 2023-07-01 NOTE — Progress Notes (Signed)
Name: Joyce Blackburn   MRN: [id number]    DOB: [birth year]   Date:07/01/2023       Progress Note  Subjective  Chief Complaint  Chief Complaint  Patient presents with   Medical Management of Chronic Issues    HPI  Discussed the use of AI scribe software for clinical note transcription with the patient, who gave verbal consent to proceed.  History of Present Illness   The patient, with a history of diabetes, dyslipidemia, and microalbuminuria, has been managing her conditions well. Her last A1c in September was 6.1, and her bad cholesterol was down to 45. She has been taking valsartan for kidney protection and has previously tried SGL-2 agonist but it caused hematuria and Pioglitazone caused a rash. She is currently on Humalog before meals and Toujeo, with a dose of 48 units. She also takes metformin twice a day and Mounjaro 7.5 mg  once a week. Her fasting blood sugar ranges between 97 and 100, and postprandial sugar can go up to 240-250 if she consumes foods like potatoes. She adjusts her medication accordingly when consuming such foods.   The patient also has a history of GERD  and experiences occasional nausea depending on her diet. She is on pantoprazole for this. She has a history of migraines but has not had any recent episodes. She also has a history of sciatica, which is managed with pregabalin. She has been doing home exercises for this and has not seen a chiropractor recently.  The patient has been diagnosed with major depression disorder, which is currently in remission. She is on Zoloft and Abilify for mood management. She also has obstructive sleep apnea and uses a CPAP machine every night. She has been taking a half dose of valsartan half of 80 mg dose  due to low blood pressure and today bp is at goal .  The patient's B12 was low at her last visit, and she has been taking a supplement for this. Her vitamin D levels were normal. She also has a history of fatty liver and has had a  normal HIDA scan. She was previously on Dexilant for heartburn and indigestion, which is now managed with pantoprazole. She also has a history of recurrent low back pain and has been taking pregabalin for numbness in her right leg. She has not taken any prednisone recently. She also has allergies and is on montelukast for this. She is also on rosuvastatin for dyslipidemia. She uses a Dexcom device to monitor her blood sugar levels.          Dexcom  Patient Active Problem List   Diagnosis Date Noted   Obesity (BMI 30.0-34.9) 01/14/2023   OSA (obstructive sleep apnea) 10/11/2022   Mild recurrent major depression (HCC) 12/26/2021   Lumbar back pain with radiculopathy affecting right lower extremity 12/26/2021   Sciatic nerve pain, right 12/26/2021   Major depression in remission (HCC) 12/19/2017   Fatty liver 03/04/2016   Vitamin D deficiency 03/04/2016   Gastroesophageal reflux disease without esophagitis 07/04/2015   Diabetic polyneuropathy associated with type 2 diabetes mellitus (HCC) 03/03/2015   Peripheral neuropathy 03/03/2015   Allergic to bees 02/20/2015   Benign hypertension 02/20/2015   Insomnia, persistent 02/20/2015   Chronic idiopathic constipation 02/20/2015   Dyslipidemia 02/20/2015   Dermatitis, eczematoid 02/20/2015   H/O gastric ulcer 02/20/2015   Herpes 02/20/2015   Migraine without aura and without status migrainosus, not intractable 02/20/2015   Morbid obesity (HCC) 02/20/2015   Paresthesia of arm  02/20/2015   Periodic limb movement 02/20/2015   Allergic rhinitis, seasonal 02/20/2015   Menopausal symptom 02/20/2015    Past Surgical History:  Procedure Laterality Date   ABDOMINAL HYSTERECTOMY     COLONOSCOPY WITH PROPOFOL N/A 05/26/2020   Procedure: COLONOSCOPY WITH PROPOFOL;  Surgeon: Wyline Mood, MD;  Location: [facility] ENDOSCOPY;  Service: Gastroenterology;  Laterality: N/A;   RETINAL TEAR REPAIR CRYOTHERAPY Right    [facility] (Dr. Robin Searing)    Family  History  Problem Relation Age of Onset   Mental illness Mother    Diabetes Mother    CVA Mother    Schizophrenia Mother    Diabetes Sister    Hypertension Sister    Diabetes Brother    Mental illness Brother    Bipolar disorder Brother    Schizophrenia Brother     Social History   Tobacco Use   Smoking status: Former    Current packs/day: 0.00    Average packs/day: 0.1 packs/day for 15.0 years (1.5 ttl pk-yrs)    Types: Cigarettes    Start date: 07/29/1989    Quit date: 07/29/2004    Years since quitting: 18.9   Smokeless tobacco: Never  Substance Use Topics   Alcohol use: No    Alcohol/week: 0.0 standard drinks of alcohol     Current Outpatient Medications:    Alcohol Swabs (ALCOHOL PREP) 70 % PADS, USE 3 TIMES DAILY AS NEEDED, Disp: 300 each, Rfl: 1   ARIPiprazole (ABILIFY) 2 MG tablet, Take 1 tablet (2 mg total) by mouth daily., Disp: 90 tablet, Rfl: 1   aspirin 81 MG tablet, Take 81 mg by mouth daily., Disp: , Rfl:    Cholecalciferol (VITAMIN D3) 1.25 MG (50000 UT) capsule, Take 1 capsule (50,000 Units total) by mouth every 7 (seven) days., Disp: 12 capsule, Rfl: 1   Continuous Glucose Sensor (DEXCOM G7 SENSOR) MISC, USE AS DIRECTED, Disp: 9 each, Rfl: 3   EPINEPHrine (EPIPEN 2-PAK) 0.3 mg/0.3 mL IJ SOAJ injection, Inject 0.3 mg into the muscle as needed for anaphylaxis., Disp: 2 each, Rfl: 0   Glucagon (GVOKE HYPOPEN 1-PACK) 1 MG/0.2ML SOAJ, Inject 1 mg into the skin as needed (for hypoglycemic episode)., Disp: 0.2 mL, Rfl: 3   glucose blood (CONTOUR NEXT TEST) test strip, USE AS DIRECTED ONCE DAILY, Disp: 100 strip, Rfl: 3   insulin glargine, 2 Unit Dial, (TOUJEO MAX SOLOSTAR) 300 UNIT/ML Solostar Pen, Inject 48 Units into the skin daily at 12 noon., Disp: 18 mL, Rfl: 0   insulin lispro (HUMALOG KWIKPEN) 100 UNIT/ML KwikPen, CHECK FSBS PRIOR TO MEALS. HOLD  IF BELOW 110. 110-140 GIVE 4  UNITS. 140 TO 180 GIVE 6 UNITS.  180 TO 220 GIVE 8 UNITS. IF  GLUCOSE ABOVE 220 GIVE 10  UNITS., Disp: 30 mL, Rfl: 1   loratadine (CLARITIN) 10 MG tablet, Take 1 tablet (10 mg total) by mouth daily., Disp: 30 tablet, Rfl: 11   metFORMIN (GLUCOPHAGE-XR) 750 MG 24 hr tablet, Take 2 tablets (1,500 mg total) by mouth daily with breakfast., Disp: 180 tablet, Rfl: 1   montelukast (SINGULAIR) 10 MG tablet, TAKE 1 TABLET BY MOUTH AT  BEDTIME, Disp: 90 tablet, Rfl: 3   NOVOFINE PEN NEEDLE 32G X 6 MM MISC, USE TO INJECT INTO THE SKIN  TWICE DAILY, Disp: 180 each, Rfl: 0   pantoprazole (PROTONIX) 40 MG tablet, TAKE 1 TABLET BY MOUTH DAILY, Disp: 90 tablet, Rfl: 1   pregabalin (LYRICA) 75 MG capsule, Take 1 capsule (75 mg  total) by mouth 3 (three) times daily., Disp: 270 capsule, Rfl: 1   rosuvastatin (CRESTOR) 40 MG tablet, Daily for cholesterol, Disp: 90 tablet, Rfl: 3   sertraline (ZOLOFT) 100 MG tablet, Take 1 tablet (100 mg total) by mouth daily., Disp: 90 tablet, Rfl: 1   temazepam (RESTORIL) 30 MG capsule, Take 1 capsule (30 mg total) by mouth daily., Disp: 90 capsule, Rfl: 0   tirzepatide (MOUNJARO) 7.5 MG/0.5ML Pen, INJECT THE CONTENTS OF ONE PEN  SUBCUTANEOUSLY WEEKLY AS  DIRECTED, Disp: 2 mL, Rfl: 0   triamcinolone ointment (KENALOG) 0.5 %, Apply 1 application topically 2 (two) times daily., Disp: 60 g, Rfl: 0   valACYclovir (VALTREX) 1000 MG tablet, Take 1 tablet (1,000 mg total) by mouth 2 (two) times daily. Prn, Disp: 60 tablet, Rfl: 0   valsartan (DIOVAN) 40 MG tablet, Take 1 tablet (40 mg total) by mouth daily., Disp: 1 tablet, Rfl: 0  Allergies  Allergen Reactions   Ace Inhibitors     cough   Bee Venom     Bee   Dapagliflozin     hematuria   Ozempic (0.25 Or 0.5 Mg-Dose) [Semaglutide(0.25 Or 0.5mg -Dos)]     Bloating    Actos [Pioglitazone] Rash    I personally reviewed active problem list, medication list, allergies, family history with the patient/caregiver today.   ROS  Ten systems reviewed and is negative except as mentioned in HPI    Objective  Vitals:    07/01/23 1321  BP: 128/70  Pulse: 95  Resp: 16  Temp: 98 F (36.7 C)  TempSrc: Oral  SpO2: 96%  Weight: 207 lb 11.2 oz (94.2 kg)  Height: 5\' 7"  (1.702 m)    Body mass index is 32.53 kg/m.  Physical Exam  Constitutional: Patient appears well-developed and well-nourished. Obese  No distress.  HEENT: head atraumatic, normocephalic, pupils equal and reactive to light, neck supple Cardiovascular: Normal rate, regular rhythm and normal heart sounds.  No murmur heard. No BLE edema. Pulmonary/Chest: Effort normal and breath sounds normal. No respiratory distress. Abdominal: Soft.  There is no tenderness. Psychiatric: Patient has a normal mood and affect. behavior is normal. Judgment and thought content normal.   Recent Results (from the past 2160 hour(s))  POCT HgB A1C     Status: Abnormal   Collection Time: 04/17/23 11:15 AM  Result Value Ref Range   Hemoglobin A1C 6.1 (A) 4.0 - 5.6 %   HbA1c POC (<> result, manual entry)     HbA1c, POC (prediabetic range)     HbA1c, POC (controlled diabetic range)    Urine Microalbumin w/creat. ratio     Status: Abnormal   Collection Time: 04/17/23 11:56 AM  Result Value Ref Range   Creatinine, Urine 157 20 - 275 mg/dL   Microalb, Ur 5.3 mg/dL    Comment: Reference Range Not established    Microalb Creat Ratio 34 (H) <30 mg/g creat    Comment: . The ADA defines abnormalities in albumin excretion as follows: Marland Kitchen Albuminuria Category        Result (mg/g creatinine) . Normal to Mildly increased   <30 Moderately increased         30-299  Severely increased           > OR = 300 . The ADA recommends that at least two of three specimens collected within a 3-6 month period be abnormal before considering a patient to be within a diagnostic category.   COMPLETE METABOLIC PANEL WITH GFR  Status: Abnormal   Collection Time: 04/17/23 11:56 AM  Result Value Ref Range   Glucose, Bld 106 (H) 65 - 99 mg/dL    Comment: .            Fasting  reference interval . For someone without known diabetes, a glucose value between 100 and 125 mg/dL is consistent with prediabetes and should be confirmed with a follow-up test. .    BUN 16 7 - 25 mg/dL   Creat 1.61 (H) 0.96 - 1.05 mg/dL   eGFR 57 (L) > OR = 60 mL/min/1.71m2   BUN/Creatinine Ratio 14 6 - 22 (calc)   Sodium 140 135 - 146 mmol/L   Potassium 5.0 3.5 - 5.3 mmol/L   Chloride 104 98 - 110 mmol/L   CO2 25 20 - 32 mmol/L   Calcium 9.7 8.6 - 10.4 mg/dL   Total Protein 7.1 6.1 - 8.1 g/dL   Albumin 4.3 3.6 - 5.1 g/dL   Globulin 2.8 1.9 - 3.7 g/dL (calc)   AG Ratio 1.5 1.0 - 2.5 (calc)   Total Bilirubin 0.3 0.2 - 1.2 mg/dL   Alkaline phosphatase (APISO) 104 37 - 153 U/L   AST 22 10 - 35 U/L   ALT 15 6 - 29 U/L  Lipid panel     Status: None   Collection Time: 04/17/23 11:56 AM  Result Value Ref Range   Cholesterol 115 <200 mg/dL   HDL 52 > OR = 50 mg/dL   Triglycerides 94 <045 mg/dL   LDL Cholesterol (Calc) 45 mg/dL (calc)    Comment: Reference range: <100 . Desirable range <100 mg/dL for primary prevention;   <70 mg/dL for patients with CHD or diabetic patients  with > or = 2 CHD risk factors. Marland Kitchen LDL-C is now calculated using the Martin-Hopkins  calculation, which is a validated novel method providing  better accuracy than the Friedewald equation in the  estimation of LDL-C.  Horald Pollen et al. Lenox Ahr. 4098;119(14): 2061-2068  (http://education.[facility].com/faq/FAQ164)    Total CHOL/HDL Ratio 2.2 <5.0 (calc)   Non-HDL Cholesterol (Calc) 63 <782 mg/dL (calc)    Comment: For patients with diabetes plus 1 major ASCVD risk  factor, treating to a non-HDL-C goal of <100 mg/dL  (LDL-C of <95 mg/dL) is considered a therapeutic  option.   CBC with Differential/Platelet     Status: Abnormal   Collection Time: 04/17/23 11:56 AM  Result Value Ref Range   WBC 10.1 3.8 - 10.8 Thousand/uL   RBC 4.82 3.80 - 5.10 Million/uL   Hemoglobin 12.5 11.7 - 15.5 g/dL   HCT 62.1  30.8 - 65.7 %   MCV 81.5 80.0 - 100.0 fL   MCH 25.9 (L) 27.0 - 33.0 pg   MCHC 31.8 (L) 32.0 - 36.0 g/dL   RDW 84.6 96.2 - 95.2 %   Platelets 317 140 - 400 Thousand/uL   MPV 8.6 7.5 - 12.5 fL   Neutro Abs 7,817 (H) 1,500 - 7,800 cells/uL   Lymphs Abs 1,414 850 - 3,900 cells/uL   Absolute Monocytes 707 200 - 950 cells/uL   Eosinophils Absolute 152 15 - 500 cells/uL   Basophils Absolute 10 0 - 200 cells/uL   Neutrophils Relative % 77.4 %   Total Lymphocyte 14.0 %   Monocytes Relative 7.0 %   Eosinophils Relative 1.5 %   Basophils Relative 0.1 %  VITAMIN D 25 Hydroxy (Vit-D Deficiency, Fractures)     Status: None   Collection Time: 04/17/23 11:56 AM  Result Value Ref Range   Vit D, 25-Hydroxy 49 30 - 100 ng/mL    Comment: Vitamin D Status         25-OH Vitamin D: . Deficiency:                    <20 ng/mL Insufficiency:             20 - 29 ng/mL Optimal:                 > or = 30 ng/mL . For 25-OH Vitamin D testing on patients on  D2-supplementation and patients for whom quantitation  of D2 and D3 fractions is required, the [facility]ure[facility](TM) 25-OH VIT D, (D2,D3), LC/MS/MS is recommended: order  code 40981 (patients >60yrs). . See Note 1 . Note 1 . For additional information, please refer to  http://education.[facility].com/faq/FAQ199  (This link is being provided for informational/ educational purposes only.)   B12 and Folate Panel     Status: None   Collection Time: 04/17/23 11:56 AM  Result Value Ref Range   Vitamin B-12 248 200 - 1,100 pg/mL    Comment: . Please Note: Although the reference range for vitamin B12 is [phone] pg/mL, it has been reported that between 5 and 10% of patients with values between 200 and 400 pg/mL may experience neuropsychiatric and hematologic abnormalities due to occult B12 deficiency; less than 1% of patients with values above 400 pg/mL will have symptoms. .    Folate 5.8 ng/mL    Comment:                            Reference  Range                            Low:           <3.4                            Borderline:    3.4-5.4                            Normal:        >5.4 .      PHQ2/9:    07/01/2023    1:20 PM 04/17/2023   11:13 AM 01/14/2023    7:58 AM 10/11/2022    8:17 AM 07/02/2022   10:00 AM  Depression screen PHQ 2/9  Decreased Interest 0 1 1 1  0  Down, Depressed, Hopeless 0 0 0 1 0  PHQ - 2 Score 0 1 1 2  0  Altered sleeping 0 0 0 0 0  Tired, decreased energy 0 0 0 0 0  Change in appetite 0 0 0 0 0  Feeling bad or failure about yourself  0 0 0 0 0  Trouble concentrating 0 0 0 0 0  Moving slowly or fidgety/restless 0 0 0 0 0  Suicidal thoughts 0 0 0 0 0  PHQ-9 Score 0 1 1 2  0  Difficult doing work/chores Not difficult at all Not difficult at all Not difficult at all      phq 9 is negative   Fall Risk:    07/01/2023    1:20 PM 04/17/2023   11:10 AM 01/14/2023    7:46 AM 10/11/2022    8:17 AM 07/02/2022  10:00 AM  Fall Risk   Falls in the past year? 0 0 1 1 0  Number falls in past yr: 0  1 0 0  Injury with Fall? 0  0 0 0  Risk for fall due to : No Fall Risks No Fall Risks History of fall(s) No Fall Risks No Fall Risks  Follow up Falls prevention discussed;Education provided;Falls evaluation completed Falls prevention discussed Falls prevention discussed;Education provided;Falls evaluation completed Falls prevention discussed Falls prevention discussed     Functional Status Survey: Is the patient deaf or have difficulty hearing?: No Does the patient have difficulty seeing, even when wearing glasses/contacts?: No Does the patient have difficulty concentrating, remembering, or making decisions?: No Does the patient have difficulty walking or climbing stairs?: No Does the patient have difficulty dressing or bathing?: No Does the patient have difficulty doing errands alone such as visiting a doctor's office or shopping?: No    Assessment & Plan  Assessment and Plan    Type 2  Diabetes Mellitus Well controlled with A1c of 6.1% in September. Patient is on Humalog before meals, Toujeo 48 units daily, Metformin 750mg  twice daily, and weekly Mounjaro. No symptoms of hyperglycemia. Fasting glucose between 97-100. Postprandial glucose can rise to 240-250 with high carbohydrate meals but comes down within an hour. -Continue current regimen. -Check A1c and comprehensive panel next week.  Microalbuminuria Associated with diabetes. Patient is on Valsartan for kidney protection. -Continue Valsartan. -Monitor kidney function with comprehensive panel next week.  Dyslipidemia Well controlled with Rosuvastatin. Last LDL was 45. -Continue Rosuvastatin.  Gastroesophageal Reflux Disease (GERD) Stable with Pantoprazole. Symptoms can flare with certain foods (e.g., spaghetti, spicy foods). -Continue Pantoprazole.  Sciatica Controlled with Pregabalin 75mg  twice daily. Severe pain when medication is missed. -Continue Pregabalin 75mg  twice daily.  Major Depressive Disorder In remission with Sertraline and Abilify. Patient reports feeling emotionally fine. -Continue Sertraline and Abilify.  Obstructive Sleep Apnea Patient is using CPAP every night. -Continue CPAP use.  Hypertension Well controlled with half dose of Valsartan 40mg  daily. -Continue half dose of Valsartan 80mg  daily. Consider switching to Losartan 25mg  when current supply runs out.  Vitamin B12 Deficiency Patient is on a supplement. -Continue B12 supplement.  General Health Maintenance -Continue Montelukast for allergies. -Continue Aspirin for cardiovascular protection. -Continue Temazepam for insomnia. -Check Vitamin D levels with comprehensive panel next week. -Continue monitoring blood glucose with Dexcom. -Follow up in 3 months.

## 2023-07-04 NOTE — Telephone Encounter (Signed)
HM is up to date

## 2023-08-20 ENCOUNTER — Other Ambulatory Visit: Payer: Self-pay | Admitting: Family Medicine

## 2023-08-20 DIAGNOSIS — J302 Other seasonal allergic rhinitis: Secondary | ICD-10-CM

## 2023-08-20 NOTE — Telephone Encounter (Signed)
Last f/u 06/2023

## 2023-08-27 ENCOUNTER — Telehealth: Payer: Managed Care, Other (non HMO) | Admitting: Physician Assistant

## 2023-08-27 DIAGNOSIS — J069 Acute upper respiratory infection, unspecified: Secondary | ICD-10-CM | POA: Diagnosis not present

## 2023-08-27 MED ORDER — FLUTICASONE PROPIONATE 50 MCG/ACT NA SUSP: 2.0000 | Freq: Every day | NASAL | 0 refills | Status: AC

## 2023-08-27 MED ORDER — BENZONATATE 100 MG PO CAPS: 100.0000 mg | ORAL_CAPSULE | Freq: Three times a day (TID) | ORAL | 0 refills | Status: DC | PRN

## 2023-08-27 NOTE — Progress Notes (Signed)

## 2023-09-02 ENCOUNTER — Other Ambulatory Visit: Payer: Self-pay | Admitting: Family Medicine

## 2023-09-02 DIAGNOSIS — E1169 Type 2 diabetes mellitus with other specified complication: Secondary | ICD-10-CM

## 2023-09-04 NOTE — Telephone Encounter (Signed)
 Requested Prescriptions  Pending Prescriptions Disp Refills   glucose blood (CONTOUR NEXT TEST) test strip [Pharmacy Med Name: Contour Next Test In Vitro Strip] 100 strip 1    Sig: USE AS DIRECTED ONCE DAILY     Endocrinology: Diabetes - Testing Supplies Passed - 09/04/2023 11:32 AM      Passed - Valid encounter within last 12 months    Recent Outpatient Visits           2 months ago Dyslipidemia associated with type 2 diabetes mellitus [facility])   [facility] [place], D[place]te, MD   4 months ago Dyslipidemia associated with type 2 diabetes mellitus [facility])   [facility] [place] D[place], MD   7 months ago Diabetic polyneuropathy associated with type 2 diabetes mellitus [facility])   [facility] [place] D[place], MD   10 months ago Diabetic polyneuropathy associated with type 2 diabetes mellitus [facility])   [facility] Sow[place], Krichna, MD   1 year ago Exposure to the flu   [facility] [place] D[place]te, MD       Future Appointments             In 4 weeks Sowles, Krichna, MD [facility], [facility]

## 2023-09-23 ENCOUNTER — Other Ambulatory Visit: Payer: Self-pay | Admitting: Family Medicine

## 2023-09-23 DIAGNOSIS — E1169 Type 2 diabetes mellitus with other specified complication: Secondary | ICD-10-CM

## 2023-09-24 NOTE — Telephone Encounter (Signed)
 Last f/u 06/2023

## 2023-10-03 ENCOUNTER — Ambulatory Visit (INDEPENDENT_AMBULATORY_CARE_PROVIDER_SITE_OTHER): Payer: Managed Care, Other (non HMO) | Admitting: Family Medicine

## 2023-10-03 ENCOUNTER — Encounter: Payer: Self-pay | Admitting: Family Medicine

## 2023-10-03 VITALS — BP 120/74 | HR 81 | Temp 98.1°F | Resp 16 | Ht 67.0 in | Wt 206.2 lb

## 2023-10-03 DIAGNOSIS — E1169 Type 2 diabetes mellitus with other specified complication: Secondary | ICD-10-CM | POA: Diagnosis not present

## 2023-10-03 DIAGNOSIS — E66811 Obesity, class 1: Secondary | ICD-10-CM

## 2023-10-03 DIAGNOSIS — E1159 Type 2 diabetes mellitus with other circulatory complications: Secondary | ICD-10-CM

## 2023-10-03 DIAGNOSIS — G4733 Obstructive sleep apnea (adult) (pediatric): Secondary | ICD-10-CM

## 2023-10-03 DIAGNOSIS — I152 Hypertension secondary to endocrine disorders: Secondary | ICD-10-CM

## 2023-10-03 DIAGNOSIS — I10 Essential (primary) hypertension: Secondary | ICD-10-CM

## 2023-10-03 DIAGNOSIS — G4700 Insomnia, unspecified: Secondary | ICD-10-CM

## 2023-10-03 DIAGNOSIS — M5416 Radiculopathy, lumbar region: Secondary | ICD-10-CM

## 2023-10-03 DIAGNOSIS — N951 Menopausal and female climacteric states: Secondary | ICD-10-CM

## 2023-10-03 DIAGNOSIS — E785 Hyperlipidemia, unspecified: Secondary | ICD-10-CM | POA: Diagnosis not present

## 2023-10-03 DIAGNOSIS — F325 Major depressive disorder, single episode, in full remission: Secondary | ICD-10-CM | POA: Diagnosis not present

## 2023-10-03 DIAGNOSIS — E1142 Type 2 diabetes mellitus with diabetic polyneuropathy: Secondary | ICD-10-CM | POA: Diagnosis not present

## 2023-10-03 DIAGNOSIS — E1129 Type 2 diabetes mellitus with other diabetic kidney complication: Secondary | ICD-10-CM | POA: Diagnosis not present

## 2023-10-03 LAB — POCT GLYCOSYLATED HEMOGLOBIN (HGB A1C): Hemoglobin A1C: 6.3 % — AB (ref 4.0–5.6)

## 2023-10-03 MED ORDER — VALSARTAN 40 MG PO TABS: 40.0000 mg | ORAL_TABLET | Freq: Every day | ORAL | 1 refills | Status: DC

## 2023-10-03 MED ORDER — TEMAZEPAM 30 MG PO CAPS: 30.0000 mg | ORAL_CAPSULE | Freq: Every day | ORAL | 0 refills | Status: DC

## 2023-10-03 MED ORDER — ARIPIPRAZOLE 2 MG PO TABS: 2.0000 mg | ORAL_TABLET | Freq: Every day | ORAL | 1 refills | Status: DC

## 2023-10-03 MED ORDER — SERTRALINE HCL 100 MG PO TABS: 100.0000 mg | ORAL_TABLET | Freq: Every day | ORAL | 1 refills | Status: DC

## 2023-10-03 MED ORDER — ROSUVASTATIN CALCIUM 40 MG PO TABS: ORAL_TABLET | ORAL | 3 refills | Status: DC

## 2023-10-03 MED ORDER — TIRZEPATIDE 10 MG/0.5ML ~~LOC~~ SOAJ: 10.0000 mg | SUBCUTANEOUS | 0 refills | Status: DC

## 2023-10-03 NOTE — Progress Notes (Signed)
 Name: Joyce Blackburn   MRN: [id number]    DOB: 12-15-1960   Date:10/03/2023       Progress Note  Subjective  Chief Complaint  Chief Complaint  Patient presents with   Medical Management of Chronic Issues   HPI   DMII:  A1C today is 6.3 %. She has been compliant with Toujeo, Mounjaro 7.5 mg , pre meal insulin and  Metformin 1500 mg . Since glucose has been dropping in early am's, advised to go down on Toujeo to 45 units and we will go up on Mounjaro to 10 mg weekly. Dexcom 7 app reviewed and copy on her chart. 90% in range, less than 1% hypoglycemia. She has associated dyslipidemia, obesity, HTN and microalbuminuria. She is on statin, ARB and if GFR remains low we will add SGL-2 agonist    GERD: she also has a history of gastric ulcer over 20  years ago. She states occasionally still has nausea with certain smells, like grease. She was seen by Dr. Lars Pinks and had multiple labs done for evaluation of fatty liver, HIDA was normal. She is on Pantoprazole ( Dexilant no longer covered).   OSA: she is back on CPAP every night , she wakes up feeling rested    Dyslipidemia: she is current taking Rosuvastatin , LDL has been at goal - last level was 49 . We will recheck labs    HTN: BP is at goal, she is taking 40 mg of valsartan and denies side effects, no chest pain or palpitation    Vitamin D deficiency: she is not taking vitamin D otc at this time   Menopause/major depression mild : she states Zoloft 100 mg  and states hot flashes is still present but night sweats has improved she is doing well, she was feeling more emotional and we added Abilify back in 02/2020. She is doing well on medication    Insomnia: she is taking Temazepam and it helps her fall and stay asleep, no side effects of medications  - denies daytime somnolence.    Obesity  she is now on Mounjarno 7.5 mg  and since she started wearing the Dexcom 7 she has been more compliant with a diabetic diet and weight is down to 206 lbs .  Continue life style modification   Migraine headaches:  Episodes start suddenly associated with nausea and vomiting.  states Bernita Raisin works but was making her sleepy. She states not migraine episodes in over one  year and no longer needs medication    Sciatica :long history of recurrent lower back pain since 2019 , but since 2022  has intermittent radiculitis . She  took prednisone in July 2022, she is taking Lyrica, no longer on Tylenol # 3, she is seeing chiropractor and doing some home exercises but states after sitting for a prolonged period of time, she has been able to walk a little further lately. She states Lyrica seems to be working well for her    MRI Dec 2022    IMPRESSION:  1. Retrolisthesis of L3-L4 and L4-L5 with posterior degenerative changes causes mild lateral recess and foraminal stenosis at these levels.  2.  Bulging degenerative disc and retrolisthesis at L5-S1. Mild foraminal encroachment and far lateral displacement of the L5 nerves. Correlate for L5 radiculopathy.  3.  No central stenosis at any level. Upper lumbar spine is benign    B12 deficiency: she stopped B12 supplementation     Patient Active Problem List   Diagnosis Date Noted  Obesity (BMI 30.0-34.9) 01/14/2023   OSA (obstructive sleep apnea) 10/11/2022   Mild recurrent major depression (HCC) 12/26/2021   Lumbar back pain with radiculopathy affecting right lower extremity 12/26/2021   Sciatic nerve pain, right 12/26/2021   Major depression in remission (HCC) 12/19/2017   Fatty liver 03/04/2016   Vitamin D deficiency 03/04/2016   Gastroesophageal reflux disease without esophagitis 07/04/2015   Diabetic polyneuropathy associated with type 2 diabetes mellitus (HCC) 03/03/2015   Peripheral neuropathy 03/03/2015   Allergic to bees 02/20/2015   Benign hypertension 02/20/2015   Insomnia, persistent 02/20/2015   Chronic idiopathic constipation 02/20/2015   Dyslipidemia 02/20/2015   Dermatitis, eczematoid  02/20/2015   H/O gastric ulcer 02/20/2015   Herpes 02/20/2015   Migraine without aura and without status migrainosus, not intractable 02/20/2015   Morbid obesity (HCC) 02/20/2015   Paresthesia of arm 02/20/2015   Periodic limb movement 02/20/2015   Allergic rhinitis, seasonal 02/20/2015   Menopausal symptom 02/20/2015    Past Surgical History:  Procedure Laterality Date   ABDOMINAL HYSTERECTOMY     COLONOSCOPY WITH PROPOFOL N/A 05/26/2020   Procedure: COLONOSCOPY WITH PROPOFOL;  Surgeon: Wyline Mood, MD;  Location: [facility] ENDOSCOPY;  Service: Gastroenterology;  Laterality: N/A;   RETINAL TEAR REPAIR CRYOTHERAPY Right    [facility] (Dr. Robin Searing)    Family History  Problem Relation Age of Onset   Mental illness Mother    Diabetes Mother    CVA Mother    Schizophrenia Mother    Diabetes Sister    Hypertension Sister    Diabetes Brother    Mental illness Brother    Bipolar disorder Brother    Schizophrenia Brother     Social History   Tobacco Use   Smoking status: Former    Current packs/day: 0.00    Average packs/day: 0.1 packs/day for 15.0 years (1.5 ttl pk-yrs)    Types: Cigarettes    Start date: 07/29/1989    Quit date: 07/29/2004    Years since quitting: 19.1   Smokeless tobacco: Never  Substance Use Topics   Alcohol use: No    Alcohol/week: 0.0 standard drinks of alcohol     Current Outpatient Medications:    Alcohol Swabs (ALCOHOL PREP) 70 % PADS, USE 3 TIMES DAILY AS NEEDED, Disp: 300 each, Rfl: 1   ARIPiprazole (ABILIFY) 2 MG tablet, Take 1 tablet (2 mg total) by mouth daily., Disp: 90 tablet, Rfl: 1   aspirin 81 MG tablet, Take 81 mg by mouth daily., Disp: , Rfl:    benzonatate (TESSALON) 100 MG capsule, Take 1-2 capsules (100-200 mg total) by mouth 3 (three) times daily as needed., Disp: 30 capsule, Rfl: 0   Cholecalciferol (VITAMIN D3) 1.25 MG (50000 UT) capsule, Take 1 capsule (50,000 Units total) by mouth every 7 (seven) days., Disp: 12 capsule, Rfl: 1    Continuous Glucose Sensor (DEXCOM G7 SENSOR) MISC, 1 each by Other route See admin instructions., Disp: 9 each, Rfl: 3   EPINEPHrine (EPIPEN 2-PAK) 0.3 mg/0.3 mL IJ SOAJ injection, Inject 0.3 mg into the muscle as needed for anaphylaxis., Disp: 2 each, Rfl: 0   fluticasone (FLONASE) 50 MCG/ACT nasal spray, Place 2 sprays into both nostrils daily., Disp: 16 g, Rfl: 0   glucose blood (CONTOUR NEXT TEST) test strip, USE AS DIRECTED ONCE DAILY, Disp: 100 strip, Rfl: 1   insulin glargine, 2 Unit Dial, (TOUJEO MAX SOLOSTAR) 300 UNIT/ML Solostar Pen, Inject 48 Units into the skin daily at 12 noon., Disp: 18 mL,  Rfl: 0   insulin lispro (HUMALOG KWIKPEN) 100 UNIT/ML KwikPen, CHECK FSBS PRIOR TO MEALS. HOLD  IF BELOW 110. 110-140 GIVE 4  UNITS. 140 TO 180 GIVE 6 UNITS.  180 TO 220 GIVE 8 UNITS. IF  GLUCOSE ABOVE 220 GIVE 10 UNITS., Disp: 30 mL, Rfl: 1   loratadine (CLARITIN) 10 MG tablet, Take 1 tablet (10 mg total) by mouth daily., Disp: 30 tablet, Rfl: 11   metFORMIN (GLUCOPHAGE-XR) 750 MG 24 hr tablet, Take 2 tablets (1,500 mg total) by mouth daily with breakfast., Disp: 180 tablet, Rfl: 1   montelukast (SINGULAIR) 10 MG tablet, TAKE 1 TABLET BY MOUTH AT  BEDTIME, Disp: 90 tablet, Rfl: 3   NOVOFINE PEN NEEDLE 32G X 6 MM MISC, USE TO INJECT INTO THE SKIN  TWICE DAILY, Disp: 180 each, Rfl: 0   pantoprazole (PROTONIX) 40 MG tablet, TAKE 1 TABLET BY MOUTH DAILY, Disp: 90 tablet, Rfl: 1   pregabalin (LYRICA) 75 MG capsule, Take 1 capsule (75 mg total) by mouth 2 (two) times daily., Disp: 180 capsule, Rfl: 1   rosuvastatin (CRESTOR) 40 MG tablet, TAKE 1 TABLET BY MOUTH DAILY FOR CHOLESTEROL, Disp: 90 tablet, Rfl: 3   sertraline (ZOLOFT) 100 MG tablet, Take 1 tablet (100 mg total) by mouth daily., Disp: 90 tablet, Rfl: 1   temazepam (RESTORIL) 30 MG capsule, Take 1 capsule (30 mg total) by mouth daily., Disp: 90 capsule, Rfl: 0   tirzepatide (MOUNJARO) 7.5 MG/0.5ML Pen, Inject 7.5 mg into the skin once a week.,  Disp: 6 mL, Rfl: 0   valACYclovir (VALTREX) 1000 MG tablet, Take 1 tablet (1,000 mg total) by mouth 2 (two) times daily. Prn, Disp: 60 tablet, Rfl: 0   valsartan (DIOVAN) 40 MG tablet, Take 1 tablet (40 mg total) by mouth daily., Disp: 1 tablet, Rfl: 0   Glucagon (GVOKE HYPOPEN 1-PACK) 1 MG/0.2ML SOAJ, Inject 1 mg into the skin as needed (for hypoglycemic episode)., Disp: 0.2 mL, Rfl: 3   triamcinolone ointment (KENALOG) 0.5 %, Apply 1 application topically 2 (two) times daily., Disp: 60 g, Rfl: 0  Allergies  Allergen Reactions   Ace Inhibitors     cough   Bee Venom     Bee   Dapagliflozin     hematuria   Ozempic (0.25 Or 0.5 Mg-Dose) [Semaglutide(0.25 Or 0.5mg -Dos)]     Bloating    Actos [Pioglitazone] Rash    I personally reviewed active problem list, medication list, allergies with the patient/caregiver today.   ROS  Ten systems reviewed and is negative except as mentioned in HPI    Objective  Vitals:   10/03/23 0906  BP: 120/74  Pulse: 81  Resp: 16  Temp: 98.1 F (36.7 C)  TempSrc: Oral  SpO2: 97%  Weight: 206 lb 3.2 oz (93.5 kg)  Height: 5\' 7"  (1.702 m)    Body mass index is 32.3 kg/m.  Physical Exam  Constitutional: Patient appears well-developed and well-nourished. Obese  No distress.  HEENT: head atraumatic, normocephalic, pupils equal and reactive to light, neck supple Cardiovascular: Normal rate, regular rhythm and normal heart sounds.  No murmur heard. No BLE edema. Pulmonary/Chest: Effort normal and breath sounds normal. No respiratory distress. Abdominal: Soft.  There is no tenderness. Psychiatric: Patient has a normal mood and affect. behavior is normal. Judgment and thought content normal.   Recent Results (from the past 2160 hours)  POCT glycosylated hemoglobin (Hb A1C)     Status: Abnormal   Collection Time: 10/03/23  9:16  AM  Result Value Ref Range   Hemoglobin A1C 6.3 (A) 4.0 - 5.6 %   HbA1c POC (<> result, manual entry)     HbA1c, POC  (prediabetic range)     HbA1c, POC (controlled diabetic range)      Diabetic Foot Exam:     PHQ2/9:    10/03/2023    9:08 AM 07/01/2023    1:20 PM 04/17/2023   11:13 AM 01/14/2023    7:58 AM 10/11/2022    8:17 AM  Depression screen PHQ 2/9  Decreased Interest 0 0 1 1 1   Down, Depressed, Hopeless 0 0 0 0 1  PHQ - 2 Score 0 0 1 1 2   Altered sleeping 0 0 0 0 0  Tired, decreased energy 0 0 0 0 0  Change in appetite 0 0 0 0 0  Feeling bad or failure about yourself  0 0 0 0 0  Trouble concentrating 0 0 0 0 0  Moving slowly or fidgety/restless 0 0 0 0 0  Suicidal thoughts 0 0 0 0 0  PHQ-9 Score 0 0 1 1 2   Difficult doing work/chores Not difficult at all Not difficult at all Not difficult at all Not difficult at all     phq 9 is negative  Fall Risk:    07/01/2023    1:20 PM 04/17/2023   11:10 AM 01/14/2023    7:46 AM 10/11/2022    8:17 AM 07/02/2022   10:00 AM  Fall Risk   Falls in the past year? 0 0 1 1 0  Number falls in past yr: 0  1 0 0  Injury with Fall? 0  0 0 0  Risk for fall due to : No Fall Risks No Fall Risks History of fall(s) No Fall Risks No Fall Risks  Follow up Falls prevention discussed;Education provided;Falls evaluation completed Falls prevention discussed Falls prevention discussed;Education provided;Falls evaluation completed Falls prevention discussed Falls prevention discussed     Assessment & Plan  1. Diabetes mellitus with microalbuminuria (HCC) (Primary)  Doing well on medication, adjusting dose   2. Dyslipidemia associated with type 2 diabetes mellitus (HCC)  - POCT glycosylated hemoglobin (Hb A1C) - rosuvastatin (CRESTOR) 40 MG tablet; TAKE 1 TABLET BY MOUTH DAILY FOR CHOLESTEROL  Dispense: 90 tablet; Refill: 3  3. Major depression in remission (HCC)  - ARIPiprazole (ABILIFY) 2 MG tablet; Take 1 tablet (2 mg total) by mouth daily.  Dispense: 90 tablet; Refill: 1  4. Diabetic polyneuropathy associated with type 2 diabetes mellitus (HCC)  Doing  well on Lyrica  5. Hypertension associated with diabetes (HCC)  - valsartan (DIOVAN) 40 MG tablet; Take 1 tablet (40 mg total) by mouth daily.  Dispense: 90 tablet; Refill: 1  6. Benign hypertension  - valsartan (DIOVAN) 40 MG tablet; Take 1 tablet (40 mg total) by mouth daily.  Dispense: 90 tablet; Refill: 1  7. OSA on CPAP  Continue CPAP  8. Obesity (BMI 30.0-34.9)  Discussed with the patient the risk posed by an increased BMI. Discussed importance of portion control, calorie counting and at least 150 minutes of physical activity weekly. Avoid sweet beverages and drink more water. Eat at least 6 servings of fruit and vegetables daily    9. Lumbar back pain with radiculopathy affecting right lower extremity  Continue Lyrica  10. Insomnia, persistent  - temazepam (RESTORIL) 30 MG capsule; Take 1 capsule (30 mg total) by mouth daily.  Dispense: 90 capsule; Refill: 0  11. Menopausal symptom  -  sertraline (ZOLOFT) 100 MG tablet; Take 1 tablet (100 mg total) by mouth daily.  Dispense: 90 tablet; Refill: 1

## 2023-11-01 ENCOUNTER — Other Ambulatory Visit: Payer: Self-pay | Admitting: Family Medicine

## 2023-11-01 DIAGNOSIS — E1165 Type 2 diabetes mellitus with hyperglycemia: Secondary | ICD-10-CM

## 2023-11-01 DIAGNOSIS — E1169 Type 2 diabetes mellitus with other specified complication: Secondary | ICD-10-CM

## 2023-11-08 ENCOUNTER — Other Ambulatory Visit: Payer: Self-pay | Admitting: Family Medicine

## 2023-11-09 ENCOUNTER — Other Ambulatory Visit: Payer: Self-pay | Admitting: Family Medicine

## 2023-11-09 DIAGNOSIS — K219 Gastro-esophageal reflux disease without esophagitis: Secondary | ICD-10-CM

## 2023-11-10 NOTE — Telephone Encounter (Signed)
 Patient was just seen on 09/2023

## 2023-11-22 ENCOUNTER — Other Ambulatory Visit: Payer: Self-pay | Admitting: Family Medicine

## 2023-11-22 DIAGNOSIS — E1142 Type 2 diabetes mellitus with diabetic polyneuropathy: Secondary | ICD-10-CM

## 2023-11-24 ENCOUNTER — Other Ambulatory Visit: Payer: Self-pay | Admitting: Family Medicine

## 2023-11-24 DIAGNOSIS — K219 Gastro-esophageal reflux disease without esophagitis: Secondary | ICD-10-CM

## 2023-12-01 ENCOUNTER — Other Ambulatory Visit: Payer: Self-pay | Admitting: Family Medicine

## 2023-12-01 DIAGNOSIS — E1169 Type 2 diabetes mellitus with other specified complication: Secondary | ICD-10-CM

## 2023-12-02 NOTE — Telephone Encounter (Signed)
 Last f/u 09/2023

## 2024-01-08 ENCOUNTER — Other Ambulatory Visit: Payer: Self-pay | Admitting: Family Medicine

## 2024-01-09 ENCOUNTER — Ambulatory Visit: Admitting: Family Medicine

## 2024-01-09 ENCOUNTER — Encounter: Payer: Self-pay | Admitting: Family Medicine

## 2024-01-09 VITALS — BP 114/70 | HR 92 | Resp 16 | Ht 67.0 in | Wt 208.6 lb

## 2024-01-09 DIAGNOSIS — E1142 Type 2 diabetes mellitus with diabetic polyneuropathy: Secondary | ICD-10-CM

## 2024-01-09 DIAGNOSIS — E1129 Type 2 diabetes mellitus with other diabetic kidney complication: Secondary | ICD-10-CM | POA: Diagnosis not present

## 2024-01-09 DIAGNOSIS — G4733 Obstructive sleep apnea (adult) (pediatric): Secondary | ICD-10-CM

## 2024-01-09 DIAGNOSIS — R809 Proteinuria, unspecified: Secondary | ICD-10-CM | POA: Diagnosis not present

## 2024-01-09 DIAGNOSIS — M5416 Radiculopathy, lumbar region: Secondary | ICD-10-CM

## 2024-01-09 DIAGNOSIS — E1169 Type 2 diabetes mellitus with other specified complication: Secondary | ICD-10-CM | POA: Diagnosis not present

## 2024-01-09 DIAGNOSIS — E785 Hyperlipidemia, unspecified: Secondary | ICD-10-CM

## 2024-01-09 DIAGNOSIS — F99 Mental disorder, not otherwise specified: Secondary | ICD-10-CM

## 2024-01-09 DIAGNOSIS — G4700 Insomnia, unspecified: Secondary | ICD-10-CM

## 2024-01-09 DIAGNOSIS — R42 Dizziness and giddiness: Secondary | ICD-10-CM

## 2024-01-09 DIAGNOSIS — Z23 Encounter for immunization: Secondary | ICD-10-CM | POA: Diagnosis not present

## 2024-01-09 DIAGNOSIS — K219 Gastro-esophageal reflux disease without esophagitis: Secondary | ICD-10-CM

## 2024-01-09 LAB — POCT GLYCOSYLATED HEMOGLOBIN (HGB A1C): Hemoglobin A1C: 6.4 % — AB (ref 4.0–5.6)

## 2024-01-09 MED ORDER — TEMAZEPAM 30 MG PO CAPS: 30.0000 mg | ORAL_CAPSULE | Freq: Every day | ORAL | 0 refills | Status: DC

## 2024-01-09 MED ORDER — PANTOPRAZOLE SODIUM 40 MG PO TBEC
40.0000 mg | DELAYED_RELEASE_TABLET | Freq: Every day | ORAL | 1 refills | Status: AC
Start: 2024-01-09 — End: ?

## 2024-01-09 MED ORDER — PREGABALIN 75 MG PO CAPS: 75.0000 mg | ORAL_CAPSULE | Freq: Two times a day (BID) | ORAL | 1 refills | Status: DC

## 2024-01-09 NOTE — Progress Notes (Signed)
 Name: Joyce Blackburn   MRN: [id number]    DOB: Aug 06, 1960   Date:01/09/2024       Progress Note  Subjective  Chief Complaint  Chief Complaint  Patient presents with   Medical Management of Chronic Issues   HPI   DMII:  A1C today is 6.4%  She has been compliant with Toujeo , Mounjaro  10  mg , pre meal insulin  and  Metformin  1500 mg . She only has hypoglycemia when she mis calculate and does not eat enough .  She has associated dyslipidemia, obesity, HTN and microalbuminuria. She is on statin, ARB and if GFR remains low we will add SGL-2 agonist . We will recheck next visit. CGM data reviewed from [facility]. Average glucose 129 mg/DL, GMI 6.4 %, sensor usage 13/14 days, standard deviation 34 mg/dl.     GERD: she also has a history of gastric ulcer over 20  years ago. She states occasionally still has nausea with certain smells, like grease. She was seen by Dr. Barnie Bora and had multiple labs done for evaluation of fatty liver, HIDA was normal. She is on Pantoprazole  daily and symptoms are controlled    OSA: she is back on CPAP every night , she wakes up feeling rested.    Dyslipidemia: she is current taking Rosuvastatin  , LDL has been at goal - last level was 49 . Recheck labs next visit   HTN: BP is at goal, she is taking 40 mg of valsartan  and denies side effects, no chest pain or palpitation but she has noticed a dizziness when looking up for a prolonged of time.    Menopause/major depression mild : she states Zoloft  100 mg  and states hot flashes is still present but night sweats has improved she is doing well, she was feeling more emotional and we added Abilify  back in 02/2020. She is doing well on medication . She is doing well now  Insomnia: she is taking Temazepam  and it helps her fall and stay asleep, no side effects of medications  - denies daytime somnolence. Continue current medication    Obesity  she is now on Mounjarno 10  mg  and since she started wearing the Dexcom 7 she has been more  compliant with a diabetic diet , she still eats some carbohydrates intermittently.    Migraine headaches:  Episodes start suddenly associated with nausea and vomiting.  states Ubrelvy  works but was making her sleepy. She states not migraine episodes in over one  year and no longer needs medication    Sciatica :long history of recurrent lower back pain since 2019 , but since 2022  has intermittent radiculitis . She  took prednisone  in July 2022, she is taking Lyrica , no longer on Tylenol  # 3, she is seeing chiropractor and doing some home exercises but states after sitting for a prolonged period of time, she has been able to walk a little further lately. She states Lyrica  seems to be working well for her , no side effects   MRI Dec 2022    IMPRESSION:  1. Retrolisthesis of L3-L4 and L4-L5 with posterior degenerative changes causes mild lateral recess and foraminal stenosis at these levels.  2.  Bulging degenerative disc and retrolisthesis at L5-S1. Mild foraminal encroachment and far lateral displacement of the L5 nerves. Correlate for L5 radiculopathy.  3.  No central stenosis at any level. Upper lumbar spine is benign    B12 deficiency: she stopped B12 supplementation     Patient Active Problem List  Diagnosis Date Noted   Obesity (BMI 30.0-34.9) 01/14/2023   OSA (obstructive sleep apnea) 10/11/2022   Mild recurrent major depression (HCC) 12/26/2021   Lumbar back pain with radiculopathy affecting right lower extremity 12/26/2021   Sciatic nerve pain, right 12/26/2021   Major depression in remission (HCC) 12/19/2017   Fatty liver 03/04/2016   Vitamin D  deficiency 03/04/2016   Gastroesophageal reflux disease without esophagitis 07/04/2015   Diabetic polyneuropathy associated with type 2 diabetes mellitus (HCC) 03/03/2015   Peripheral neuropathy 03/03/2015   Allergic to bees 02/20/2015   Benign hypertension 02/20/2015   Insomnia, persistent 02/20/2015   Chronic idiopathic constipation  02/20/2015   Dyslipidemia 02/20/2015   Dermatitis, eczematoid 02/20/2015   H/O gastric ulcer 02/20/2015   Herpes 02/20/2015   Migraine without aura and without status migrainosus, not intractable 02/20/2015   Morbid obesity (HCC) 02/20/2015   Paresthesia of arm 02/20/2015   Periodic limb movement 02/20/2015   Allergic rhinitis, seasonal 02/20/2015   Menopausal symptom 02/20/2015    Past Surgical History:  Procedure Laterality Date   ABDOMINAL HYSTERECTOMY     COLONOSCOPY WITH PROPOFOL  N/A 05/26/2020   Procedure: COLONOSCOPY WITH PROPOFOL ;  Surgeon: Luke Salaam, MD;  Location: [facility] ENDOSCOPY;  Service: Gastroenterology;  Laterality: N/A;   RETINAL TEAR REPAIR CRYOTHERAPY Right    [facility] (Dr. Arta Lark)    Family History  Problem Relation Age of Onset   Mental illness Mother    Diabetes Mother    CVA Mother    Schizophrenia Mother    Diabetes Sister    Hypertension Sister    Diabetes Brother    Mental illness Brother    Bipolar disorder Brother    Schizophrenia Brother     Social History   Tobacco Use   Smoking status: Former    Current packs/day: 0.00    Average packs/day: 0.1 packs/day for 15.0 years (1.5 ttl pk-yrs)    Types: Cigarettes    Start date: 07/29/1989    Quit date: 07/29/2004    Years since quitting: 19.4   Smokeless tobacco: Never  Substance Use Topics   Alcohol  use: No    Alcohol /week: 0.0 standard drinks of alcohol      Current Outpatient Medications:    Alcohol  Swabs (ALCOHOL  PREP) 70 % PADS, USE 3 TIMES DAILY AS NEEDED, Disp: 300 each, Rfl: 1   ARIPiprazole  (ABILIFY ) 2 MG tablet, Take 1 tablet (2 mg total) by mouth daily., Disp: 90 tablet, Rfl: 1   aspirin 81 MG tablet, Take 81 mg by mouth daily., Disp: , Rfl:    Cholecalciferol (VITAMIN D3) 1.25 MG (50000 UT) capsule, Take 1 capsule (50,000 Units total) by mouth every 7 (seven) days., Disp: 12 capsule, Rfl: 1   Continuous Glucose Sensor (DEXCOM G7 SENSOR) MISC, 1 each by Other route See  admin instructions., Disp: 9 each, Rfl: 3   EPINEPHrine  (EPIPEN  2-PAK) 0.3 mg/0.3 mL IJ SOAJ injection, Inject 0.3 mg into the muscle as needed for anaphylaxis., Disp: 2 each, Rfl: 0   fluticasone  (FLONASE ) 50 MCG/ACT nasal spray, Place 2 sprays into both nostrils daily., Disp: 16 g, Rfl: 0   Glucagon  (GVOKE HYPOPEN  1-PACK) 1 MG/0.2ML SOAJ, Inject 1 mg into the skin as needed (for hypoglycemic episode)., Disp: 0.2 mL, Rfl: 3   glucose blood (CONTOUR NEXT TEST) test strip, USE AS DIRECTED ONCE DAILY, Disp: 100 strip, Rfl: 1   insulin  lispro (HUMALOG  KWIKPEN) 100 UNIT/ML KwikPen, INJECT SUBCUTANEOUSLY PER FSBS  CHECK PRIOR TO MEALS. HOLD IF  BELOW 110,  110-140 = 4U,  140-180= 6U, 180-220= 8U, IF  GLUCOSE ABOVE 220= 10U, Disp: 30 mL, Rfl: 3   loratadine  (CLARITIN ) 10 MG tablet, Take 1 tablet (10 mg total) by mouth daily., Disp: 30 tablet, Rfl: 11   metFORMIN  (GLUCOPHAGE -XR) 750 MG 24 hr tablet, TAKE 2 TABLETS BY MOUTH DAILY, Disp: 180 tablet, Rfl: 1   montelukast  (SINGULAIR ) 10 MG tablet, TAKE 1 TABLET BY MOUTH AT  BEDTIME, Disp: 90 tablet, Rfl: 3   NOVOFINE PEN NEEDLE 32G X 6 MM MISC, USE TO INJECT INTO THE SKIN  TWICE DAILY, Disp: 180 each, Rfl: 0   pantoprazole  (PROTONIX ) 40 MG tablet, TAKE 1 TABLET BY MOUTH DAILY, Disp: 90 tablet, Rfl: 0   pregabalin  (LYRICA ) 75 MG capsule, Take 1 capsule (75 mg total) by mouth 2 (two) times daily., Disp: 180 capsule, Rfl: 1   rosuvastatin  (CRESTOR ) 40 MG tablet, TAKE 1 TABLET BY MOUTH DAILY FOR CHOLESTEROL, Disp: 90 tablet, Rfl: 3   sertraline  (ZOLOFT ) 100 MG tablet, Take 1 tablet (100 mg total) by mouth daily., Disp: 90 tablet, Rfl: 1   temazepam  (RESTORIL ) 30 MG capsule, Take 1 capsule (30 mg total) by mouth daily., Disp: 90 capsule, Rfl: 0   tirzepatide  (MOUNJARO ) 10 MG/0.5ML Pen, Inject 10 mg into the skin once a week., Disp: 6 mL, Rfl: 0   TOUJEO  MAX SOLOSTAR 300 UNIT/ML Solostar Pen, INJECT 48 UNITS INTO THE SKIN  DAILY AT 12 NOON, Disp: 18 mL, Rfl: 3    valACYclovir  (VALTREX ) 1000 MG tablet, Take 1 tablet (1,000 mg total) by mouth 2 (two) times daily. Prn, Disp: 60 tablet, Rfl: 0   valsartan  (DIOVAN ) 40 MG tablet, Take 1 tablet (40 mg total) by mouth daily., Disp: 90 tablet, Rfl: 1  Allergies  Allergen Reactions   Ace Inhibitors     cough   Bee Venom     Bee   Dapagliflozin      hematuria   Ozempic  (0.25 Or 0.5 Mg-Dose) [Semaglutide (0.25 Or 0.5mg -Dos)]     Bloating    Actos  [Pioglitazone ] Rash    I personally reviewed active problem list, medication list, allergies with the patient/caregiver today.   ROS  Ten systems reviewed and is negative except as mentioned in HPI    Objective Physical Exam Constitutional: Patient appears well-developed and well-nourished. Obese  No distress.  HEENT: head atraumatic, normocephalic, pupils equal and reactive to light, neck supple Cardiovascular: Normal rate, regular rhythm and normal heart sounds.  No murmur heard. No BLE edema. Pulmonary/Chest: Effort normal and breath sounds normal. No respiratory distress. Abdominal: Soft.  There is no tenderness. Psychiatric: Patient has a normal mood and affect. behavior is normal. Judgment and thought content normal.   Vitals:   01/09/24 0923  BP: 114/70  Pulse: 92  Resp: 16  SpO2: 98%  Weight: 208 lb 9.6 oz (94.6 kg)  Height: 5' 7 (1.702 m)    Body mass index is 32.67 kg/m.  Recent Results (from the past 2160 hours)  POCT glycosylated hemoglobin (Hb A1C)     Status: Abnormal   Collection Time: 01/09/24  9:27 AM  Result Value Ref Range   Hemoglobin A1C 6.4 (A) 4.0 - 5.6 %   HbA1c POC (<> result, manual entry)     HbA1c, POC (prediabetic range)     HbA1c, POC (controlled diabetic range)      Diabetic Foot Exam:     PHQ2/9:    01/09/2024    9:22 AM 10/03/2023    9:08 AM  07/01/2023    1:20 PM 04/17/2023   11:13 AM 01/14/2023    7:58 AM  Depression screen PHQ 2/9  Decreased Interest 0 0 0 1 1  Down, Depressed, Hopeless 0 0 0 0 0   PHQ - 2 Score 0 0 0 1 1  Altered sleeping 0 0 0 0 0  Tired, decreased energy 0 0 0 0 0  Change in appetite 0 0 0 0 0  Feeling bad or failure about yourself  0 0 0 0 0  Trouble concentrating 0 0 0 0 0  Moving slowly or fidgety/restless 0 0 0 0 0  Suicidal thoughts 0 0 0 0 0  PHQ-9 Score 0 0 0 1 1  Difficult doing work/chores Not difficult at all Not difficult at all Not difficult at all Not difficult at all Not difficult at all    phq 9 is negative  Fall Risk:    01/09/2024    9:15 AM 07/01/2023    1:20 PM 04/17/2023   11:10 AM 01/14/2023    7:46 AM 10/11/2022    8:17 AM  Fall Risk   Falls in the past year? 0 0 0 1 1  Number falls in past yr: 0 0  1 0  Injury with Fall? 0 0  0 0  Risk for fall due to : No Fall Risks No Fall Risks No Fall Risks History of fall(s) No Fall Risks  Follow up Falls prevention discussed;Education provided;Falls evaluation completed Falls prevention discussed;Education provided;Falls evaluation completed Falls prevention discussed Falls prevention discussed;Education provided;Falls evaluation completed Falls prevention discussed     Assessment & Plan  1. Diabetes mellitus with microalbuminuria (HCC) (Primary)  - POCT glycosylated hemoglobin (Hb A1C)  2. Dyslipidemia associated with type 2 diabetes mellitus (HCC)  On statin therapy   3. Diabetic polyneuropathy associated with type 2 diabetes mellitus (HCC)  - pregabalin  (LYRICA ) 75 MG capsule; Take 1 capsule (75 mg total) by mouth 2 (two) times daily.  Dispense: 180 capsule; Refill: 1  4. Immunization due  - Pneumococcal conjugate vaccine 20-valent (Prevnar 20)  5. Dizzinesses  Discussed vertebral artery stenosis - when looking up - she does not want to get doppler US    6. OSA on CPAP  Continue compliance  7. Lumbar back pain with radiculopathy affecting right lower extremity  - pregabalin  (LYRICA ) 75 MG capsule; Take 1 capsule (75 mg total) by mouth 2 (two) times daily.  Dispense: 180  capsule; Refill: 1  8. Insomnia, persistent  - temazepam  (RESTORIL ) 30 MG capsule; Take 1 capsule (30 mg total) by mouth daily.  Dispense: 90 capsule; Refill: 0  9. Gastroesophageal reflux disease without esophagitis  - pantoprazole  (PROTONIX ) 40 MG tablet; Take 1 tablet (40 mg total) by mouth daily.  Dispense: 90 tablet; Refill: 1    10. Mental dysfunction  She has noticed mental fogginess , she asked if it could be Ability, explained it could be post menopausal, ability or lyrica . She states she started to feel like she woke up from when her husband died over 7 years ago and her daughter moved out 6 months ago. She has been more motivated and energized. Discussed trying to stop Abilify  and see how she feels , may also be lyrica , can try to cut down to morning dose

## 2024-02-01 ENCOUNTER — Other Ambulatory Visit: Payer: Self-pay | Admitting: Family Medicine

## 2024-02-01 DIAGNOSIS — E1169 Type 2 diabetes mellitus with other specified complication: Secondary | ICD-10-CM

## 2024-02-01 DIAGNOSIS — E1165 Type 2 diabetes mellitus with hyperglycemia: Secondary | ICD-10-CM

## 2024-02-02 NOTE — Telephone Encounter (Signed)
 Ok to continue 10mg  of mounjaro ?

## 2024-04-03 ENCOUNTER — Other Ambulatory Visit: Payer: Self-pay | Admitting: Family Medicine

## 2024-04-03 DIAGNOSIS — E1142 Type 2 diabetes mellitus with diabetic polyneuropathy: Secondary | ICD-10-CM

## 2024-04-13 ENCOUNTER — Other Ambulatory Visit: Payer: Self-pay | Admitting: Family Medicine

## 2024-04-13 DIAGNOSIS — F325 Major depressive disorder, single episode, in full remission: Secondary | ICD-10-CM

## 2024-04-14 ENCOUNTER — Encounter: Payer: Self-pay | Admitting: Family Medicine

## 2024-04-14 ENCOUNTER — Ambulatory Visit: Admitting: Family Medicine

## 2024-04-14 VITALS — BP 114/74 | HR 95 | Resp 16 | Ht 67.0 in | Wt 206.8 lb

## 2024-04-14 DIAGNOSIS — E1169 Type 2 diabetes mellitus with other specified complication: Secondary | ICD-10-CM

## 2024-04-14 DIAGNOSIS — Z23 Encounter for immunization: Secondary | ICD-10-CM

## 2024-04-14 DIAGNOSIS — E1142 Type 2 diabetes mellitus with diabetic polyneuropathy: Secondary | ICD-10-CM

## 2024-04-14 DIAGNOSIS — E1159 Type 2 diabetes mellitus with other circulatory complications: Secondary | ICD-10-CM

## 2024-04-14 DIAGNOSIS — G4733 Obstructive sleep apnea (adult) (pediatric): Secondary | ICD-10-CM | POA: Diagnosis not present

## 2024-04-14 DIAGNOSIS — E559 Vitamin D deficiency, unspecified: Secondary | ICD-10-CM

## 2024-04-14 DIAGNOSIS — G4700 Insomnia, unspecified: Secondary | ICD-10-CM

## 2024-04-14 DIAGNOSIS — M5416 Radiculopathy, lumbar region: Secondary | ICD-10-CM

## 2024-04-14 DIAGNOSIS — I152 Hypertension secondary to endocrine disorders: Secondary | ICD-10-CM

## 2024-04-14 DIAGNOSIS — B001 Herpesviral vesicular dermatitis: Secondary | ICD-10-CM

## 2024-04-14 DIAGNOSIS — F341 Dysthymic disorder: Secondary | ICD-10-CM

## 2024-04-14 DIAGNOSIS — J302 Other seasonal allergic rhinitis: Secondary | ICD-10-CM

## 2024-04-14 DIAGNOSIS — E538 Deficiency of other specified B group vitamins: Secondary | ICD-10-CM

## 2024-04-14 LAB — POCT GLYCOSYLATED HEMOGLOBIN (HGB A1C): Hemoglobin A1C: 5.8 % — AB (ref 4.0–5.6)

## 2024-04-14 MED ORDER — METFORMIN HCL ER 750 MG PO TB24: 1500.0000 mg | ORAL_TABLET | Freq: Every day | ORAL | 1 refills | Status: AC

## 2024-04-14 MED ORDER — VALSARTAN 40 MG PO TABS: 40.0000 mg | ORAL_TABLET | Freq: Every day | ORAL | 1 refills | Status: AC

## 2024-04-14 MED ORDER — PREGABALIN 75 MG PO CAPS: 75.0000 mg | ORAL_CAPSULE | Freq: Two times a day (BID) | ORAL | 1 refills | Status: DC

## 2024-04-14 MED ORDER — SERTRALINE HCL 50 MG PO TABS: 50.0000 mg | ORAL_TABLET | Freq: Every day | ORAL | 0 refills | Status: AC

## 2024-04-14 MED ORDER — VALACYCLOVIR HCL 1 G PO TABS: 1000.0000 mg | ORAL_TABLET | Freq: Two times a day (BID) | ORAL | 0 refills | Status: AC

## 2024-04-14 NOTE — Progress Notes (Signed)
 Name: Joyce Blackburn   MRN: [id number]    DOB: Dec 27, 1960   Date:04/14/2024       Progress Note  Subjective  Chief Complaint  Chief Complaint  Patient presents with   Medical Management of Chronic Issues   Discussed the use of AI scribe software for clinical note transcription with the patient, who gave verbal consent to proceed.  History of Present Illness Joyce Blackburn is a 63 year old female who presents for a regular follow-up visit.  She manages type 2 diabetes with Mounjaro  10 mg weekly, Toujeo  48 units daily, Humalog  before meals, and metformin . Her A1c has improved from 6.4% to 5.8%, and her weight has decreased from 209 lbs to 206 lbs. She experiences hypoglycemic episodes approximately once a day, typically between breakfast and lunch, which she attributes to the long gap between meals. She uses a continuous glucose monitor with an average glucose of 127 mg/dL.  Hypertension is managed with valsartan  40 mg daily, maintaining her blood pressure well. No dizziness is reported.  For diabetic neuropathy, she takes pregabalin  75 mg twice daily, which has improved the tingling and numbness in her feet.   She is on rosuvastatin  for dyslipidemia with no issues reported. Her LDL was previously noted to be 45 mg/dL.  She uses temazepam  to aid sleep and ensure compliance with her CPAP machine for obstructive sleep apnea, which she uses nightly. She feels well-rested upon waking.  She takes B12 and vitamin D  supplements for previously noted deficiencies without issues. Pantoprazole  is used for reflux, effectively controlling her symptoms.  She has a history of seasonal allergies and is allergic to bees, for which she takes montelukast . She does not currently require an Epipen .  She has a history of depression and was previously on Zoloft  and Abilify , which she has discontinued. She feels well emotionally, attributing any stress to current political issues  She has experienced cold sores  in the past and requests a refill for her medication to manage this condition. She recently received her flu vaccine and is considering the COVID vaccine.    Patient Active Problem List   Diagnosis Date Noted   Diabetes mellitus with microalbuminuria (HCC) 01/09/2024   Obesity (BMI 30.0-34.9) 01/14/2023   Dyslipidemia associated with type 2 diabetes mellitus (HCC) 04/10/2022   Mild recurrent major depression (HCC) 12/26/2021   Lumbar back pain with radiculopathy affecting right lower extremity 12/26/2021   Sciatic nerve pain, right 12/26/2021   Major depression in remission (HCC) 12/19/2017   Fatty liver 03/04/2016   Vitamin D  deficiency 03/04/2016   Gastroesophageal reflux disease without esophagitis 07/04/2015   Peripheral neuropathy 03/03/2015   Allergic to bees 02/20/2015   Benign hypertension 02/20/2015   Insomnia, persistent 02/20/2015   Chronic idiopathic constipation 02/20/2015   Dyslipidemia 02/20/2015   Dermatitis, eczematoid 02/20/2015   H/O gastric ulcer 02/20/2015   Herpes 02/20/2015   Migraine without aura and without status migrainosus, not intractable 02/20/2015   OSA on CPAP 02/20/2015   Paresthesia of arm 02/20/2015   Periodic limb movement 02/20/2015   Allergic rhinitis, seasonal 02/20/2015   Menopausal symptom 02/20/2015    Past Surgical History:  Procedure Laterality Date   ABDOMINAL HYSTERECTOMY     COLONOSCOPY WITH PROPOFOL  N/A 05/26/2020   Procedure: COLONOSCOPY WITH PROPOFOL ;  Surgeon: Therisa Bi, MD;  Location: [facility] ENDOSCOPY;  Service: Gastroenterology;  Laterality: N/A;   RETINAL TEAR REPAIR CRYOTHERAPY Right    [facility] (Dr. Nadyne)    Family History  Problem  Relation Age of Onset   Mental illness Mother    Diabetes Mother    CVA Mother    Schizophrenia Mother    Diabetes Sister    Hypertension Sister    Diabetes Brother    Mental illness Brother    Bipolar disorder Brother    Schizophrenia Brother     Social History    Tobacco Use   Smoking status: Former    Current packs/day: 0.00    Average packs/day: 0.1 packs/day for 15.0 years (1.5 ttl pk-yrs)    Types: Cigarettes    Start date: 07/29/1989    Quit date: 07/29/2004    Years since quitting: 19.7   Smokeless tobacco: Never  Substance Use Topics   Alcohol  use: No    Alcohol /week: 0.0 standard drinks of alcohol      Current Outpatient Medications:    Alcohol  Swabs (ALCOHOL  PREP) 70 % PADS, USE 3 TIMES DAILY AS NEEDED, Disp: 300 each, Rfl: 1   ARIPiprazole  (ABILIFY ) 2 MG tablet, Take 1 tablet (2 mg total) by mouth daily., Disp: 90 tablet, Rfl: 1   aspirin 81 MG tablet, Take 81 mg by mouth daily., Disp: , Rfl:    Cholecalciferol (VITAMIN D3) 1.25 MG (50000 UT) capsule, Take 1 capsule (50,000 Units total) by mouth every 7 (seven) days., Disp: 12 capsule, Rfl: 1   Continuous Glucose Sensor (DEXCOM G7 SENSOR) MISC, 1 each by Other route See admin instructions., Disp: 9 each, Rfl: 3   CONTOUR NEXT TEST test strip, USE AS DIRECTED ONCE DAILY, Disp: 100 strip, Rfl: 3   EPINEPHrine  (EPIPEN  2-PAK) 0.3 mg/0.3 mL IJ SOAJ injection, Inject 0.3 mg into the muscle as needed for anaphylaxis., Disp: 2 each, Rfl: 0   fluticasone  (FLONASE ) 50 MCG/ACT nasal spray, Place 2 sprays into both nostrils daily., Disp: 16 g, Rfl: 0   Glucagon  (GVOKE HYPOPEN  1-PACK) 1 MG/0.2ML SOAJ, Inject 1 mg into the skin as needed (for hypoglycemic episode)., Disp: 0.2 mL, Rfl: 3   insulin  lispro (HUMALOG  KWIKPEN) 100 UNIT/ML KwikPen, INJECT SUBCUTANEOUSLY PER FSBS  CHECK PRIOR TO MEALS. HOLD IF  BELOW 110, 110-140 = 4U,  140-180= 6U, 180-220= 8U, IF  GLUCOSE ABOVE 220= 10U, Disp: 30 mL, Rfl: 3   loratadine  (CLARITIN ) 10 MG tablet, Take 1 tablet (10 mg total) by mouth daily., Disp: 30 tablet, Rfl: 11   metFORMIN  (GLUCOPHAGE -XR) 750 MG 24 hr tablet, TAKE 2 TABLETS BY MOUTH DAILY, Disp: 180 tablet, Rfl: 1   montelukast  (SINGULAIR ) 10 MG tablet, TAKE 1 TABLET BY MOUTH AT  BEDTIME, Disp: 90 tablet,  Rfl: 3   MOUNJARO  10 MG/0.5ML Pen, INJECT THE CONTENTS OF ONE PEN  SUBCUTANEOUSLY WEEKLY AS  DIRECTED, Disp: 6 mL, Rfl: 3   NOVOFINE PEN NEEDLE 32G X 6 MM MISC, USE TO INJECT INTO THE SKIN  TWICE DAILY, Disp: 180 each, Rfl: 0   pantoprazole  (PROTONIX ) 40 MG tablet, Take 1 tablet (40 mg total) by mouth daily., Disp: 90 tablet, Rfl: 1   pregabalin  (LYRICA ) 75 MG capsule, Take 1 capsule (75 mg total) by mouth 2 (two) times daily., Disp: 180 capsule, Rfl: 1   rosuvastatin  (CRESTOR ) 40 MG tablet, TAKE 1 TABLET BY MOUTH DAILY FOR CHOLESTEROL, Disp: 90 tablet, Rfl: 3   sertraline  (ZOLOFT ) 100 MG tablet, Take 1 tablet (100 mg total) by mouth daily., Disp: 90 tablet, Rfl: 1   temazepam  (RESTORIL ) 30 MG capsule, Take 1 capsule (30 mg total) by mouth daily., Disp: 90 capsule, Rfl: 0  TOUJEO  MAX SOLOSTAR 300 UNIT/ML Solostar Pen, INJECT 48 UNITS INTO THE SKIN  DAILY AT 12 NOON, Disp: 18 mL, Rfl: 3   valACYclovir  (VALTREX ) 1000 MG tablet, Take 1 tablet (1,000 mg total) by mouth 2 (two) times daily. Prn, Disp: 60 tablet, Rfl: 0   valsartan  (DIOVAN ) 40 MG tablet, Take 1 tablet (40 mg total) by mouth daily., Disp: 90 tablet, Rfl: 1  Allergies  Allergen Reactions   Ace Inhibitors     cough   Bee Venom     Bee   Dapagliflozin      hematuria   Ozempic  (0.25 Or 0.5 Mg-Dose) [Semaglutide (0.25 Or 0.5mg -Dos)]     Bloating    Actos  [Pioglitazone ] Rash    I personally reviewed active problem list, medication list, allergies, family history with the patient/caregiver today.   ROS  Constitutional: Negative for fever or weight change.  Respiratory: Negative for cough and shortness of breath.   Cardiovascular: Negative for chest pain or palpitations.  Gastrointestinal: Negative for abdominal pain, no bowel changes.  Musculoskeletal: Negative for gait problem or joint swelling.  Skin: Negative for rash.  Neurological: Negative for dizziness or headache.  No other specific complaints in a complete review of  systems (except as listed in HPI above).   Objective Physical Exam VITALS: BP- 114/74 MEASUREMENTS: Weight- 206, BMI- 32.39. CONSTITUTIONAL: Patient appears well-developed and well-nourished.  No distress. HEENT: Head atraumatic, normocephalic, neck supple. CARDIOVASCULAR: Normal rate, regular rhythm and normal heart sounds.  No murmur heard. No BLE edema. PULMONARY: Effort normal and breath sounds normal. No respiratory distress. ABDOMINAL: There is no tenderness or distention. MUSCULOSKELETAL: Normal gait. Without gross motor or sensory deficit. PSYCHIATRIC: Patient has a normal mood and affect. behavior is normal. Judgment and thought content normal.  Vitals:   04/14/24 0934  BP: 114/74  Pulse: 95  Resp: 16  SpO2: 97%  Weight: 206 lb 12.8 oz (93.8 kg)  Height: 5' 7 (1.702 m)    Body mass index is 32.39 kg/m.  Recent Results (from the past 2160 hours)  POCT glycosylated hemoglobin (Hb A1C)     Status: Abnormal   Collection Time: 04/14/24  9:43 AM  Result Value Ref Range   Hemoglobin A1C 5.8 (A) 4.0 - 5.6 %   HbA1c POC (<> result, manual entry)     HbA1c, POC (prediabetic range)     HbA1c, POC (controlled diabetic range)      Diabetic Foot Exam:     PHQ2/9:    04/14/2024    9:24 AM 01/09/2024    9:22 AM 10/03/2023    9:08 AM 07/01/2023    1:20 PM 04/17/2023   11:13 AM  Depression screen PHQ 2/9  Decreased Interest 0 0 0 0 1  Down, Depressed, Hopeless 0 0 0 0 0  PHQ - 2 Score 0 0 0 0 1  Altered sleeping 0 0 0 0 0  Tired, decreased energy 0 0 0 0 0  Change in appetite 0 0 0 0 0  Feeling bad or failure about yourself  0 0 0 0 0  Trouble concentrating 0 0 0 0 0  Moving slowly or fidgety/restless 0 0 0 0 0  Suicidal thoughts 0 0 0 0 0  PHQ-9 Score 0 0 0 0 1  Difficult doing work/chores Not difficult at all Not difficult at all Not difficult at all Not difficult at all Not difficult at all    phq 9 is negative  Fall Risk:    04/14/2024  9:24 AM 01/09/2024     9:15 AM 07/01/2023    1:20 PM 04/17/2023   11:10 AM 01/14/2023    7:46 AM  Fall Risk   Falls in the past year? 0 0 0 0 1  Number falls in past yr: 0 0 0  1  Injury with Fall? 0 0 0  0  Risk for fall due to : No Fall Risks No Fall Risks No Fall Risks No Fall Risks History of fall(s)  Follow up Falls evaluation completed Falls prevention discussed;Education provided;Falls evaluation completed Falls prevention discussed;Education provided;Falls evaluation completed Falls prevention discussed Falls prevention discussed;Education provided;Falls evaluation completed     Assessment & Plan Type 2 diabetes mellitus with complications including peripheral neuropathy, dyslipidemia and HTN  Diabetes well-controlled with improved A1c from 6.4 to 5.8. Experiencing daily hypoglycemia likely due to meal and insulin  timing. Peripheral neuropathy symptoms improved with pregabalin . - Reduce Toujeo  dose to 40 units daily. - Maintain Mounjaro  at 10 mg weekly. - Continue metformin , Humalog , and pregabalin . - Monitor blood glucose, especially upon waking and before meals. - Adjust pre-meal insulin  timing to 20 minutes before meals. - Refill prescriptions for metformin , Mounjaro , Humalog , and Toujeo . - Check labs for kidney function, liver function, and lipid panel.  Hypertension Hypertension well-controlled with valsartan . Blood pressure at low end of normal without symptoms. - Continue valsartan  40 mg daily.  Dyslipidemia Dyslipidemia well-managed with rosuvastatin . LDL at 45 mg/dL. - Continue rosuvastatin . - Check lipid panel with labs.  Obesity Obesity managed with lifestyle modifications. Weight decreased from 209 lbs to 206 lbs. BMI 32.39. - Continue dietary modifications and weight management strategies.  Obstructive sleep apnea Obstructive sleep apnea managed with CPAP therapy. - Continue CPAP therapy nightly.  Gastroesophageal reflux disease GERD well-controlled with pantoprazole . - Continue  pantoprazole .  Herpes labialis Recent flare-up after long period without symptoms. Has medication for outbreaks. - Prescribe medication for onset of symptoms. - Send prescription to [facility].  Vitamin D  deficiency Vitamin D  deficiency managed with supplements. - Continue over-the-counter vitamin D  supplementation. - Check vitamin D  levels with labs.  Vitamin B12 deficiency Vitamin B12 deficiency managed with sublingual supplements. Previous levels low. - Continue sublingual vitamin B12 supplementation. - Check B12 levels with labs.  Depression Not experiencing major depressive symptoms. Stopped Zoloft  and Abilify . Advised to monitor for signs, especially in winter. - Monitor for signs of depression, especially in winter. - Consider resuming Zoloft  if symptoms arise.

## 2024-04-16 LAB — COMPREHENSIVE METABOLIC PANEL WITH GFR
ALT: 13 IU/L (ref 0–32)
AST: 14 IU/L (ref 0–40)
Albumin: 4.4 g/dL (ref 3.9–4.9)
Alkaline Phosphatase: 105 IU/L (ref 49–135)
BUN/Creatinine Ratio: 15 (ref 12–28)
BUN: 17 mg/dL (ref 8–27)
Bilirubin Total: 0.2 mg/dL (ref 0.0–1.2)
CO2: 22 mmol/L (ref 20–29)
Calcium: 9.3 mg/dL (ref 8.7–10.3)
Chloride: 105 mmol/L (ref 96–106)
Creatinine, Ser: 1.17 mg/dL — ABNORMAL HIGH (ref 0.57–1.00)
Globulin, Total: 2.6 g/dL (ref 1.5–4.5)
Glucose: 107 mg/dL — ABNORMAL HIGH (ref 70–99)
Potassium: 4.4 mmol/L (ref 3.5–5.2)
Sodium: 142 mmol/L (ref 134–144)
Total Protein: 7 g/dL (ref 6.0–8.5)
eGFR: 53 mL/min/1.73 — ABNORMAL LOW

## 2024-04-16 LAB — CBC WITH DIFFERENTIAL/PLATELET
Basophils Absolute: 0 x10E3/uL (ref 0.0–0.2)
Basos: 0 %
EOS (ABSOLUTE): 0.1 x10E3/uL (ref 0.0–0.4)
Eos: 2 %
Hematocrit: 39.1 % (ref 34.0–46.6)
Hemoglobin: 12.5 g/dL (ref 11.1–15.9)
Immature Grans (Abs): 0 x10E3/uL (ref 0.0–0.1)
Immature Granulocytes: 0 %
Lymphocytes Absolute: 2 x10E3/uL (ref 0.7–3.1)
Lymphs: 29 %
MCH: 26.5 pg — ABNORMAL LOW (ref 26.6–33.0)
MCHC: 32 g/dL (ref 31.5–35.7)
MCV: 83 fL (ref 79–97)
Monocytes Absolute: 0.5 x10E3/uL (ref 0.1–0.9)
Monocytes: 7 %
Neutrophils Absolute: 4.5 x10E3/uL (ref 1.4–7.0)
Neutrophils: 62 %
Platelets: 337 x10E3/uL (ref 150–450)
RBC: 4.72 x10E6/uL (ref 3.77–5.28)
RDW: 14 % (ref 11.7–15.4)
WBC: 7.1 x10E3/uL (ref 3.4–10.8)

## 2024-04-16 LAB — VITAMIN D 25 HYDROXY (VIT D DEFICIENCY, FRACTURES): Vit D, 25-Hydroxy: 26.1 ng/mL — ABNORMAL LOW (ref 30.0–100.0)

## 2024-04-16 LAB — MICROALBUMIN / CREATININE URINE RATIO
Creatinine, Urine: 61.9 mg/dL
Microalb/Creat Ratio: 6 mg/g{creat} (ref 0–29)
Microalbumin, Urine: 3.7 ug/mL

## 2024-04-16 LAB — B12 AND FOLATE PANEL
Folate: 4.8 ng/mL
Vitamin B-12: 515 pg/mL (ref 232–1245)

## 2024-04-16 LAB — LIPID PANEL
Chol/HDL Ratio: 2.2 ratio (ref 0.0–4.4)
Cholesterol, Total: 108 mg/dL (ref 100–199)
HDL: 49 mg/dL
LDL Chol Calc (NIH): 42 mg/dL (ref 0–99)
Triglycerides: 84 mg/dL (ref 0–149)
VLDL Cholesterol Cal: 17 mg/dL (ref 5–40)

## 2024-04-19 ENCOUNTER — Ambulatory Visit: Payer: Self-pay | Admitting: Family Medicine

## 2024-04-19 ENCOUNTER — Other Ambulatory Visit: Payer: Self-pay | Admitting: Family Medicine

## 2024-04-19 DIAGNOSIS — G4700 Insomnia, unspecified: Secondary | ICD-10-CM

## 2024-05-07 ENCOUNTER — Other Ambulatory Visit: Payer: Self-pay | Admitting: Family Medicine

## 2024-05-07 DIAGNOSIS — Z1231 Encounter for screening mammogram for malignant neoplasm of breast: Secondary | ICD-10-CM

## 2024-06-15 ENCOUNTER — Other Ambulatory Visit: Payer: Self-pay | Admitting: Family Medicine

## 2024-06-17 NOTE — Telephone Encounter (Signed)
 Requested Prescriptions  Pending Prescriptions Disp Refills   Continuous Glucose Sensor (DEXCOM G7 SENSOR) MISC [Pharmacy Med Name: DEXCOM_G7 SENSOR] 9 each 2    Sig: USE 1 SENSOR AS DIRECTED . SEE  ADMIN INSTRUCTIONS     Endocrinology: Diabetes - Testing Supplies Passed - 06/17/2024  2:08 PM      Passed - Valid encounter within last 12 months    Recent Outpatient Visits           2 months ago Dyslipidemia associated with type 2 diabetes mellitus [facility])   [place] [facility] Glenard Mire, MD   5 months ago Diabetes mellitus with microalbuminuria [facility])   [place] [facility] Sowles, Krichna, MD   8 months ago Diabetes mellitus with microalbuminuria [facility])   [place] [facility] Sowles, Krichna, MD

## 2024-06-21 ENCOUNTER — Ambulatory Visit
Admission: RE | Admit: 2024-06-21 | Discharge: 2024-06-21 | Disposition: A | Discharge status: Home / Self Care | Source: Ambulatory Visit | Attending: Family Medicine | Admitting: Family Medicine

## 2024-06-21 DIAGNOSIS — Z1231 Encounter for screening mammogram for malignant neoplasm of breast: Secondary | ICD-10-CM | POA: Insufficient documentation

## 2024-06-29 LAB — OPHTHALMOLOGY REPORT-SCANNED

## 2024-07-06 ENCOUNTER — Other Ambulatory Visit: Payer: Self-pay | Admitting: Family Medicine

## 2024-07-06 DIAGNOSIS — K219 Gastro-esophageal reflux disease without esophagitis: Secondary | ICD-10-CM

## 2024-07-06 DIAGNOSIS — J302 Other seasonal allergic rhinitis: Secondary | ICD-10-CM

## 2024-07-08 NOTE — Telephone Encounter (Signed)
 Requested Prescriptions  Pending Prescriptions Disp Refills   pantoprazole  (PROTONIX ) 40 MG tablet [Pharmacy Med Name: Pantoprazole  Sodium 40 MG Oral Tablet Delayed Release] 90 tablet 0    Sig: TAKE 1 TABLET BY MOUTH DAILY     Gastroenterology: Proton Pump Inhibitors Passed - 07/08/2024  3:04 PM      Passed - Valid encounter within last 12 months    Recent Outpatient Visits           2 months ago Dyslipidemia associated with type 2 diabetes mellitus [facility])   [place] [facility] Glenard Mire, MD   6 months ago Diabetes mellitus with microalbuminuria [facility])   [place] [facility] [place], Mire, MD   9 months ago Diabetes mellitus with microalbuminuria [facility])   [place] [facility] [place], Krichna, MD              Refused Prescriptions Disp Refills   montelukast  (SINGULAIR ) 10 MG tablet [Pharmacy Med Name: Montelukast  Sodium 10 MG Oral Tablet] 90 tablet 3    Sig: TAKE 1 TABLET BY MOUTH AT  BEDTIME     Pulmonology:  Leukotriene Inhibitors Passed - 07/08/2024  3:04 PM      Passed - Valid encounter within last 12 months    Recent Outpatient Visits           2 months ago Dyslipidemia associated with type 2 diabetes mellitus [facility])   [place] [facility] Glenard Mire, MD   6 months ago Diabetes mellitus with microalbuminuria [facility])   [place] [facility] Glenard Mire, MD   9 months ago Diabetes mellitus with microalbuminuria [facility])   [facility] [place] [facility] [place] Sowles, Krichna, MD

## 2024-07-19 ENCOUNTER — Encounter: Payer: Self-pay | Admitting: Family Medicine

## 2024-07-19 ENCOUNTER — Ambulatory Visit: Admitting: Family Medicine

## 2024-07-19 VITALS — BP 124/76 | HR 97 | Resp 16 | Ht 67.0 in | Wt 206.4 lb

## 2024-07-19 DIAGNOSIS — G4733 Obstructive sleep apnea (adult) (pediatric): Secondary | ICD-10-CM | POA: Diagnosis not present

## 2024-07-19 DIAGNOSIS — E785 Hyperlipidemia, unspecified: Secondary | ICD-10-CM

## 2024-07-19 DIAGNOSIS — M5416 Radiculopathy, lumbar region: Secondary | ICD-10-CM | POA: Diagnosis not present

## 2024-07-19 DIAGNOSIS — I152 Hypertension secondary to endocrine disorders: Secondary | ICD-10-CM | POA: Diagnosis not present

## 2024-07-19 DIAGNOSIS — N1831 Chronic kidney disease, stage 3a: Secondary | ICD-10-CM | POA: Diagnosis not present

## 2024-07-19 DIAGNOSIS — E1169 Type 2 diabetes mellitus with other specified complication: Secondary | ICD-10-CM

## 2024-07-19 DIAGNOSIS — Z794 Long term (current) use of insulin: Secondary | ICD-10-CM | POA: Diagnosis not present

## 2024-07-19 DIAGNOSIS — E1142 Type 2 diabetes mellitus with diabetic polyneuropathy: Secondary | ICD-10-CM | POA: Diagnosis not present

## 2024-07-19 DIAGNOSIS — E1159 Type 2 diabetes mellitus with other circulatory complications: Secondary | ICD-10-CM | POA: Diagnosis not present

## 2024-07-19 DIAGNOSIS — E1122 Type 2 diabetes mellitus with diabetic chronic kidney disease: Secondary | ICD-10-CM

## 2024-07-19 DIAGNOSIS — K219 Gastro-esophageal reflux disease without esophagitis: Secondary | ICD-10-CM | POA: Diagnosis not present

## 2024-07-19 DIAGNOSIS — G4700 Insomnia, unspecified: Secondary | ICD-10-CM

## 2024-07-19 MED ORDER — PREGABALIN 100 MG PO CAPS: 100.0000 mg | ORAL_CAPSULE | Freq: Three times a day (TID) | ORAL | 0 refills | Status: AC

## 2024-07-19 MED ORDER — VITAMIN D3 50 MCG (2000 UT) PO CAPS: 2000.0000 [IU] | ORAL_CAPSULE | Freq: Every day | ORAL | 1 refills | Status: AC

## 2024-07-19 MED ORDER — TEMAZEPAM 30 MG PO CAPS: 30.0000 mg | ORAL_CAPSULE | Freq: Every day | ORAL | 0 refills | Status: AC

## 2024-07-19 NOTE — Progress Notes (Signed)
 Name: Joyce Blackburn   MRN: [id number]    DOB: May 08, 1961   Date:07/19/2024       Progress Note  Subjective  Chief Complaint  Chief Complaint  Patient presents with   Medical Management of Chronic Issues   Discussed the use of AI scribe software for clinical note transcription with the patient, who gave verbal consent to proceed.  History of Present Illness Joyce Blackburn is a 63 year old female with chronic low back pain and type 2 diabetes who presents with worsening sciatica symptoms.  She has experienced low back pain since 2019, which worsens in cold weather. The pain is constant, affects her gait, and causes a limp. It radiates down her right leg and has persisted since 2022. She has not pursued injections or surgery, opting for home exercises and chiropractic care. She previously used prednisone  and currently finds Lyrica  helpful, occasionally taking Tylenol . An MRI in 2022 revealed retrolisthesis at L3-L4 and L4-L5, arthritis, and a bulging disc at L5-S1.  Her type 2 diabetes is managed with a continuous glucose monitor. Her last A1c was 5.8% in September, with a 90-day average of 6%. She uses Toujeo  at 44 units, Humalog  quick pen at 6 units in the morning, and Mounjaro  10 mg weekly, along with metformin  twice daily. She is lactose intolerant and struggles with breakfast options.  Date of Download: 07/19/2024 % Time CGM is active: 96% Average Glucose: 120 mg/dL Glucose Management Indicator: Dexcom  Glucose Variability: 35  (goal <36%) Time in Goal:  - Time in range 70-180: 93% - Time above range: 6% - Time below range: 2%   She has hypertension, managed with valsartan  40 mg, and dyslipidemia, managed with rosuvastatin  40 mg. Her blood pressure was 124/76, and her last LDL was 42. She also has chronic kidney disease stage 3A, with an EGFR of 53.  She experiences insomnia and reports that temazepam  helps her sleep. She uses a CPAP machine for obstructive sleep apnea and takes  medication for reflux. No bowel or bladder incontinence related to her back pain. No chest pain, palpitations, shortness of breath, or reflux symptoms.    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   Lumbar back pain with radiculopathy affecting right lower extremity 12/26/2021   Sciatic nerve pain, right 12/26/2021   Major depression in remission 12/19/2017   Fatty liver 03/04/2016   Vitamin D  deficiency 03/04/2016   Gastroesophageal reflux disease without esophagitis 07/04/2015   Peripheral neuropathy 03/03/2015   Allergic to bees 02/20/2015   Benign hypertension 02/20/2015   Insomnia, persistent 02/20/2015   Chronic idiopathic constipation 02/20/2015   Dyslipidemia 02/20/2015   Dermatitis, eczematoid 02/20/2015   H/O gastric ulcer 02/20/2015   Herpes 02/20/2015   Migraine without aura and without status migrainosus, not intractable 02/20/2015   OSA on CPAP 02/20/2015   Paresthesia of arm 02/20/2015   Periodic limb movement 02/20/2015   Allergic rhinitis, seasonal 02/20/2015   Menopausal symptom 02/20/2015    Past Surgical History:  Procedure Laterality Date   ABDOMINAL HYSTERECTOMY     COLONOSCOPY WITH PROPOFOL  N/A 05/26/2020   Procedure: COLONOSCOPY WITH PROPOFOL ;  Surgeon: Therisa Bi, MD;  Location: [facility] ENDOSCOPY;  Service: Gastroenterology;  Laterality: N/A;   RETINAL TEAR REPAIR CRYOTHERAPY Right    [facility] (Dr. Nadyne)  Family History  Problem Relation Age of Onset   Mental illness Mother    Diabetes Mother    CVA Mother    Schizophrenia Mother    Diabetes Sister    Hypertension Sister    Diabetes Brother    Mental illness Brother    Bipolar disorder Brother    Schizophrenia Brother     Social History   Tobacco Use    Smoking status: Former    Current packs/day: 0.00    Average packs/day: 0.1 packs/day for 15.0 years (1.5 ttl pk-yrs)    Types: Cigarettes    Start date: 07/29/1989    Quit date: 07/29/2004    Years since quitting: 19.9   Smokeless tobacco: Never  Substance Use Topics   Alcohol  use: No    Alcohol /week: 0.0 standard drinks of alcohol     Current Medications[1]  Allergies[2]  I personally reviewed active problem list, medication list, allergies, family history with the patient/caregiver today.   ROS  Ten systems reviewed and is negative except as mentioned in HPI    Objective Physical Exam  CONSTITUTIONAL: Patient appears well-developed and well-nourished. No distress. HEENT: Head atraumatic, normocephalic, neck supple. CARDIOVASCULAR: Normal rate, regular rhythm and normal heart sounds. No murmur heard. No BLE edema. Extremities normal. PULMONARY: Effort normal and breath sounds normal. No respiratory distress. MUSCULOSKELETAL: slow gait . Without gross motor or sensory deficit. PSYCHIATRIC: Patient has a normal mood and affect. Behavior is normal. Judgment and thought content normal.  Vitals:   07/19/24 1256  BP: 124/76  Pulse: 97  Resp: 16  SpO2: 96%  Weight: 206 lb 6.4 oz (93.6 kg)  Height: 5' 7 (1.702 m)    Body mass index is 32.33 kg/m.   PHQ2/9:    07/19/2024   12:51 PM 04/14/2024    9:24 AM 01/09/2024    9:22 AM 10/03/2023    9:08 AM 07/01/2023    1:20 PM  Depression screen PHQ 2/9  Decreased Interest 0 0 0 0 0  Down, Depressed, Hopeless 0 0 0 0 0  PHQ - 2 Score 0 0 0 0 0  Altered sleeping 0 0 0 0 0  Tired, decreased energy 0 0 0 0 0  Change in appetite 0 0 0 0 0  Feeling bad or failure about yourself  0 0 0 0 0  Trouble concentrating 0 0 0 0 0  Moving slowly or fidgety/restless 0 0 0 0 0  Suicidal thoughts 0 0 0 0 0  PHQ-9 Score 0 0  0  0  0   Difficult doing work/chores Not difficult at all Not difficult at all Not difficult at all Not difficult at  all Not difficult at all     Data saved with a previous flowsheet row definition    phq 9 is negative  Fall Risk:    07/19/2024   12:51 PM 04/14/2024    9:24 AM 01/09/2024    9:15 AM 07/01/2023    1:20 PM 04/17/2023   11:10 AM  Fall Risk   Falls in the past year? 0 0 0 0 0  Number falls in past yr: 0 0 0 0   Injury with Fall? 0 0  0  0    Risk for fall due to : No Fall Risks No Fall Risks No Fall Risks No Fall Risks No Fall Risks  Follow up Falls evaluation completed Falls evaluation completed Falls prevention discussed;Education provided;Falls evaluation completed Falls prevention discussed;Education provided;Falls evaluation completed Falls prevention  discussed     Data saved with a previous flowsheet row definition    Assessment & Plan Lumbar radiculopathy affecting right lower extremity Chronic low back pain with radiculopathy since 2019, exacerbated by cold weather. MRI in 2022 showed retrolisthesis at L3-L4, L4-L5, arthritis, and bulging disc at L5-S1 causing nerve compression. Prefers non-surgical management due to financial constraints and quality of life considerations. Injections discussed as a temporary measure to avoid surgery, but she is hesitant due to fear of needles. - Increased Lyrica  to 100 mg twice daily, with option to increase to three times daily if needed. - Provided temporary handicap sticker for 6 months  - Continue chiropractic care and home exercises.  Type 2 diabetes mellitus with stage 3a chronic kidney disease, diabetic polyneuropathy, dyslipidemia, and hypertension Diabetes well-controlled with A1c at 6.0% and 90-day average glucose of 111 mg/dL. Complications include diabetic polyneuropathy, dyslipidemia, and hypertension. Stage 3a chronic kidney disease with EGFR of 53. Discussed potential use of Omnipod for insulin  delivery to simplify regimen. Emphasized importance of blood sugar and blood pressure control to prevent further kidney damage. - Referred to  pharmacist for Omnipod training and insurance approval. - Continue current diabetes medications: Mounjaro , Toujeo , and metformin . - Monitor blood sugar and blood pressure regularly. - Avoid NSAIDs like Aleve and Imitrex. - Stay hydrated.  Obstructive sleep apnea Managed with CPAP therapy. - Continue CPAP therapy.  Insomnia Managed with temazepam , which aids in falling and staying asleep. - Continue temazepam  for insomnia.  Gastroesophageal reflux disease Well-controlled with current medication regimen. - Continue current reflux medication.  Vitamin D  deficiency Previous low levels. Not currently taking supplements. - Start vitamin D  2000 IU daily. - Purchase vitamin D  supplements from a artist.        [1]  Current Outpatient Medications:    Alcohol  Swabs (ALCOHOL  PREP) 70 % PADS, USE 3 TIMES DAILY AS NEEDED, Disp: 300 each, Rfl: 1   aspirin 81 MG tablet, Take 81 mg by mouth daily., Disp: , Rfl:    Cholecalciferol (VITAMIN D3) 1.25 MG (50000 UT) capsule, Take 1 capsule (50,000 Units total) by mouth every 7 (seven) days., Disp: 12 capsule, Rfl: 1   Continuous Glucose Sensor (DEXCOM G7 SENSOR) MISC, USE 1 SENSOR AS DIRECTED . SEE  ADMIN INSTRUCTIONS, Disp: 9 each, Rfl: 2   CONTOUR NEXT TEST test strip, USE AS DIRECTED ONCE DAILY, Disp: 100 strip, Rfl: 3   EPINEPHrine  (EPIPEN  2-PAK) 0.3 mg/0.3 mL IJ SOAJ injection, Inject 0.3 mg into the muscle as needed for anaphylaxis., Disp: 2 each, Rfl: 0   fluticasone  (FLONASE ) 50 MCG/ACT nasal spray, Place 2 sprays into both nostrils daily., Disp: 16 g, Rfl: 0   Glucagon  (GVOKE HYPOPEN  1-PACK) 1 MG/0.2ML SOAJ, Inject 1 mg into the skin as needed (for hypoglycemic episode)., Disp: 0.2 mL, Rfl: 3   insulin  lispro (HUMALOG  KWIKPEN) 100 UNIT/ML KwikPen, INJECT SUBCUTANEOUSLY PER FSBS  CHECK PRIOR TO MEALS. HOLD IF  BELOW 110, 110-140 = 4U,  140-180= 6U, 180-220= 8U, IF  GLUCOSE ABOVE 220= 10U, Disp: 30 mL, Rfl: 3   loratadine   (CLARITIN ) 10 MG tablet, Take 1 tablet (10 mg total) by mouth daily., Disp: 30 tablet, Rfl: 11   metFORMIN  (GLUCOPHAGE -XR) 750 MG 24 hr tablet, Take 2 tablets (1,500 mg total) by mouth daily., Disp: 180 tablet, Rfl: 1   montelukast  (SINGULAIR ) 10 MG tablet, TAKE 1 TABLET BY MOUTH AT  BEDTIME, Disp: 90 tablet, Rfl: 3   MOUNJARO  10 MG/0.5ML Pen, INJECT THE CONTENTS  OF ONE PEN  SUBCUTANEOUSLY WEEKLY AS  DIRECTED, Disp: 6 mL, Rfl: 3   NOVOFINE PEN NEEDLE 32G X 6 MM MISC, USE TO INJECT INTO THE SKIN  TWICE DAILY, Disp: 180 each, Rfl: 0   pantoprazole  (PROTONIX ) 40 MG tablet, TAKE 1 TABLET BY MOUTH DAILY, Disp: 90 tablet, Rfl: 0   pregabalin  (LYRICA ) 75 MG capsule, Take 1 capsule (75 mg total) by mouth 2 (two) times daily., Disp: 180 capsule, Rfl: 1   rosuvastatin  (CRESTOR ) 40 MG tablet, TAKE 1 TABLET BY MOUTH DAILY FOR CHOLESTEROL, Disp: 90 tablet, Rfl: 3   sertraline  (ZOLOFT ) 50 MG tablet, Take 1 tablet (50 mg total) by mouth daily., Disp: 30 tablet, Rfl: 0   temazepam  (RESTORIL ) 30 MG capsule, TAKE 1 CAPSULE BY MOUTH DAILY, Disp: 90 capsule, Rfl: 0   TOUJEO  MAX SOLOSTAR 300 UNIT/ML Solostar Pen, INJECT 48 UNITS INTO THE SKIN  DAILY AT 12 NOON, Disp: 18 mL, Rfl: 3   valACYclovir  (VALTREX ) 1000 MG tablet, Take 1 tablet (1,000 mg total) by mouth 2 (two) times daily. Prn, Disp: 20 tablet, Rfl: 0   valsartan  (DIOVAN ) 40 MG tablet, Take 1 tablet (40 mg total) by mouth daily., Disp: 90 tablet, Rfl: 1 [2]  Allergies Allergen Reactions   Ace Inhibitors     cough   Bee Venom     Bee   Dapagliflozin      hematuria   Ozempic  (0.25 Or 0.5 Mg-Dose) [Semaglutide (0.25 Or 0.5mg -Dos)]     Bloating    Actos  [Pioglitazone ] Rash

## 2024-08-02 ENCOUNTER — Telehealth: Payer: Self-pay

## 2024-08-02 NOTE — Progress Notes (Signed)
 Complex Care Management Note  Care Guide Note 08/02/2024 Name: Joyce Blackburn MRN: [id number] DOB: 18-Nov-1960  Joyce Blackburn is a 64 y.o. year old female who sees Sowles, Krichna, MD for primary care. I reached out to Joyce Blackburn by phone today to offer complex care management services.  Ms. Caulfield was given information about Complex Care Management services today including:   The Complex Care Management services include support from the care team which includes your Nurse Care Manager, Clinical Social Worker, or Pharmacist.  The Complex Care Management team is here to help remove barriers to the health concerns and goals most important to you. Complex Care Management services are voluntary, and the patient may decline or stop services at any time by request to their care team member.   Complex Care Management Consent Status: Patient agreed to services and verbal consent obtained.   Follow up plan:  Telephone appointment with complex care management team member scheduled for:  08/16/24 at 11:00 a.m.   Encounter Outcome:  Patient Scheduled  [facility]  [facility], [facility] VBCI Assistant Direct Dial : [phone]  Fax: [phone]

## 2024-08-10 ENCOUNTER — Other Ambulatory Visit: Payer: Self-pay | Admitting: Family Medicine

## 2024-08-10 DIAGNOSIS — E1169 Type 2 diabetes mellitus with other specified complication: Secondary | ICD-10-CM

## 2024-08-12 NOTE — Telephone Encounter (Signed)
 Requested Prescriptions  Pending Prescriptions Disp Refills   rosuvastatin  (CRESTOR ) 40 MG tablet [Pharmacy Med Name: Rosuvastatin  Calcium  40 MG Oral Tablet] 90 tablet 0    Sig: TAKE 1 TABLET BY MOUTH DAILY FOR CHOLESTEROL     Cardiovascular:  Antilipid - Statins 2 Failed - 08/12/2024 10:12 AM      Failed - Cr in normal range and within 360 days    Creat  Date Value Ref Range Status  04/17/2023 1.11 (H) 0.50 - 1.05 mg/dL Final   Creatinine, Ser  Date Value Ref Range Status  04/15/2024 1.17 (H) 0.57 - 1.00 mg/dL Final   Creatinine, Urine  Date Value Ref Range Status  04/17/2023 157 20 - 275 mg/dL Final         Failed - Lipid Panel in normal range within the last 12 months    Cholesterol, Total  Date Value Ref Range Status  04/15/2024 108 100 - 199 mg/dL Final   LDL Cholesterol (Calc)  Date Value Ref Range Status  04/17/2023 45 mg/dL (calc) Final    Comment:    Reference range: <100 . Desirable range <100 mg/dL for primary prevention;   <70 mg/dL for patients with CHD or diabetic patients  with > or = 2 CHD risk factors. SABRA LDL-C is now calculated using the Martin-Hopkins  calculation, which is a validated novel method providing  better accuracy than the Friedewald equation in the  estimation of LDL-C.  Gladis APPLETHWAITE et al. SANDREA. 7986;689(80): 2061-2068  (http://education.[employer].com/faq/FAQ164)    LDL Chol Calc (NIH)  Date Value Ref Range Status  04/15/2024 42 0 - 99 mg/dL Final   HDL  Date Value Ref Range Status  04/15/2024 49 >39 mg/dL Final   Triglycerides  Date Value Ref Range Status  04/15/2024 84 0 - 149 mg/dL Final         Passed - Patient is not pregnant      Passed - Valid encounter within last 12 months    Recent Outpatient Visits           3 weeks ago Lumbar back pain with radiculopathy affecting right lower extremity   [place] [facility] Glenard Mire, MD   4 months ago Dyslipidemia associated with type 2 diabetes  mellitus [facility])   [place] [facility] Glenard Mire, MD   7 months ago Diabetes mellitus with microalbuminuria [facility])   [place] [facility] Sowles, Krichna, MD   10 months ago Diabetes mellitus with microalbuminuria [facility])   [facility] Sowles, Krichna, MD

## 2024-08-16 ENCOUNTER — Other Ambulatory Visit: Payer: Self-pay

## 2024-08-16 ENCOUNTER — Other Ambulatory Visit (INDEPENDENT_AMBULATORY_CARE_PROVIDER_SITE_OTHER)

## 2024-08-16 DIAGNOSIS — E1122 Type 2 diabetes mellitus with diabetic chronic kidney disease: Secondary | ICD-10-CM

## 2024-08-16 DIAGNOSIS — N1831 Chronic kidney disease, stage 3a: Secondary | ICD-10-CM

## 2024-08-16 DIAGNOSIS — Z794 Long term (current) use of insulin: Secondary | ICD-10-CM

## 2024-08-16 DIAGNOSIS — E1169 Type 2 diabetes mellitus with other specified complication: Secondary | ICD-10-CM

## 2024-08-16 DIAGNOSIS — E785 Hyperlipidemia, unspecified: Secondary | ICD-10-CM

## 2024-08-16 MED ORDER — INSULIN LISPRO 100 UNIT/ML IJ SOLN: INTRAMUSCULAR | 11 refills | Status: AC

## 2024-08-16 MED ORDER — OMNIPOD 5 DEXG7G6 INTRO GEN 5 KIT: PACK | 0 refills | Status: AC

## 2024-08-16 MED ORDER — TOUJEO MAX SOLOSTAR 300 UNIT/ML ~~LOC~~ SOPN: 40.0000 [IU] | PEN_INJECTOR | Freq: Every day | SUBCUTANEOUS | Status: AC

## 2024-08-16 MED ORDER — OMNIPOD 5 DEXG7G6 PODS GEN 5 MISC: 5 refills | Status: AC

## 2024-08-16 NOTE — Progress Notes (Signed)
 "  S:     Reason for visit: ?  Joyce Blackburn is a 64 y.o. female with a history of diabetes (type 2), who presents today for an initial diabetes Telephone pharmacotherapy visit.? Pertinent PMH also includes HTN, migraines, GERD, HLD, depression, obesity.  They were referred to the pharmacist by their PCP for assistance in managing diabetes.  Care Team: Primary Care Provider: Sowles, Krichna, MD  Current diabetes medications include: Toujeo  44 units daily, Mounjaro  10 mg weekly, metformin  XR 750 mg 2 tablets daily, Humalog  7 units daily   Previous diabetes medications include: Farxiga  (hematuria), Victoza  (bloating, pioglitazone  (rash) Current hypertension medications include: valsartan  40 mg daily  Current hyperlipidemia medications include: rosuvastatin  40 mg daily   Patient reports adherence to taking all medications as prescribed.   Have you been experiencing any side effects to the medications prescribed? no Do you have any problems obtaining medications due to transportation or finances? no Insurance coverage: [employer] Managed   Patient reports some overnight hypoglycemic events.  Patient reported dietary habits: Eats 2-3 meals/day Breakfast: veggie and chicken quiche Lunch: veggie rice bowl Dinner: crackers  Patient-reported exercise habits: none reported DM Prevention:  Statin: Taking; high intensity.?  ACE/ARB: yes; valsartan  Last urinary albumin/creatinine ratio:  Lab Results  Component Value Date   MICRALBCREAT 6 04/15/2024   MICRALBCREAT 34 (H) 04/17/2023   MICRALBCREAT 4 03/29/2022   MICRALBCREAT 4 10/02/2020   MICRALBCREAT 3 09/29/2019   Last eye exam:  Lab Results  Component Value Date   HMDIABEYEEXA No Retinopathy 06/29/2024   Lab Results  Component Value Date   HMDIABEYEEXA No Retinopathy 06/29/2024   Last foot exam: No foot exam found Tobacco Use:  Tobacco Use: Medium Risk (07/19/2024)   Patient History    Smoking Tobacco Use: Former     Smokeless Tobacco Use: Never    Passive Exposure: Not on file   O:  Dexcom Clarity Report    Vitals:  Wt Readings from Last 3 Encounters:  07/19/24 206 lb 6.4 oz (93.6 kg)  04/14/24 206 lb 12.8 oz (93.8 kg)  01/09/24 208 lb 9.6 oz (94.6 kg)   BP Readings from Last 3 Encounters:  07/19/24 124/76  04/14/24 114/74  01/09/24 114/70   Pulse Readings from Last 3 Encounters:  07/19/24 97  04/14/24 95  01/09/24 92     Labs:?  Lab Results  Component Value Date   HGBA1C 5.8 (A) 04/14/2024   HGBA1C 6.4 (A) 01/09/2024   HGBA1C 6.3 (A) 10/03/2023   GLUCOSE 107 (H) 04/15/2024   MICRALBCREAT 6 04/15/2024   MICRALBCREAT 34 (H) 04/17/2023   MICRALBCREAT 4 03/29/2022   CREATININE 1.17 (H) 04/15/2024   CREATININE 1.11 (H) 04/17/2023   CREATININE 0.98 03/29/2022    Lab Results  Component Value Date   CHOL 108 04/15/2024   LDLCALC 42 04/15/2024   LDLCALC 45 04/17/2023   LDLCALC 49 03/29/2022   HDL 49 04/15/2024   TRIG 84 04/15/2024   TRIG 94 04/17/2023   TRIG 109 03/29/2022   ALT 13 04/15/2024   ALT 15 04/17/2023   AST 14 04/15/2024   AST 22 04/17/2023      Chemistry      Component Value Date/Time   NA 142 04/15/2024 0722   K 4.4 04/15/2024 0722   CL 105 04/15/2024 0722   CO2 22 04/15/2024 0722   BUN 17 04/15/2024 0722   CREATININE 1.17 (H) 04/15/2024 0722   CREATININE 1.11 (H) 04/17/2023 1156   GLU 326 03/08/2021  0000      Component Value Date/Time   CALCIUM  9.3 04/15/2024 0722   ALKPHOS 105 04/15/2024 0722   AST 14 04/15/2024 0722   ALT 13 04/15/2024 0722   BILITOT 0.2 04/15/2024 0722       The ASCVD Risk score (Arnett DK, et al., 2019) failed to calculate for the following reasons:   The valid total cholesterol range is 130 to 320 mg/dL   Unable to determine if patient is Non-Hispanic African American  Lab Results  Component Value Date   MICRALBCREAT 6 04/15/2024   MICRALBCREAT 34 (H) 04/17/2023   MICRALBCREAT 4 03/29/2022   MICRALBCREAT 4  10/02/2020   MICRALBCREAT 3 09/29/2019    A/P: Diabetes currently controlled with a most recent A1c of 5.8% on 04/14/24. Patient is able to verbalize appropriate hypoglycemia management plan. Medication adherence appears appropriate. Dexcom clarity report shows some overnight hypoglycemia ~2 nights per week. Will reduce basal insulin  dose at this time. Patient is also interested in use of Omnipod insulin  pump. Will send to pharmacy to determine if the cost is feasible on insurance -Decreased dose of basal insulin  Toujeo  (insulin  glargine)  from 44 to 40 units daily.  -Continued rapid insulin  Humalog  (insulin  lispro) 7 units daily with breakfast. -Continued GLP-1 Mounjaro  (tirzepatide )  10 mg weekly .  -Continued metformin  XR 750 mg 2 tablets daily.  -Patient educated on purpose, proper use, and potential adverse effects of Omnipod.  -Extensively discussed pathophysiology of diabetes, recommended lifestyle interventions, dietary effects on blood sugar control.  -Counseled on s/sx of and management of hypoglycemia.  -Sent Omnipod supplies, including Humalog  vial, to the pharmacy. Will coordinate with Omnipod team on cost and potential training  ASCVD risk - primary prevention in patient with diabetes. Last LDL is 42 mg/dL, at goal of <29 mg/dL.  -Continued rosuvastatin  40 mg daily.   Patient verbalized understanding of treatment plan. Total time patient counseling 30 minutes.  Follow-up:  Pharmacist on 09/03/24 PCP clinic visit on 10/22/24  Peyton CHARLENA Ferries, PharmD, BCACP, CPP Clinical Pharmacist [facility] [phone]   "

## 2024-09-03 ENCOUNTER — Other Ambulatory Visit

## 2024-09-06 ENCOUNTER — Other Ambulatory Visit

## 2024-10-22 ENCOUNTER — Ambulatory Visit: Admitting: Family Medicine
